# Patient Record
Sex: Female | Born: 1975 | Race: White | Hispanic: No | Marital: Married | Smoking: Never smoker
Health system: Southern US, Community
[De-identification: ages and names within clinical notes are randomized; demographics above are authoritative.]

## PROBLEM LIST (undated history)

## (undated) DIAGNOSIS — T7840XA Allergy, unspecified, initial encounter: Secondary | ICD-10-CM

## (undated) DIAGNOSIS — F419 Anxiety disorder, unspecified: Secondary | ICD-10-CM

## (undated) DIAGNOSIS — B019 Varicella without complication: Secondary | ICD-10-CM

## (undated) DIAGNOSIS — K219 Gastro-esophageal reflux disease without esophagitis: Secondary | ICD-10-CM

## (undated) HISTORY — DX: Anxiety disorder, unspecified: F41.9

## (undated) HISTORY — DX: Gastro-esophageal reflux disease without esophagitis: K21.9

## (undated) HISTORY — PX: ESOPHAGOGASTRODUODENOSCOPY (EGD) WITH PROPOFOL: SHX5813

## (undated) HISTORY — DX: Allergy, unspecified, initial encounter: T78.40XA

## (undated) HISTORY — DX: Varicella without complication: B01.9

---

## 1999-10-31 ENCOUNTER — Encounter: Payer: Self-pay | Admitting: *Deleted

## 1999-10-31 ENCOUNTER — Emergency Department (HOSPITAL_COMMUNITY): Admission: EM | Admit: 1999-10-31 | Discharge: 1999-10-31 | Payer: Self-pay | Admitting: Podiatry

## 2002-06-11 ENCOUNTER — Other Ambulatory Visit: Admission: RE | Admit: 2002-06-11 | Discharge: 2002-06-11 | Payer: Self-pay | Admitting: Family Medicine

## 2003-07-25 ENCOUNTER — Other Ambulatory Visit: Admission: RE | Admit: 2003-07-25 | Discharge: 2003-07-25 | Payer: Self-pay | Admitting: Family Medicine

## 2004-07-20 ENCOUNTER — Other Ambulatory Visit: Admission: RE | Admit: 2004-07-20 | Discharge: 2004-07-20 | Payer: Self-pay | Admitting: Family Medicine

## 2005-07-26 ENCOUNTER — Other Ambulatory Visit: Admission: RE | Admit: 2005-07-26 | Discharge: 2005-07-26 | Payer: Self-pay | Admitting: Family Medicine

## 2006-07-31 ENCOUNTER — Other Ambulatory Visit: Admission: RE | Admit: 2006-07-31 | Discharge: 2006-07-31 | Payer: Self-pay | Admitting: Family Medicine

## 2007-10-01 ENCOUNTER — Other Ambulatory Visit: Admission: RE | Admit: 2007-10-01 | Discharge: 2007-10-01 | Payer: Self-pay | Admitting: Family Medicine

## 2009-07-04 ENCOUNTER — Observation Stay: Payer: Self-pay

## 2009-07-08 ENCOUNTER — Inpatient Hospital Stay: Payer: Self-pay | Admitting: Obstetrics and Gynecology

## 2011-12-23 ENCOUNTER — Ambulatory Visit: Payer: Self-pay | Admitting: Gastroenterology

## 2011-12-26 LAB — PATHOLOGY REPORT

## 2014-01-15 ENCOUNTER — Ambulatory Visit: Payer: Self-pay | Admitting: Family Medicine

## 2014-12-09 LAB — BASIC METABOLIC PANEL
BUN: 11 mg/dL (ref 4–21)
Creatinine: 0.8 mg/dL (ref 0.5–1.1)
Glucose: 94 mg/dL
Potassium: 4.1 mmol/L (ref 3.4–5.3)
Sodium: 139 mmol/L (ref 137–147)

## 2014-12-09 LAB — HEPATIC FUNCTION PANEL
ALT: 15 U/L (ref 7–35)
AST: 19 U/L (ref 13–35)
Alkaline Phosphatase: 89 U/L (ref 25–125)
Bilirubin, Total: 0.3 mg/dL

## 2014-12-09 LAB — LIPID PANEL
Cholesterol: 154 mg/dL (ref 0–200)
HDL: 54 mg/dL (ref 35–70)
LDL Cholesterol: 86 mg/dL
Triglycerides: 70 mg/dL (ref 40–160)

## 2014-12-09 LAB — TSH: TSH: 2.39 u[IU]/mL (ref 0.41–5.90)

## 2014-12-09 LAB — HEMOGLOBIN A1C: Hgb A1c MFr Bld: 5.4 % (ref 4.0–6.0)

## 2015-06-11 ENCOUNTER — Encounter (INDEPENDENT_AMBULATORY_CARE_PROVIDER_SITE_OTHER): Payer: Self-pay

## 2015-06-11 ENCOUNTER — Encounter: Payer: Self-pay | Admitting: Nurse Practitioner

## 2015-06-11 ENCOUNTER — Ambulatory Visit (INDEPENDENT_AMBULATORY_CARE_PROVIDER_SITE_OTHER): Payer: BLUE CROSS/BLUE SHIELD | Admitting: Nurse Practitioner

## 2015-06-11 VITALS — BP 106/70 | HR 86 | Temp 98.3°F | Resp 14 | Ht 63.0 in | Wt 190.2 lb

## 2015-06-11 DIAGNOSIS — K2100 Gastro-esophageal reflux disease with esophagitis, without bleeding: Secondary | ICD-10-CM

## 2015-06-11 DIAGNOSIS — Z7189 Other specified counseling: Secondary | ICD-10-CM

## 2015-06-11 DIAGNOSIS — Z91048 Other nonmedicinal substance allergy status: Secondary | ICD-10-CM | POA: Diagnosis not present

## 2015-06-11 DIAGNOSIS — Z9109 Other allergy status, other than to drugs and biological substances: Secondary | ICD-10-CM

## 2015-06-11 DIAGNOSIS — Z7689 Persons encountering health services in other specified circumstances: Secondary | ICD-10-CM | POA: Insufficient documentation

## 2015-06-11 NOTE — Progress Notes (Signed)
Pre visit review using our clinic review tool, if applicable. No additional management support is needed unless otherwise documented below in the visit note. 

## 2015-06-11 NOTE — Patient Instructions (Signed)
See you in 6 months or if you need a physical in the next year.   Call us if you need [place] and welcome to [facility]!

## 2015-06-11 NOTE — Progress Notes (Signed)
Subjective:    Patient ID: Jaime Tate, female    DOB: 1976/02/28, 39 y.o.   MRN: [id number]  HPI  Jaime Tate is a 39 yo female establishing care. No acute CC, but following her chronic illnesses since her PCP has left practice in [place].   1) New pt info:   Immunizations- 2010 tdap  Mammogram- 12/2013 baseline normal- dense breast tissue, [facility]GM- breast cancer at age 24   Pap- Jan. 2016 no abnormal in past   Eye Exam- 4/16, normal, wears glasses for computer work  Dental Exam- UTD   LMP- 06/07/15 lasts about 4-5 days   2) Chronic Problems-  GERD- Dexilant, [facility]- endoscopy "abnormal findings of esophagus, but a variant of normal anatomy she reports".   Allergies- Zyrtec daily, sneezes a lot if misses dose  3) Acute Problems- None today and no need for refills at this time.    Review of Systems  Constitutional: Negative for fever, chills, diaphoresis and fatigue.  Respiratory: Negative for chest tightness, shortness of breath and wheezing.   Cardiovascular: Negative for chest pain, palpitations and leg swelling.  Gastrointestinal: Negative for nausea, vomiting and diarrhea.  Skin: Negative for rash.  Neurological: Negative for dizziness, weakness, numbness and headaches.  Psychiatric/Behavioral: Negative for suicidal ideas and sleep disturbance. The patient is not nervous/anxious.    Past Medical History  Diagnosis Date  . Chicken pox   . GERD (gastroesophageal reflux disease)   . Allergy     Seasonal    History   Social History  . Marital Status: Unknown    Spouse Name: N/A  . Number of Children: N/A  . Years of Education: N/A   Occupational History  . Not on file.   Social History Main Topics  . Smoking status: Never Smoker   . Smokeless tobacco: Not on file  . Alcohol Use: 0.0 oz/week    0 Standard drinks or equivalent per week     Comment: Occasionally   . Drug Use: No  . Sexual Activity:    Partners: Male    Birth Control/ Protection: Surgical   Comment: Husband- Vasectomy   Other Topics Concern  . Not on file   Social History Narrative   Works for [employer]    Lives with husband and 1 son (42 yo in August)    Pets: Cat    Coffee- 1 cup coffee, 1 occasional unsweet tea, no soda    Highest level education- Masters degree   Enjoys read, Netflix, and cross stitch     History reviewed. No pertinent past surgical history.  Family History  Problem Relation Age of Onset  . Arthritis Mother   . Heart disease Father   . Hypertension Father   . Diabetes Father   . Cancer Maternal Grandmother 67    Breast  . Cancer Paternal Grandmother     Colon    Allergies  Allergen Reactions  . Biaxin [Clarithromycin] Diarrhea and Nausea And Vomiting    No current outpatient prescriptions on file prior to visit.   No current facility-administered medications on file prior to visit.      Objective:   Physical Exam  Constitutional: She is oriented to person, place, and time. She appears well-developed and well-nourished. No distress.  BP 106/70 mmHg  Pulse 86  Temp(Src) 98.3 F (36.8 C)  Resp 14  Ht 5\' 3"  (1.6 m)  Wt 190 lb 3.2 oz (86.274 kg)  BMI 33.70 kg/m2  SpO2 98%  HENT:  Head: Normocephalic and atraumatic.  Right Ear: External ear normal.  Left Ear: External ear normal.  Eyes: EOM are normal. Pupils are equal, round, and reactive to light. Right eye exhibits no discharge. Left eye exhibits no discharge. No scleral icterus.  Neck: Normal range of motion. Neck supple. No thyromegaly present.  Cardiovascular: Normal rate, regular rhythm, normal heart sounds and intact distal pulses.  Exam reveals no gallop and no friction rub.   No murmur heard. Pulmonary/Chest: Effort normal and breath sounds normal. No respiratory distress. She has no wheezes. She has no rales. She exhibits no tenderness.  Abdominal: Soft. Bowel sounds are normal. She exhibits no distension and no mass. There is no tenderness. There is  no rebound and no guarding.  Musculoskeletal: Normal range of motion. She exhibits no edema or tenderness.  Lymphadenopathy:    She has no cervical adenopathy.  Neurological: She is alert and oriented to person, place, and time. No cranial nerve deficit. She exhibits normal muscle tone. Coordination normal.  Skin: Skin is warm and dry. No rash noted. She is not diaphoretic.  Psychiatric: She has a normal mood and affect. Her behavior is normal. Judgment and thought content normal.      Assessment & Plan:

## 2015-06-12 DIAGNOSIS — K219 Gastro-esophageal reflux disease without esophagitis: Secondary | ICD-10-CM | POA: Insufficient documentation

## 2015-06-12 DIAGNOSIS — Z9109 Other allergy status, other than to drugs and biological substances: Secondary | ICD-10-CM | POA: Insufficient documentation

## 2015-06-12 NOTE — Assessment & Plan Note (Signed)
Discussed acute and chronic issues. Reviewed health maintenance measures, PFSHx, and immunizations. Obtain records from previous facility. Pt reports physical in the 2016 year thus far.

## 2015-06-12 NOTE — Assessment & Plan Note (Deleted)
Stable. Controlled with Zyrtec OTC daily. No recent complaints and states sneezing often if missing dose.

## 2015-06-12 NOTE — Assessment & Plan Note (Signed)
Stable. Controlled with Zyrtec OTC daily. No recent complaints and states sneezing often if missing dose.

## 2015-06-12 NOTE — Assessment & Plan Note (Addendum)
Seeing GI at [facility]. Sees Stephens November, NP. She is pleased with care and is currently controlled on Dexilant daily. She recently had upper GI due to worsening GERD symptoms. No results viewable on Care Everywhere

## 2015-06-17 NOTE — Addendum Note (Signed)
Addended by: Rubbie Battiest on: 06/17/2015 03:28 PM   Modules accepted: Level of Service

## 2015-12-14 ENCOUNTER — Encounter: Payer: BLUE CROSS/BLUE SHIELD | Admitting: Nurse Practitioner

## 2016-01-01 ENCOUNTER — Encounter: Payer: BLUE CROSS/BLUE SHIELD | Admitting: Nurse Practitioner

## 2016-01-04 ENCOUNTER — Ambulatory Visit (INDEPENDENT_AMBULATORY_CARE_PROVIDER_SITE_OTHER): Payer: BLUE CROSS/BLUE SHIELD | Admitting: Nurse Practitioner

## 2016-01-04 ENCOUNTER — Encounter: Payer: Self-pay | Admitting: Nurse Practitioner

## 2016-01-04 VITALS — BP 102/62 | HR 80 | Temp 98.1°F | Resp 14 | Ht 63.0 in | Wt 200.0 lb

## 2016-01-04 DIAGNOSIS — K2100 Gastro-esophageal reflux disease with esophagitis, without bleeding: Secondary | ICD-10-CM

## 2016-01-04 DIAGNOSIS — Z23 Encounter for immunization: Secondary | ICD-10-CM

## 2016-01-04 DIAGNOSIS — F411 Generalized anxiety disorder: Secondary | ICD-10-CM | POA: Diagnosis not present

## 2016-01-04 DIAGNOSIS — Z0000 Encounter for general adult medical examination without abnormal findings: Secondary | ICD-10-CM

## 2016-01-04 DIAGNOSIS — Z0001 Encounter for general adult medical examination with abnormal findings: Secondary | ICD-10-CM

## 2016-01-04 LAB — COMPREHENSIVE METABOLIC PANEL
ALT: 15 U/L (ref 0–35)
AST: 20 U/L (ref 0–37)
Albumin: 4.3 g/dL (ref 3.5–5.2)
Alkaline Phosphatase: 77 U/L (ref 39–117)
BUN: 10 mg/dL (ref 6–23)
CO2: 26 mEq/L (ref 19–32)
Calcium: 9.3 mg/dL (ref 8.4–10.5)
Chloride: 103 mEq/L (ref 96–112)
Creatinine, Ser: 0.75 mg/dL (ref 0.40–1.20)
GFR: 91.37 mL/min (ref >60.00)
Glucose, Bld: 101 mg/dL — ABNORMAL HIGH (ref 70–99)
Potassium: 3.9 mEq/L (ref 3.5–5.1)
Sodium: 138 mEq/L (ref 135–145)
Total Bilirubin: 0.3 mg/dL (ref 0.2–1.2)
Total Protein: 7.5 g/dL (ref 6.0–8.3)

## 2016-01-04 LAB — LIPID PANEL
Cholesterol: 163 mg/dL (ref 0–200)
HDL: 50.4 mg/dL (ref >39.00)
LDL Cholesterol: 99 mg/dL (ref 0–99)
NonHDL: 112.68
Total CHOL/HDL Ratio: 3
Triglycerides: 68 mg/dL (ref 0.0–149.0)
VLDL: 13.6 mg/dL (ref 0.0–40.0)

## 2016-01-04 LAB — CBC WITH DIFFERENTIAL/PLATELET
Basophils Absolute: 0 10*3/uL (ref 0.0–0.1)
Basophils Relative: 0.5 % (ref 0.0–3.0)
Eosinophils Absolute: 0.2 10*3/uL (ref 0.0–0.7)
Eosinophils Relative: 2 % (ref 0.0–5.0)
HCT: 42.5 % (ref 36.0–46.0)
Hemoglobin: 13.8 g/dL (ref 12.0–15.0)
Lymphocytes Relative: 33.3 % (ref 12.0–46.0)
Lymphs Abs: 3.5 10*3/uL (ref 0.7–4.0)
MCHC: 32.5 g/dL (ref 30.0–36.0)
MCV: 80.9 fl (ref 78.0–100.0)
Monocytes Absolute: 0.5 10*3/uL (ref 0.1–1.0)
Monocytes Relative: 4.7 % (ref 3.0–12.0)
Neutro Abs: 6.2 10*3/uL (ref 1.4–7.7)
Neutrophils Relative %: 59.5 % (ref 43.0–77.0)
Platelets: 369 10*3/uL (ref 150.0–400.0)
RBC: 5.26 Mil/uL — ABNORMAL HIGH (ref 3.87–5.11)
RDW: 14.1 % (ref 11.5–15.5)
WBC: 10.4 10*3/uL (ref 4.0–10.5)

## 2016-01-04 LAB — HEMOGLOBIN A1C: Hgb A1c MFr Bld: 5.7 % (ref 4.6–6.5)

## 2016-01-04 MED ORDER — PANTOPRAZOLE SODIUM 40 MG PO TBEC: 40.0000 mg | DELAYED_RELEASE_TABLET | Freq: Every day | ORAL | Status: DC

## 2016-01-04 MED ORDER — ESCITALOPRAM OXALATE 10 MG PO TABS: 10.0000 mg | ORAL_TABLET | Freq: Every day | ORAL | Status: DC

## 2016-01-04 MED ORDER — BUSPIRONE HCL 5 MG PO TABS: 5.0000 mg | ORAL_TABLET | Freq: Every evening | ORAL | Status: DC | PRN

## 2016-01-04 NOTE — Assessment & Plan Note (Signed)
Discussed acute and chronic issues. Reviewed health maintenance measures, PFSHx, and immunizations. Obtain routine labs Lipid panel, CBC w/ diff, A1c, and CMET.   Flu shot- today

## 2016-01-04 NOTE — Assessment & Plan Note (Signed)
Buspirone and Lexapro were what we both decided on to try for 4 weeks. Buspirone for intermittent symptoms and Lexapro daily. FU in 4 weeks.

## 2016-01-04 NOTE — Assessment & Plan Note (Signed)
Switched from [employer] to protonix due to cost. Will follow

## 2016-01-04 NOTE — Progress Notes (Signed)
Patient ID: Jaime Tate, female    DOB: 1976/07/14  Age: 40 y.o. MRN: [id number]  CC: Panic Attack and Annual Exam   HPI Jaime Tate presents for Annual exam and CC of panic attacks.   1) Annual Physical   Diet- Eats at home   Exercise- No formal, cold outside  Immunizations-    Flu- Today    Tdap- UTD  Mammogram- N/A  Eye Exam- UTD, yearly  Dental Exam- UTD  LMP- 12/22/15  PAP- Last year with Dr. Dear, normal   Labs- [place].   Depression- Neg.  Refills: Change to Protonix   1) Dexilant is going to be too expensive, insurance states can try protonix   2) Panic attack- a few days ago, hasn't had one since [employer], very anxious over things that should not be of concern she reports. Started in November again with the stress of the election. She would like to try something low in side effects and long term, not just as needed.   History Jaime Tate has a past medical history of Chicken pox; GERD (gastroesophageal reflux disease); and Allergy.   She has no past surgical history on file.   Her family history includes Arthritis in her mother; Cancer in her paternal grandmother; Cancer (age of onset: 19) in her maternal grandmother; Diabetes in her father; Heart disease in her father; Hypertension in her father.She reports that she has never smoked. She does not have any smokeless tobacco history on file. She reports that she drinks alcohol. She reports that she does not use illicit drugs.  Outpatient Prescriptions Prior to Visit  Medication Sig Dispense Refill  . cetirizine (ZYRTEC) 10 MG tablet Take 10 mg by mouth daily.    Marland Kitchen dexlansoprazole (DEXILANT) 60 MG capsule Take 60 mg by mouth daily.     No facility-administered medications prior to visit.    ROS Review of Systems  Constitutional: Negative for fever, chills, diaphoresis, fatigue and unexpected weight change.  HENT: Negative for tinnitus and trouble swallowing.   Eyes: Negative for visual  disturbance.  Respiratory: Negative for chest tightness, shortness of breath and wheezing.   Cardiovascular: Negative for chest pain, palpitations and leg swelling.  Gastrointestinal: Negative for nausea, vomiting, abdominal pain, diarrhea, constipation and blood in stool.  Endocrine: Negative for polydipsia, polyphagia and polyuria.  Genitourinary: Negative for dysuria, hematuria, vaginal discharge and vaginal pain.  Musculoskeletal: Negative for myalgias, back pain, arthralgias and gait problem.  Skin: Negative for color change and rash.  Neurological: Negative for dizziness, weakness, numbness and headaches.  Hematological: Does not bruise/bleed easily.  Psychiatric/Behavioral: Negative for suicidal ideas and sleep disturbance. The patient is nervous/anxious.     Objective:  BP 102/62 mmHg  Pulse 80  Temp(Src) 98.1 F (36.7 C) (Oral)  Resp 14  Ht 5\' 3"  (1.6 m)  Wt 200 lb (90.719 kg)  BMI 35.44 kg/m2  SpO2 98%  LMP 12/22/2015  Physical Exam  Constitutional: She is oriented to person, place, and time. She appears well-developed and well-nourished. No distress.  HENT:  Head: Normocephalic and atraumatic.  Right Ear: External ear normal.  Left Ear: External ear normal.  Nose: Nose normal.  Mouth/Throat: Oropharynx is clear and moist. No oropharyngeal exudate.  TMs and canals clear bilaterally  Eyes: Conjunctivae and EOM are normal. Pupils are equal, round, and reactive to light. Right eye exhibits no discharge. Left eye exhibits no discharge. No scleral icterus.  Neck: Normal range of motion. Neck supple. No thyromegaly  present.  Cardiovascular: Normal rate, regular rhythm, normal heart sounds and intact distal pulses.  Exam reveals no gallop and no friction rub.   No murmur heard. Pulmonary/Chest: Effort normal and breath sounds normal. No respiratory distress. She has no wheezes. She has no rales. She exhibits no tenderness.  Breast exam performed today without significant  findings other than dense breasts  Abdominal: Soft. Bowel sounds are normal. She exhibits no distension and no mass. There is no tenderness. There is no rebound and no guarding.  Musculoskeletal: Normal range of motion. She exhibits no edema or tenderness.  Lymphadenopathy:    She has no cervical adenopathy.  Neurological: She is alert and oriented to person, place, and time. She has normal reflexes. No cranial nerve deficit. She exhibits normal muscle tone. Coordination normal.  Skin: Skin is warm and dry. No rash noted. She is not diaphoretic. No erythema. No pallor.  Psychiatric: She has a normal mood and affect. Her behavior is normal. Judgment and thought content normal.   Assessment & Plan:   Jaime Tate was seen today for panic attack and annual exam.  Diagnoses and all orders for this visit:  Routine general medical examination at a health care facility -     CBC with Differential/Platelet -     Comprehensive metabolic panel -     Hemoglobin A1c -     Lipid panel  Gastroesophageal reflux disease with esophagitis  Generalized anxiety disorder  Encounter for immunization  Other orders -     pantoprazole (PROTONIX) 40 MG tablet; Take 1 tablet (40 mg total) by mouth daily. -     escitalopram (LEXAPRO) 10 MG tablet; Take 1 tablet (10 mg total) by mouth daily. -     busPIRone (BUSPAR) 5 MG tablet; Take 1 tablet (5 mg total) by mouth at bedtime as needed. -     Flu Vaccine QUAD 36+ mos IM   I have discontinued [name]'s dexlansoprazole. I am also having her start on pantoprazole, escitalopram, and busPIRone. Additionally, I am having her maintain her cetirizine.  Meds ordered this encounter  Medications  . pantoprazole (PROTONIX) 40 MG tablet    Sig: Take 1 tablet (40 mg total) by mouth daily.    Dispense:  30 tablet    Refill:  3    Order Specific Question:  Supervising Provider    Answer:  Deborra Medina L [[id number]]  . escitalopram (LEXAPRO) 10 MG tablet    Sig: Take 1  tablet (10 mg total) by mouth daily.    Dispense:  30 tablet    Refill:  0    Order Specific Question:  Supervising Provider    Answer:  Deborra Medina L [[id number]]  . busPIRone (BUSPAR) 5 MG tablet    Sig: Take 1 tablet (5 mg total) by mouth at bedtime as needed.    Dispense:  30 tablet    Refill:  0    Order Specific Question:  Supervising Provider    Answer:  Crecencio Mc [[id number]]     Follow-up: Return in about 4 weeks (around 02/01/2016) for Follow up for medications.

## 2016-01-04 NOTE — Patient Instructions (Signed)
Follow up with me in 4 weeks to see how your medication works.   Health Maintenance, Female Adopting a healthy lifestyle and getting preventive care can go a long way to promote health and wellness. Talk with your health care provider about what schedule of regular examinations is right for you. This is a good chance for you to check in with your provider about disease prevention and staying healthy. In between checkups, there are plenty of things you can do on your own. Experts have done a lot of research about which lifestyle changes and preventive measures are most likely to keep you healthy. Ask your health care provider for more information. WEIGHT AND DIET  Eat a healthy diet  Be sure to include plenty of vegetables, fruits, low-fat dairy products, and lean protein.  Do not eat a lot of foods high in solid fats, added sugars, or salt.  Get regular exercise. This is one of the most important things you can do for your health.  Most adults should exercise for at least 150 minutes each week. The exercise should increase your heart rate and make you sweat (moderate-intensity exercise).  Most adults should also do strengthening exercises at least twice a week. This is in addition to the moderate-intensity exercise.  Maintain a healthy weight  Body mass index (BMI) is a measurement that can be used to identify possible weight problems. It estimates body fat based on height and weight. Your health care provider can help determine your BMI and help you achieve or maintain a healthy weight.  For females 26 years of age and older:   A BMI below 18.5 is considered underweight.  A BMI of 18.5 to 24.9 is normal.  A BMI of 25 to 29.9 is considered overweight.  A BMI of 30 and above is considered obese.  Watch levels of cholesterol and blood lipids  You should start having your blood tested for lipids and cholesterol at 40 years of age, then have this test every 5 years.  You Althouse need to  have your cholesterol levels checked more often if:  Your lipid or cholesterol levels are high.  You are older than 40 years of age.  You are at high risk for heart disease.  CANCER SCREENING   Lung Cancer  Lung cancer screening is recommended for adults 5-10 years old who are at high risk for lung cancer because of a history of smoking.  A yearly low-dose CT scan of the lungs is recommended for people who:  Currently smoke.  Have quit within the past 15 years.  Have at least a 30-pack-year history of smoking. A pack year is smoking an average of one pack of cigarettes a day for 1 year.  Yearly screening should continue until it has been 15 years since you quit.  Yearly screening should stop if you develop a health problem that would prevent you from having lung cancer treatment.  Breast Cancer  Practice breast self-awareness. This means understanding how your breasts normally appear and feel.  It also means doing regular breast self-exams. Let your health care provider know about any changes, no matter how small.  If you are in your 20s or 30s, you should have a clinical breast exam (CBE) by a health care provider every 1-3 years as part of a regular health exam.  If you are 51 or older, have a CBE every year. Also consider having a breast X-ray (mammogram) every year.  If you have a family history  of breast cancer, talk to your health care provider about genetic screening.  If you are at high risk for breast cancer, talk to your health care provider about having an MRI and a mammogram every year.  Breast cancer gene (BRCA) assessment is recommended for women who have family members with BRCA-related cancers. BRCA-related cancers include:  Breast.  Ovarian.  Tubal.  Peritoneal cancers.  Results of the assessment will determine the need for genetic counseling and BRCA1 and BRCA2 testing. Cervical Cancer Your health care provider Torain recommend that you be screened  regularly for cancer of the pelvic organs (ovaries, uterus, and vagina). This screening involves a pelvic examination, including checking for microscopic changes to the surface of your cervix (Pap test). You Mall be encouraged to have this screening done every 3 years, beginning at age 98.  For women ages 52-65, health care providers Lyness recommend pelvic exams and Pap testing every 3 years, or they Baranski recommend the Pap and pelvic exam, combined with testing for human papilloma virus (HPV), every 5 years. Some types of HPV increase your risk of cervical cancer. Testing for HPV Kohler also be done on women of any age with unclear Pap test results.  Other health care providers Pendell not recommend any screening for nonpregnant women who are considered low risk for pelvic cancer and who do not have symptoms. Ask your health care provider if a screening pelvic exam is right for you.  If you have had past treatment for cervical cancer or a condition that could lead to cancer, you need Pap tests and screening for cancer for at least 20 years after your treatment. If Pap tests have been discontinued, your risk factors (such as having a new sexual partner) need to be reassessed to determine if screening should resume. Some women have medical problems that increase the chance of getting cervical cancer. In these cases, your health care provider Meditz recommend more frequent screening and Pap tests. Colorectal Cancer  This type of cancer can be detected and often prevented.  Routine colorectal cancer screening usually begins at 40 years of age and continues through 40 years of age.  Your health care provider Esquivias recommend screening at an earlier age if you have risk factors for colon cancer.  Your health care provider Allender also recommend using home test kits to check for hidden blood in the stool.  A small camera at the end of a tube can be used to examine your colon directly (sigmoidoscopy or colonoscopy). This is  done to check for the earliest forms of colorectal cancer.  Routine screening usually begins at age 79.  Direct examination of the colon should be repeated every 5-10 years through 40 years of age. However, you Lucente need to be screened more often if early forms of precancerous polyps or small growths are found. Skin Cancer  Check your skin from head to toe regularly.  Tell your health care provider about any new moles or changes in moles, especially if there is a change in a mole's shape or color.  Also tell your health care provider if you have a mole that is larger than the size of a pencil eraser.  Always use sunscreen. Apply sunscreen liberally and repeatedly throughout the day.  Protect yourself by wearing long sleeves, pants, a wide-brimmed hat, and sunglasses whenever you are outside. HEART DISEASE, DIABETES, AND HIGH BLOOD PRESSURE   High blood pressure causes heart disease and increases the risk of stroke. High blood pressure is  more likely to develop in:  People who have blood pressure in the high end of the normal range (130-139/85-89 mm Hg).  People who are overweight or obese.  People who are African American.  If you are 73-61 years of age, have your blood pressure checked every 3-5 years. If you are 13 years of age or older, have your blood pressure checked every year. You should have your blood pressure measured twice--once when you are at a hospital or clinic, and once when you are not at a hospital or clinic. Record the average of the two measurements. To check your blood pressure when you are not at a hospital or clinic, you can use:  An automated blood pressure machine at a pharmacy.  A home blood pressure monitor.  If you are between 4 years and 49 years old, ask your health care provider if you should take aspirin to prevent strokes.  Have regular diabetes screenings. This involves taking a blood sample to check your fasting blood sugar level.  If you are at  a normal weight and have a low risk for diabetes, have this test once every three years after 40 years of age.  If you are overweight and have a high risk for diabetes, consider being tested at a younger age or more often. PREVENTING INFECTION  Hepatitis B  If you have a higher risk for hepatitis B, you should be screened for this virus. You are considered at high risk for hepatitis B if:  You were born in a country where hepatitis B is common. Ask your health care provider which countries are considered high risk.  Your parents were born in a high-risk country, and you have not been immunized against hepatitis B (hepatitis B vaccine).  You have HIV or AIDS.  You use needles to inject street drugs.  You live with someone who has hepatitis B.  You have had sex with someone who has hepatitis B.  You get hemodialysis treatment.  You take certain medicines for conditions, including cancer, organ transplantation, and autoimmune conditions. Hepatitis C  Blood testing is recommended for:  Everyone born from [birth year] through 1965.  Anyone with known risk factors for hepatitis C. Sexually transmitted infections (STIs)  You should be screened for sexually transmitted infections (STIs) including gonorrhea and chlamydia if:  You are sexually active and are younger than 40 years of age.  You are older than 40 years of age and your health care provider tells you that you are at risk for this type of infection.  Your sexual activity has changed since you were last screened and you are at an increased risk for chlamydia or gonorrhea. Ask your health care provider if you are at risk.  If you do not have HIV, but are at risk, it Abplanalp be recommended that you take a prescription medicine daily to prevent HIV infection. This is called pre-exposure prophylaxis (PrEP). You are considered at risk if:  You are sexually active and do not regularly use condoms or know the HIV status of your  partner(s).  You take drugs by injection.  You are sexually active with a partner who has HIV. Talk with your health care provider about whether you are at high risk of being infected with HIV. If you choose to begin PrEP, you should first be tested for HIV. You should then be tested every 3 months for as long as you are taking PrEP.  PREGNANCY   If you are premenopausal and you  Mccutchen become pregnant, ask your health care provider about preconception counseling.  If you Aldana become pregnant, take 400 to 800 micrograms (mcg) of folic acid every day.  If you want to prevent pregnancy, talk to your health care provider about birth control (contraception). OSTEOPOROSIS AND MENOPAUSE   Osteoporosis is a disease in which the bones lose minerals and strength with aging. This can result in serious bone fractures. Your risk for osteoporosis can be identified using a bone density scan.  If you are 75 years of age or older, or if you are at risk for osteoporosis and fractures, ask your health care provider if you should be screened.  Ask your health care provider whether you should take a calcium or vitamin D supplement to lower your risk for osteoporosis.  Menopause Clausing have certain physical symptoms and risks.  Hormone replacement therapy Pequignot reduce some of these symptoms and risks. Talk to your health care provider about whether hormone replacement therapy is right for you.  HOME CARE INSTRUCTIONS   Schedule regular health, dental, and eye exams.  Stay current with your immunizations.   Do not use any tobacco products including cigarettes, chewing tobacco, or electronic cigarettes.  If you are pregnant, do not drink alcohol.  If you are breastfeeding, limit how much and how often you drink alcohol.  Limit alcohol intake to no more than 1 drink per day for nonpregnant women. One drink equals 12 ounces of beer, 5 ounces of wine, or 1 ounces of hard liquor.  Do not use street drugs.  Do  not share needles.  Ask your health care provider for help if you need support or information about quitting drugs.  Tell your health care provider if you often feel depressed.  Tell your health care provider if you have ever been abused or do not feel safe at home.   This information is not intended to replace advice given to you by your health care provider. Make sure you discuss any questions you have with your health care provider.   Document Released: 06/06/2011 Document Revised: 12/12/2014 Document Reviewed: 10/23/2013 Elsevier Interactive Patient Education [employer].

## 2016-01-07 ENCOUNTER — Telehealth: Payer: Self-pay | Admitting: Nurse Practitioner

## 2016-01-07 NOTE — Telephone Encounter (Signed)
If patient comes in she needs to sign a medical release form.

## 2016-01-13 ENCOUNTER — Telehealth: Payer: Self-pay

## 2016-01-13 NOTE — Telephone Encounter (Signed)
Will place form up front for patient to sign so that we are able to fax if she still wants [place] too.

## 2016-01-13 NOTE — Telephone Encounter (Signed)
Its either in the red folder or one of those folders- I left it there in case she came by, lol. Call me if you can't find it.

## 2016-01-13 NOTE — Telephone Encounter (Signed)
Jaime Tate this patient left a form for you to fill out and you needed a lab to be able to complete it. You also needed her signature. Was wondering where that form was located on your desk or office space.

## 2016-01-13 NOTE — Telephone Encounter (Signed)
Pt called and stated that she was waiting on a call from [facility] about paperwork the she gave Lorane Gell to fill out for her insurance , also does she need to sign anything else. Do you know what she asking for Ashleigh?

## 2016-01-31 ENCOUNTER — Other Ambulatory Visit: Payer: Self-pay | Admitting: Nurse Practitioner

## 2016-02-02 ENCOUNTER — Other Ambulatory Visit: Payer: Self-pay

## 2016-02-02 ENCOUNTER — Telehealth: Payer: Self-pay

## 2016-02-02 MED ORDER — BUSPIRONE HCL 5 MG PO TABS: 5.0000 mg | ORAL_TABLET | Freq: Every evening | ORAL | Status: DC | PRN

## 2016-02-02 MED ORDER — ESCITALOPRAM OXALATE 10 MG PO TABS: ORAL_TABLET | ORAL | Status: DC

## 2016-02-02 NOTE — Telephone Encounter (Signed)
Pharmacy called about getting a refill on buspar for pt with a 90 day refill.

## 2016-02-04 ENCOUNTER — Encounter: Payer: Self-pay | Admitting: Nurse Practitioner

## 2016-02-04 ENCOUNTER — Ambulatory Visit (INDEPENDENT_AMBULATORY_CARE_PROVIDER_SITE_OTHER): Payer: BLUE CROSS/BLUE SHIELD | Admitting: Nurse Practitioner

## 2016-02-04 ENCOUNTER — Other Ambulatory Visit: Payer: Self-pay | Admitting: Surgical

## 2016-02-04 VITALS — BP 110/68 | HR 68 | Temp 98.4°F | Resp 14 | Ht 63.0 in | Wt 200.6 lb

## 2016-02-04 DIAGNOSIS — F411 Generalized anxiety disorder: Secondary | ICD-10-CM | POA: Diagnosis not present

## 2016-02-04 MED ORDER — PANTOPRAZOLE SODIUM 40 MG PO TBEC: 40.0000 mg | DELAYED_RELEASE_TABLET | Freq: Every day | ORAL | Status: DC

## 2016-02-04 NOTE — Assessment & Plan Note (Signed)
Improving Doing very well on Lexapro Uses buspar sparingly at night  Congratulated her on feeling improved  FU prn worsening/failure to improve.

## 2016-02-04 NOTE — Progress Notes (Signed)
Patient ID: Jaime Tate, female    DOB: 1976/10/05  Age: 40 y.o. MRN: [id number]  CC: Follow-up   HPI [name] presents for follow up of anxiety and medication follow up.    1) Used Buspar once before sleeping and it was helped   2) Lexapro- no recent panic attacks and feels happier recently  History Jaime Tate has a past medical history of Chicken pox; GERD (gastroesophageal reflux disease); and Allergy.   She has no past surgical history on file.   Her family history includes Arthritis in her mother; Cancer in her paternal grandmother; Cancer (age of onset: 43) in her maternal grandmother; Diabetes in her father; Heart disease in her father; Hypertension in her father.She reports that she has never smoked. She does not have any smokeless tobacco history on file. She reports that she drinks alcohol. She reports that she does not use illicit drugs.  Outpatient Prescriptions Prior to Visit  Medication Sig Dispense Refill  . busPIRone (BUSPAR) 5 MG tablet Take 1 tablet (5 mg total) by mouth at bedtime as needed. 90 tablet 2  . cetirizine (ZYRTEC) 10 MG tablet Take 10 mg by mouth daily.    Marland Kitchen escitalopram (LEXAPRO) 10 MG tablet TAKE 1 TABLET (10 MG TOTAL) BY MOUTH DAILY. 90 tablet 0  . pantoprazole (PROTONIX) 40 MG tablet Take 1 tablet (40 mg total) by mouth daily. 30 tablet 3   No facility-administered medications prior to visit.    ROS Review of Systems  Constitutional: Negative for fever, chills, diaphoresis and fatigue.  Respiratory: Negative for chest tightness, shortness of breath and wheezing.   Cardiovascular: Negative for chest pain, palpitations and leg swelling.  Gastrointestinal: Negative for nausea, vomiting and diarrhea.  Skin: Negative for rash.  Neurological: Negative for dizziness, weakness, numbness and headaches.  Psychiatric/Behavioral: The patient is not nervous/anxious.     Objective:  BP 110/68 mmHg  Pulse 68  Temp(Src) 98.4 F (36.9 C) (Oral)   Resp 14  Ht 5\' 3"  (1.6 m)  Wt 200 lb 9.6 oz (90.992 kg)  BMI 35.54 kg/m2  SpO2 97%  LMP 01/06/2016  Physical Exam  Constitutional: She is oriented to person, place, and time. She appears well-developed and well-nourished. No distress.  HENT:  Head: Normocephalic and atraumatic.  Right Ear: External ear normal.  Left Ear: External ear normal.  Cardiovascular: Normal rate, regular rhythm and normal heart sounds.  Exam reveals no gallop and no friction rub.   No murmur heard. Pulmonary/Chest: Effort normal and breath sounds normal. No respiratory distress. She has no wheezes. She has no rales. She exhibits no tenderness.  Neurological: She is alert and oriented to person, place, and time. No cranial nerve deficit. She exhibits normal muscle tone. Coordination normal.  Skin: Skin is warm and dry. No rash noted. She is not diaphoretic.  Psychiatric: She has a normal mood and affect. Her behavior is normal. Judgment and thought content normal.   Assessment & Plan:   Tashanda was seen today for follow-up.  Diagnoses and all orders for this visit:  Generalized anxiety disorder  I am having [name] maintain her cetirizine, pantoprazole, escitalopram, and busPIRone.  No orders of the defined types were placed in this encounter.     Follow-up: Return if symptoms worsen or fail to improve.

## 2016-02-04 NOTE — Patient Instructions (Addendum)
I will see you when you need me!

## 2016-02-04 NOTE — Telephone Encounter (Signed)
Pharmacy requesting 90 day supply

## 2016-02-29 ENCOUNTER — Other Ambulatory Visit: Payer: Self-pay | Admitting: Nurse Practitioner

## 2016-05-30 ENCOUNTER — Other Ambulatory Visit: Payer: Self-pay | Admitting: Nurse Practitioner

## 2016-05-31 NOTE — Telephone Encounter (Signed)
Pt is requesting a refill last filled on 02/02/16 #90 last OV 02/04/16. Ok to refill?

## 2016-06-01 ENCOUNTER — Telehealth: Payer: Self-pay | Admitting: *Deleted

## 2016-06-01 NOTE — Telephone Encounter (Signed)
Patient has requested a medication refill for her escitalopram  Pharmacy [facility] in [place]

## 2016-06-01 NOTE — Telephone Encounter (Signed)
This was refilled this morning. thanks

## 2016-08-29 ENCOUNTER — Other Ambulatory Visit: Payer: Self-pay | Admitting: Family Medicine

## 2016-08-29 NOTE — Telephone Encounter (Signed)
Your are listed has PCP but pt has not been seen by you. Dr.Cook refilled on 06/01/16. Please advise?

## 2016-08-29 NOTE — Telephone Encounter (Signed)
Refill given. Patient needs follow-up appointment for further refills.

## 2016-08-30 NOTE — Telephone Encounter (Signed)
Scheduled on 09/12/16 at 9 a.m with Vidal Schwalbe, NP

## 2016-09-12 ENCOUNTER — Ambulatory Visit (INDEPENDENT_AMBULATORY_CARE_PROVIDER_SITE_OTHER): Payer: BLUE CROSS/BLUE SHIELD | Admitting: Family

## 2016-09-12 ENCOUNTER — Encounter: Payer: Self-pay | Admitting: Family

## 2016-09-12 ENCOUNTER — Encounter (INDEPENDENT_AMBULATORY_CARE_PROVIDER_SITE_OTHER): Payer: Self-pay

## 2016-09-12 VITALS — BP 118/76 | HR 88 | Temp 98.3°F | Ht 63.0 in | Wt 211.6 lb

## 2016-09-12 DIAGNOSIS — Z23 Encounter for immunization: Secondary | ICD-10-CM

## 2016-09-12 DIAGNOSIS — M5431 Sciatica, right side: Secondary | ICD-10-CM | POA: Diagnosis not present

## 2016-09-12 DIAGNOSIS — F411 Generalized anxiety disorder: Secondary | ICD-10-CM

## 2016-09-12 MED ORDER — PREDNISONE 10 MG PO TABS: ORAL_TABLET | ORAL | 0 refills | Status: DC

## 2016-09-12 MED ORDER — CYCLOBENZAPRINE HCL 10 MG PO TABS: 10.0000 mg | ORAL_TABLET | Freq: Every evening | ORAL | 0 refills | Status: DC | PRN

## 2016-09-12 NOTE — Assessment & Plan Note (Addendum)
Symptoms and patient history consistent with sciatica.Trial of prednisone and Flexeril at bedtime. Exercises given to patient. If no improvement, we discussed a referral to physical therapy.

## 2016-09-12 NOTE — Patient Instructions (Signed)
Trial of prednisone and Flexeril at bedtime.  If there is no improvement in your symptoms, or if there is any worsening of symptoms, or if you have any additional concerns, please return for re-evaluation; or, if we are closed, consider going to the Emergency Room for evaluation if symptoms urgent.   Sciatica With Rehab The sciatic nerve runs from the back down the leg and is responsible for sensation and control of the muscles in the back (posterior) side of the thigh, lower leg, and foot. Sciatica is a condition that is characterized by inflammation of this nerve.  SYMPTOMS   Signs of nerve damage, including numbness and/or weakness along the posterior side of the lower extremity.  Pain in the back of the thigh that Shave also travel down the leg.  Pain that worsens when sitting for long periods of time.  Occasionally, pain in the back or buttock. CAUSES  Inflammation of the sciatic nerve is the cause of sciatica. The inflammation is due to something irritating the nerve. Common sources of irritation include:  Sitting for long periods of time.  Direct trauma to the nerve.  Arthritis of the spine.  Herniated or ruptured disk.  Slipping of the vertebrae (spondylolisthesis).  Pressure from soft tissues, such as muscles or ligament-like tissue (fascia). RISK INCREASES WITH:  Sports that place pressure or stress on the spine (football or weightlifting).  Poor strength and flexibility.  Failure to warm up properly before activity.  Family history of low back pain or disk disorders.  Previous back injury or surgery.  Poor body mechanics, especially when lifting, or poor posture. PREVENTION   Warm up and stretch properly before activity.  Maintain physical fitness:  Strength, flexibility, and endurance.  Cardiovascular fitness.  Learn and use proper technique, especially with posture and lifting. When possible, have coach correct improper technique.  Avoid activities  that place stress on the spine. PROGNOSIS If treated properly, then sciatica usually resolves within 6 weeks. However, occasionally surgery is necessary.  RELATED COMPLICATIONS   Permanent nerve damage, including pain, numbness, tingle, or weakness.  Chronic back pain.  Risks of surgery: infection, bleeding, nerve damage, or damage to surrounding tissues. TREATMENT Treatment initially involves resting from any activities that aggravate your symptoms. The use of ice and medication Macinnes help reduce pain and inflammation. The use of strengthening and stretching exercises Gosse help reduce pain with activity. These exercises Hargett be performed at home or with referral to a therapist. A therapist Starlin recommend further treatments, such as transcutaneous electronic nerve stimulation (TENS) or ultrasound. Your caregiver Melching recommend corticosteroid injections to help reduce inflammation of the sciatic nerve. If symptoms persist despite non-surgical (conservative) treatment, then surgery Lamar be recommended. MEDICATION  If pain medication is necessary, then nonsteroidal anti-inflammatory medications, such as aspirin and ibuprofen, or other minor pain relievers, such as acetaminophen, are often recommended.  Do not take pain medication for 7 days before surgery.  Prescription pain relievers Appleton be given if deemed necessary by your caregiver. Use only as directed and only as much as you need.  Ointments applied to the skin Riechers be helpful.  Corticosteroid injections Seybold be given by your caregiver. These injections should be reserved for the most serious cases, because they Abdelrahman only be given a certain number of times. HEAT AND COLD  Cold treatment (icing) relieves pain and reduces inflammation. Cold treatment should be applied for 10 to 15 minutes every 2 to 3 hours for inflammation and pain and immediately after any  activity that aggravates your symptoms. Use ice packs or massage the area with a piece of ice  (ice massage).  Heat treatment Sales be used prior to performing the stretching and strengthening activities prescribed by your caregiver, physical therapist, or athletic trainer. Use a heat pack or soak the injury in warm water. SEEK MEDICAL CARE IF:  Treatment seems to offer no benefit, or the condition worsens.  Any medications produce adverse side effects. EXERCISES  RANGE OF MOTION (ROM) AND STRETCHING EXERCISES - Sciatica Most people with sciatic will find that their symptoms worsen with either excessive bending forward (flexion) or arching at the low back (extension). The exercises which will help resolve your symptoms will focus on the opposite motion. Your physician, physical therapist or athletic trainer will help you determine which exercises will be most helpful to resolve your low back pain. Do not complete any exercises without first consulting with your clinician. Discontinue any exercises which worsen your symptoms until you speak to your clinician. If you have pain, numbness or tingling which travels down into your buttocks, leg or foot, the goal of the therapy is for these symptoms to move closer to your back and eventually resolve. Occasionally, these leg symptoms will get better, but your low back pain Propes worsen; this is typically an indication of progress in your rehabilitation. Be certain to be very alert to any changes in your symptoms and the activities in which you participated in the 24 hours prior to the change. Sharing this information with your clinician will allow him/her to most efficiently treat your condition. These exercises Mccrone help you when beginning to rehabilitate your injury. Your symptoms Hurd resolve with or without further involvement from your physician, physical therapist or athletic trainer. While completing these exercises, remember:   Restoring tissue flexibility helps normal motion to return to the joints. This allows healthier, less painful movement and  activity.  An effective stretch should be held for at least 30 seconds.  A stretch should never be painful. You should only feel a gentle lengthening or release in the stretched tissue. FLEXION RANGE OF MOTION AND STRETCHING EXERCISES: STRETCH - Flexion, Single Knee to Chest   Lie on a firm bed or floor with both legs extended in front of you.  Keeping one leg in contact with the floor, bring your opposite knee to your chest. Hold your leg in place by either grabbing behind your thigh or at your knee.  Pull until you feel a gentle stretch in your low back. Hold __________ seconds.  Slowly release your grasp and repeat the exercise with the opposite side. Repeat __________ times. Complete this exercise __________ times per day.  STRETCH - Flexion, Double Knee to Chest  Lie on a firm bed or floor with both legs extended in front of you.  Keeping one leg in contact with the floor, bring your opposite knee to your chest.  Tense your stomach muscles to support your back and then lift your other knee to your chest. Hold your legs in place by either grabbing behind your thighs or at your knees.  Pull both knees toward your chest until you feel a gentle stretch in your low back. Hold __________ seconds.  Tense your stomach muscles and slowly return one leg at a time to the floor. Repeat __________ times. Complete this exercise __________ times per day.  STRETCH - Low Trunk Rotation   Lie on a firm bed or floor. Keeping your legs in front of you, bend  your knees so they are both pointed toward the ceiling and your feet are flat on the floor.  Extend your arms out to the side. This will stabilize your upper body by keeping your shoulders in contact with the floor.  Gently and slowly drop both knees together to one side until you feel a gentle stretch in your low back. Hold for __________ seconds.  Tense your stomach muscles to support your low back as you bring your knees back to the  starting position. Repeat the exercise to the other side. Repeat __________ times. Complete this exercise __________ times per day  EXTENSION RANGE OF MOTION AND FLEXIBILITY EXERCISES: STRETCH - Extension, Prone on Elbows  Lie on your stomach on the floor, a bed will be too soft. Place your palms about shoulder width apart and at the height of your head.  Place your elbows under your shoulders. If this is too painful, stack pillows under your chest.  Allow your body to relax so that your hips drop lower and make contact more completely with the floor.  Hold this position for __________ seconds.  Slowly return to lying flat on the floor. Repeat __________ times. Complete this exercise __________ times per day.  RANGE OF MOTION - Extension, Prone Press Ups  Lie on your stomach on the floor, a bed will be too soft. Place your palms about shoulder width apart and at the height of your head.  Keeping your back as relaxed as possible, slowly straighten your elbows while keeping your hips on the floor. You [place] adjust the placement of your hands to maximize your comfort. As you gain motion, your hands will come more underneath your shoulders.  Hold this position __________ seconds.  Slowly return to lying flat on the floor. Repeat __________ times. Complete this exercise __________ times per day.  STRENGTHENING EXERCISES - Sciatica  These exercises Bardon help you when beginning to rehabilitate your injury. These exercises should be done near your "sweet spot." This is the neutral, low-back arch, somewhere between fully rounded and fully arched, that is your least painful position. When performed in this safe range of motion, these exercises can be used for people who have either a flexion or extension based injury. These exercises Vandevoort resolve your symptoms with or without further involvement from your physician, physical therapist or athletic trainer. While completing these exercises, remember:    Muscles can gain both the endurance and the strength needed for everyday activities through controlled exercises.  Complete these exercises as instructed by your physician, physical therapist or athletic trainer. Progress with the resistance and repetition exercises only as your caregiver advises.  You Radwan experience muscle soreness or fatigue, but the pain or discomfort you are trying to eliminate should never worsen during these exercises. If this pain does worsen, stop and make certain you are following the directions exactly. If the pain is still present after adjustments, discontinue the exercise until you can discuss the trouble with your clinician. STRENGTHENING - Deep Abdominals, Pelvic Tilt   Lie on a firm bed or floor. Keeping your legs in front of you, bend your knees so they are both pointed toward the ceiling and your feet are flat on the floor.  Tense your lower abdominal muscles to press your low back into the floor. This motion will rotate your pelvis so that your tail bone is scooping upwards rather than pointing at your feet or into the floor.  With a gentle tension and even breathing, hold this  position for __________ seconds. Repeat __________ times. Complete this exercise __________ times per day.  STRENGTHENING - Abdominals, Crunches   Lie on a firm bed or floor. Keeping your legs in front of you, bend your knees so they are both pointed toward the ceiling and your feet are flat on the floor. Cross your arms over your chest.  Slightly tip your chin down without bending your neck.  Tense your abdominals and slowly lift your trunk high enough to just clear your shoulder blades. Lifting higher can put excessive stress on the low back and does not further strengthen your abdominal muscles.  Control your return to the starting position. Repeat __________ times. Complete this exercise __________ times per day.  STRENGTHENING - Quadruped, Opposite UE/LE Lift  Assume a  hands and knees position on a firm surface. Keep your hands under your shoulders and your knees under your hips. You Herringshaw place padding under your knees for comfort.  Find your neutral spine and gently tense your abdominal muscles so that you can maintain this position. Your shoulders and hips should form a rectangle that is parallel with the floor and is not twisted.  Keeping your trunk steady, lift your right hand no higher than your shoulder and then your left leg no higher than your hip. Make sure you are not holding your breath. Hold this position __________ seconds.  Continuing to keep your abdominal muscles tense and your back steady, slowly return to your starting position. Repeat with the opposite arm and leg. Repeat __________ times. Complete this exercise __________ times per day.  STRENGTHENING - Abdominals and Quadriceps, Straight Leg Raise   Lie on a firm bed or floor with both legs extended in front of you.  Keeping one leg in contact with the floor, bend the other knee so that your foot can rest flat on the floor.  Find your neutral spine, and tense your abdominal muscles to maintain your spinal position throughout the exercise.  Slowly lift your straight leg off the floor about 6 inches for a count of 15, making sure to not hold your breath.  Still keeping your neutral spine, slowly lower your leg all the way to the floor. Repeat this exercise with each leg __________ times. Complete this exercise __________ times per day. POSTURE AND BODY MECHANICS CONSIDERATIONS - Sciatica Keeping correct posture when sitting, standing or completing your activities will reduce the stress put on different body tissues, allowing injured tissues a chance to heal and limiting painful experiences. The following are general guidelines for improved posture. Your physician or physical therapist will provide you with any instructions specific to your needs. While reading these guidelines,  remember:  The exercises prescribed by your provider will help you have the flexibility and strength to maintain correct postures.  The correct posture provides the optimal environment for your joints to work. All of your joints have less wear and tear when properly supported by a spine with good posture. This means you will experience a healthier, less painful body.  Correct posture must be practiced with all of your activities, especially prolonged sitting and standing. Correct posture is as important when doing repetitive low-stress activities (typing) as it is when doing a single heavy-load activity (lifting). RESTING POSITIONS Consider which positions are most painful for you when choosing a resting position. If you have pain with flexion-based activities (sitting, bending, stooping, squatting), choose a position that allows you to rest in a less flexed posture. You would want to avoid  curling into a fetal position on your side. If your pain worsens with extension-based activities (prolonged standing, working overhead), avoid resting in an extended position such as sleeping on your stomach. Most people will find more comfort when they rest with their spine in a more neutral position, neither too rounded nor too arched. Lying on a non-sagging bed on your side with a pillow between your knees, or on your back with a pillow under your knees will often provide some relief. Keep in mind, being in any one position for a prolonged period of time, no matter how correct your posture, can still lead to stiffness. PROPER SITTING POSTURE In order to minimize stress and discomfort on your spine, you must sit with correct posture Sitting with good posture should be effortless for a healthy body. Returning to good posture is a gradual process. Many people can work toward this most comfortably by using various supports until they have the flexibility and strength to maintain this posture on their own. When sitting  with proper posture, your ears will fall over your shoulders and your shoulders will fall over your hips. You should use the back of the chair to support your upper back. Your low back will be in a neutral position, just slightly arched. You Cressler place a small pillow or folded towel at the base of your low back for support.  When working at a desk, create an environment that supports good, upright posture. Without extra support, muscles fatigue and lead to excessive strain on joints and other tissues. Keep these recommendations in mind: CHAIR:   A chair should be able to slide under your desk when your back makes contact with the back of the chair. This allows you to work closely.  The chair's height should allow your eyes to be level with the upper part of your monitor and your hands to be slightly lower than your elbows. BODY POSITION  Your feet should make contact with the floor. If this is not possible, use a foot rest.  Keep your ears over your shoulders. This will reduce stress on your neck and low back. INCORRECT SITTING POSTURES   If you are feeling tired and unable to assume a healthy sitting posture, do not slouch or slump. This puts excessive strain on your back tissues, causing more damage and pain. Healthier options include:  Using more support, like a lumbar pillow.  Switching tasks to something that requires you to be upright or walking.  Talking a brief walk.  Lying down to rest in a neutral-spine position. PROLONGED STANDING WHILE SLIGHTLY LEANING FORWARD  When completing a task that requires you to lean forward while standing in one place for a long time, place either foot up on a stationary 2-4 inch high object to help maintain the best posture. When both feet are on the ground, the low back tends to lose its slight inward curve. If this curve flattens (or becomes too large), then the back and your other joints will experience too much stress, fatigue more quickly and can  cause pain.  CORRECT STANDING POSTURES Proper standing posture should be assumed with all daily activities, even if they only take a few moments, like when brushing your teeth. As in sitting, your ears should fall over your shoulders and your shoulders should fall over your hips. You should keep a slight tension in your abdominal muscles to brace your spine. Your tailbone should point down to the ground, not behind your body, resulting in  an over-extended swayback posture.  INCORRECT STANDING POSTURES  Common incorrect standing postures include a forward head, locked knees and/or an excessive swayback. WALKING Walk with an upright posture. Your ears, shoulders and hips should all line-up. PROLONGED ACTIVITY IN A FLEXED POSITION When completing a task that requires you to bend forward at your waist or lean over a low surface, try to find a way to stabilize 3 of 4 of your limbs. You can place a hand or elbow on your thigh or rest a knee on the surface you are reaching across. This will provide you more stability so that your muscles do not fatigue as quickly. By keeping your knees relaxed, or slightly bent, you will also reduce stress across your low back. CORRECT LIFTING TECHNIQUES DO :   Assume a wide stance. This will provide you more stability and the opportunity to get as close as possible to the object which you are lifting.  Tense your abdominals to brace your spine; then bend at the knees and hips. Keeping your back locked in a neutral-spine position, lift using your leg muscles. Lift with your legs, keeping your back straight.  Test the weight of unknown objects before attempting to lift them.  Try to keep your elbows locked down at your sides in order get the best strength from your shoulders when carrying an object.  Always ask for help when lifting heavy or awkward objects. INCORRECT LIFTING TECHNIQUES DO NOT:   Lock your knees when lifting, even if it is a small object.  Bend and  twist. Pivot at your feet or move your feet when needing to change directions.  Assume that you cannot safely pick up a paperclip without proper posture.   This information is not intended to replace advice given to you by your health care provider. Make sure you discuss any questions you have with your health care provider.   Document Released: 11/21/2005 Document Revised: 04/07/2015 Document Reviewed: 03/05/2009 [employer] Interactive Patient Education [employer].

## 2016-09-12 NOTE — Progress Notes (Signed)
Subjective:    Patient ID: Jaime Tate, female    DOB: Dec 25, 1975, 40 y.o.   MRN: [id number]  CC: Jaime Tate is a 40 y.o. female who presents today for follow up.   HPI: Patient here for follow up.   Anxiety- stable on lexapro. Has gained 15 pounds while on medication. Very rarely takes buspar. No more panic attacks. No thoughts of hurting herself or anyone else.  Sciatica-  Started over last several months, worsening. Initially had only hurt at night when laying down. Now aggravated by sitting too long or even walking too long. Strong family history of sciatica. Struggles to work out due to pain. Sharp pain right sided posterior buttock to right ankle. Cannot sleep on right side. 'Walking it out' helps pain. She also takes two naproxen qhs and one in the morning. No h/o GIB. No urinary incontinence, falls,  H/o cancer.  u    HISTORY:  Past Medical History:  Diagnosis Date  . Allergy    Seasonal  . Chicken pox   . GERD (gastroesophageal reflux disease)    History reviewed. No pertinent surgical history. Family History  Problem Relation Age of Onset  . Arthritis Mother   . Heart disease Father   . Hypertension Father   . Diabetes Father   . Cancer Maternal Grandmother 3    Breast  . Cancer Paternal Grandmother     Colon    Allergies: Biaxin [clarithromycin] Current Outpatient Prescriptions on File Prior to Visit  Medication Sig Dispense Refill  . busPIRone (BUSPAR) 5 MG tablet Take 1 tablet (5 mg total) by mouth at bedtime as needed. 90 tablet 2  . cetirizine (ZYRTEC) 10 MG tablet Take 10 mg by mouth daily.    Marland Kitchen escitalopram (LEXAPRO) 10 MG tablet TAKE 1 TABLET (10 MG TOTAL) BY MOUTH DAILY. 90 tablet 0  . pantoprazole (PROTONIX) 40 MG tablet Take 1 tablet (40 mg total) by mouth daily. 90 tablet 2   No current facility-administered medications on file prior to visit.     Social History  Substance Use Topics  . Smoking status: Never Smoker  . Smokeless  tobacco: Never Used  . Alcohol use 0.0 oz/week     Comment: Occasionally     Review of Systems  Constitutional: Negative for chills and fever.  Respiratory: Negative for cough.   Cardiovascular: Negative for chest pain and palpitations.  Gastrointestinal: Negative for abdominal pain, nausea and vomiting.  Genitourinary: Negative for dysuria.  Musculoskeletal: Positive for back pain. Negative for arthralgias.  Neurological: Negative for headaches.      Objective:    BP 118/76   Pulse 88   Temp 98.3 F (36.8 C) (Oral)   Ht 5\' 3"  (1.6 m)   Wt 211 lb 9.6 oz (96 kg)   LMP 08/27/2016   SpO2 98%   BMI 37.48 kg/m  BP Readings from Last 3 Encounters:  09/12/16 118/76  02/04/16 110/68  01/04/16 102/62   Wt Readings from Last 3 Encounters:  09/12/16 211 lb 9.6 oz (96 kg)  02/04/16 200 lb 9.6 oz (91 kg)  01/04/16 200 lb (90.7 kg)    Physical Exam  Constitutional: She appears well-developed and well-nourished.  Eyes: Conjunctivae are normal.  Cardiovascular: Normal rate, regular rhythm, normal heart sounds and normal pulses.   Pulmonary/Chest: Effort normal and breath sounds normal. She has no wheezes. She has no rhonchi. She has no rales.  Musculoskeletal:       Lumbar back: She  exhibits normal range of motion, no tenderness, no bony tenderness, no swelling, no edema, no pain and no spasm.  Full range of motion with flexion, tension, lateral side bends. No bony tenderness. No pain, numbness, tingling elicited with single leg raise bilaterally.   Neurological: She is alert. She has normal strength. No sensory deficit.  Reflex Scores:      Patellar reflexes are 2+ on the right side and 2+ on the left side. Sensation intact bilateral lower extremities. 4/5 strength RLE,. 5/5 strength LLE.   Skin: Skin is warm and dry.  Psychiatric: She has a normal mood and affect. Her speech is normal and behavior is normal. Thought content normal.  Vitals reviewed.      Assessment & Plan:     Problem List Items Addressed This Visit      Nervous and Auditory   Sciatica of right side - Primary    Trial of prednisone and Flexeril at bedtime. Exercises given to patient. If no improvement, we discussed a referral to physical therapy.      Relevant Medications   cyclobenzaprine (FLEXERIL) 10 MG tablet   predniSONE (DELTASONE) 10 MG tablet     Other   Generalized anxiety disorder    Stable. Continue current regimen.       Other Visit Diagnoses    Encounter for immunization       Relevant Orders   Flu Vaccine QUAD 36+ mos IM (Completed)       I am having Jaime Tate start on cyclobenzaprine and predniSONE. I am also having her maintain her cetirizine, busPIRone, pantoprazole, and escitalopram.   Meds ordered this encounter  Medications  . cyclobenzaprine (FLEXERIL) 10 MG tablet    Sig: Take 1 tablet (10 mg total) by mouth at bedtime as needed for muscle spasms.    Dispense:  14 tablet    Refill:  0    Order Specific Question:   Supervising Provider    Answer:   Deborra Medina L [[id number]]  . predniSONE (DELTASONE) 10 MG tablet    Sig: Take 4 tablets ( total 40 mg) by mouth for 2 days; take 3 tablets ( total 30 mg) by mouth for 2 days; take 2 tablets ( total 20 mg) by mouth for 1 day; take 1 tablet ( total 10 mg) by mouth for 1 day.    Dispense:  17 tablet    Refill:  0    Order Specific Question:   Supervising Provider    Answer:   Crecencio Mc [[id number]]    Return precautions given.   Risks, benefits, and alternatives of the medications and treatment plan prescribed today were discussed, and patient expressed understanding.   Education regarding symptom management and diagnosis given to patient on AVS.  Continue to follow with Mable Paris, FNP for routine health maintenance.   Abbott Pao Zinger and I agreed with plan.   Mable Paris, FNP

## 2016-09-12 NOTE — Assessment & Plan Note (Signed)
Stable  Continue current regimen  

## 2016-10-24 ENCOUNTER — Ambulatory Visit (INDEPENDENT_AMBULATORY_CARE_PROVIDER_SITE_OTHER): Payer: BLUE CROSS/BLUE SHIELD | Admitting: Family

## 2016-10-24 ENCOUNTER — Encounter: Payer: Self-pay | Admitting: Family

## 2016-10-24 VITALS — BP 128/80 | HR 102 | Temp 98.3°F | Ht 63.5 in | Wt 215.5 lb

## 2016-10-24 DIAGNOSIS — M5431 Sciatica, right side: Secondary | ICD-10-CM | POA: Diagnosis not present

## 2016-10-24 MED ORDER — GABAPENTIN 100 MG PO CAPS: 300.0000 mg | ORAL_CAPSULE | Freq: Every day | ORAL | 1 refills | Status: DC

## 2016-10-24 NOTE — Patient Instructions (Signed)
Trial of gabapentin as discussed.  take  100 mg for couple of nights in a row. You Alverson titrate up to 200 mg at night for a couple of nights and then 300 mg at night and stay there. You Curley also find that you need to take one capsule in the morning or midday. Just be mindful of becoming sleepy on this medication.   If there is no improvement in your symptoms, or if there is any worsening of symptoms, or if you have any additional concerns, please return for re-evaluation; or, if we are closed, consider going to the Emergency Room for evaluation if symptoms urgent.

## 2016-10-24 NOTE — Progress Notes (Signed)
Subjective:    Patient ID: Jaime Tate, female    DOB: 08/08/1976, 40 y.o.   MRN: [id number]  CC: Jaime Tate is a 40 y.o. female who presents today for follow up.   HPI: Follow up CC : low back pain with sciatica to right leg for 6 months, worsening. Seen one month ago and trial of flexeril, exercises, and prednisone with temporary relief. Shooting pain and numbness, tingling from buttocks to last 3 right toes. Now aggravated by walking; used to be after sitting. No urinary incontinence, LE weakness, history of cancer.    HISTORY:  Past Medical History:  Diagnosis Date  . Allergy    Seasonal  . Chicken pox   . GERD (gastroesophageal reflux disease)    History reviewed. No pertinent surgical history. Family History  Problem Relation Age of Onset  . Arthritis Mother   . Heart disease Father   . Hypertension Father   . Diabetes Father   . Cancer Maternal Grandmother 19    Breast  . Cancer Paternal Grandmother     Colon    Allergies: Biaxin [clarithromycin] Current Outpatient Prescriptions on File Prior to Visit  Medication Sig Dispense Refill  . busPIRone (BUSPAR) 5 MG tablet Take 1 tablet (5 mg total) by mouth at bedtime as needed. 90 tablet 2  . cetirizine (ZYRTEC) 10 MG tablet Take 10 mg by mouth daily.    Marland Kitchen escitalopram (LEXAPRO) 10 MG tablet TAKE 1 TABLET (10 MG TOTAL) BY MOUTH DAILY. 90 tablet 0  . pantoprazole (PROTONIX) 40 MG tablet Take 1 tablet (40 mg total) by mouth daily. 90 tablet 2   No current facility-administered medications on file prior to visit.     Social History  Substance Use Topics  . Smoking status: Never Smoker  . Smokeless tobacco: Never Used  . Alcohol use 0.0 oz/week     Comment: Occasionally     Review of Systems  Constitutional: Negative for chills and fever.  Respiratory: Negative for cough.   Cardiovascular: Negative for chest pain and palpitations.  Gastrointestinal: Negative for nausea and vomiting.  Genitourinary:  Negative for dysuria.  Musculoskeletal: Positive for back pain.      Objective:    BP 128/80   Pulse (!) 102   Temp 98.3 F (36.8 C) (Oral)   Ht 5' 3.5" (1.613 m)   Wt 215 lb 8 oz (97.8 kg)   SpO2 98%   BMI 37.58 kg/m  BP Readings from Last 3 Encounters:  10/24/16 128/80  09/12/16 118/76  02/04/16 110/68   Wt Readings from Last 3 Encounters:  10/24/16 215 lb 8 oz (97.8 kg)  09/12/16 211 lb 9.6 oz (96 kg)  02/04/16 200 lb 9.6 oz (91 kg)    Physical Exam  Constitutional: She appears well-developed and well-nourished.  Eyes: Conjunctivae are normal.  Cardiovascular: Normal rate, regular rhythm, normal heart sounds and normal pulses.   Pulmonary/Chest: Effort normal and breath sounds normal. She has no wheezes. She has no rhonchi. She has no rales.  Musculoskeletal:       Lumbar back: She exhibits normal range of motion, no tenderness, no bony tenderness, no swelling, no edema, no pain and no spasm.  Full range of motion with flexion, tension, lateral side bends. No bony tenderness. No pain, numbness, tingling elicited with single leg raise bilaterally.   Neurological: She is alert. She has normal strength. No sensory deficit.  Reflex Scores:      Patellar reflexes are 2+ on  the right side and 2+ on the left side. Sensation and strength intact bilateral lower extremities.  Skin: Skin is warm and dry.  Psychiatric: She has a normal mood and affect. Her speech is normal and behavior is normal. Thought content normal.  Vitals reviewed.      Assessment & Plan:   Problem List Items Addressed This Visit      Nervous and Auditory   Sciatica of right side - Primary   Relevant Medications   gabapentin (NEURONTIN) 100 MG capsule   Other Relevant Orders   Ambulatory referral to Physical Therapy   Ambulatory referral to Orthopedic Surgery       I have discontinued Ms. Mozer's cyclobenzaprine and predniSONE. I am also having her start on gabapentin. Additionally, I am having  her maintain her cetirizine, busPIRone, pantoprazole, and escitalopram.   Meds ordered this encounter  Medications  . gabapentin (NEURONTIN) 100 MG capsule    Sig: Take 3 capsules (300 mg total) by mouth at bedtime.    Dispense:  90 capsule    Refill:  1    Order Specific Question:   Supervising Provider    Answer:   Crecencio Mc [[id number]]    Return precautions given.   Risks, benefits, and alternatives of the medications and treatment plan prescribed today were discussed, and patient expressed understanding.   Education regarding symptom management and diagnosis given to patient on AVS.  Continue to follow with Mable Paris, FNP for routine health maintenance.   Abbott Pao Railey and I agreed with plan.   Mable Paris, FNP

## 2016-10-24 NOTE — Progress Notes (Signed)
Pre visit review using our clinic review tool, if applicable. No additional management support is needed unless otherwise documented below in the visit note. 

## 2016-10-24 NOTE — Assessment & Plan Note (Signed)
Unchanged. Patient and I decided referral to physical therapy and orthopedics for possible injection. Trial of gabapentin for neuropathic pain.

## 2016-11-15 ENCOUNTER — Ambulatory Visit: Payer: BLUE CROSS/BLUE SHIELD | Attending: Family

## 2016-11-15 DIAGNOSIS — G8929 Other chronic pain: Secondary | ICD-10-CM | POA: Diagnosis not present

## 2016-11-15 DIAGNOSIS — M6281 Muscle weakness (generalized): Secondary | ICD-10-CM | POA: Insufficient documentation

## 2016-11-15 DIAGNOSIS — M5441 Lumbago with sciatica, right side: Secondary | ICD-10-CM | POA: Insufficient documentation

## 2016-11-15 DIAGNOSIS — R262 Difficulty in walking, not elsewhere classified: Secondary | ICD-10-CM | POA: Diagnosis not present

## 2016-11-15 NOTE — Therapy (Signed)
[facility] [address] [place], [address] Phone: [phone]   Fax:  [phone]  Physical Therapy Evaluation  Patient Details  Name: Jaime Tate MRN: [id number] Date of Birth: November 25, 1976 Referring Provider: Burnard Hawthorne, FNP  Encounter Date: 11/15/2016      PT End of Session - 11/15/16 1643    Visit Number 1   Number of Visits 13   Date for PT Re-Evaluation 12/29/16   PT Start Time [id number]   PT Stop Time 1753   PT Time Calculation (min) 70 min   Activity Tolerance Patient tolerated treatment well   Behavior During Therapy [facility] for tasks assessed/performed      Past Medical History:  Diagnosis Date  . Allergy    Seasonal  . Anxiety   . Chicken pox   . GERD (gastroesophageal reflux disease)     No past surgical history on file.  There were no vitals filed for this visit.       Subjective Assessment - 11/15/16 1650    Subjective R PSIS/R LE pain: current 3/10 (pt sitting and also takes Gabapentin), 8/10 at worst after standing from prolonged sitting or when she rolls over in bed to change positions.   0/10 at best when using a heating pad.    Pertinent History Back pain with R sciatica. No known mechanism of injury. Just knows that it has been uncomfortable to sleep since mid June 2017. Tried changing mattresses which did not work. Went to [facility] in August and back in October for her symptoms because symptoms increased when standing, walking, standing to cook. Symptoms worsened.  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 Has not had imaging for her back or R LE yet. Denies bowel or bladder problems or saddle anesthesia.  Pain radiates from the R PSIS to posterior lateral R thigh and lateral leg and foot (digits 3-5; along L5/S1 dermatome).  Has had back pain before but has always resolved with exercises and gentle stretching.  Pt  states that her mother and grandmother has pirformis syndrome.   Patient Stated Goals I'd like to be able to go back to the gym, sleep through the night (4 hours average), chase her 40 year old again.    Currently in Pain? Yes   Pain Score 3    Pain Location --  R LE   Pain Orientation Right   Pain Descriptors / Indicators Aching;Tingling;Shooting;Stabbing   Pain Type Chronic pain   Pain Onset More than a month ago   Pain Frequency Intermittent   Aggravating Factors  standing activities such as cooking, walking, standing up after prolonged sitting, about an hour  (gets intense spasm along R sciatic nerve and has to stand for a while).  Lying down on R side.  Putting on her shoes, lifting her R leg up.    Pain Relieving Factors gabapentin (helped with sciatic pain), advil, sleeping with a pillow between legs, heating pad             [facility] PT Assessment - 11/15/16 1708      Assessment   Medical Diagnosis Sciatica of R side   Referring Provider Burnard Hawthorne, FNP   Onset Date/Surgical Date 05/05/16  June 2017 or earlier. No specific date provided   Prior Therapy No PT for R LE symptoms or prior back pain.      Precautions  Precaution Comments No known precautions     Restrictions   Other Position/Activity Restrictions No known restrictions     Balance Screen   Has the patient fallen in the past 6 months No   Has the patient had a decrease in activity level because of a fear of falling?  No   Is the patient reluctant to leave their home because of a fear of falling?  No     Home Environment   Additional Comments Patient lives in a 2 story home with husband and son, 1 step to enter, no rail, 17 steps inside with R rail      Prior Function   Vocation Full time employment  Banking   Vocation Requirements PLOF: better able to stand up after sitting for long periods (conference calls), cooking, walking, don and doff shoes     Observation/Other Assessments   Observations (+)  Slump R LE with reproduction of symptoms. Slump test for L LE reproduced R LE symptoms.  (+) SLR hip flexion R LE with repoduction of symptoms (around 40 degrees). L LE neural tension with L LE SLR hip flexion.  R posterior hip pain/discomfort/tightness with R pirformis test.  Symptoms increased with prone on elbows. R LE longer in both supine and long sit position.    Modified Oswertry 42%     Posture/Postural Control   Posture Comments protracted neck, slight R posterior pelvic rotation, slight L lateral shift     AROM   Lumbar Flexion limited with reproduction of R LE symptoms    Lumbar Extension limited with no symptoms. limited lumbar extension movement   Lumbar - Right Side [facility], no symptoms in side bend position. Symptoms when returning to upright position.    Lumbar - Left Side [facility] with R posterior hip symptoms   Lumbar - Right Rotation WFL, no symptoms   Lumbar - Left Rotation full, no symptoms.     Strength   Right Hip Flexion 4/5   Right Hip Extension 4-/5  pain after release of glute squeeze   Right Hip ABduction 4/5   Left Hip Flexion 4+/5   Left Hip Extension 4/5  symptoms with release of glute squeeze   Left Hip ABduction 4+/5   Right Knee Flexion 5/5   Right Knee Extension 4+/5  unable fully extend leg secondary to R posterior hip spasm   Left Knee Flexion 5/5   Left Knee Extension 5/5     Palpation   Palpation comment no TTP R piriformis or R sciatic area. TTP R PSIS area. Thoracic stiffness.      Ambulation/Gait   Gait Comments R pelvic drop during L LE stance phase > L pelvic drop during L LE stance phase. R posterior hip pain during R initial swing phase.  Decreased trunk rotation.       Objectives  There-ex  Directed patient with prone glute max/quad set 7x5 seconds each LE.  Increased symptoms which eased with rest.    Prone glute max squeeze 8x5 seconds. Decreased symptoms with glute activation at first. Symptoms increased with increased  repetition. Eases with rest.   Seated with lumbar towel roll x 2 min. Slight decreased R LE symptoms, slight increased R hip joint discomfort. Pt was recommended to try sitting with the lumbar towel roll outside of therapy but to find a comfortable thickness for her. Pt verbalized understanding.   Improved exercise technique, movement at target joints, use of target muscles after mod verbal, visual, tactile cues.  PT Education - 11/15/16 1947    Education provided Yes   Education Details ther-ex, lumbar towel roll, plan of care   Person(s) Educated Patient   Methods Explanation;Demonstration;Tactile cues;Verbal cues   Comprehension Returned demonstration;Verbalized understanding             PT Long Term Goals - 11/15/16 1916      PT LONG TERM GOAL #1   Title Patient will have a decrease in R LE pain to 4/10 or less at worst to promote ability to stand up after sitting for about an hour at work, and ability to sleep.    Baseline 8/10 R LE pain at worst   Time 6   Period Weeks   Status New     PT LONG TERM GOAL #2   Title Patient will report being able to sleep for at least 6 hours average to promote ability to rest.    Baseline able to sleep 4 hours on average   Time 6   Period Weeks   Status New     PT LONG TERM GOAL #3   Title Patient will improve her Modified Oswestry Low Back Pain Disability Questionnaire by at least 12% as a demonstration of improved function.    Baseline 42%   Time 6   Period Weeks   Status New     PT LONG TERM GOAL #4   Title Patient will improve bilateral hip strength by at least 1/2 MMT grade to promote ability to perform standing tasks.    Time 6   Period Weeks   Status New               Plan - 11/15/16 1908    Clinical Impression Statement Patient is a 40 year old female who came to physical therapy secondary to low back/R posterior hip and R LE pain. She also presents with reproduction  of symptoms with lumbar flexion and L lumbar side bending; neural tension R LE > L, thoracic stiffness, bilateral hip weakness, and difficulty performing functional tasks such as cooking, walking, and standing up after sitting for about an hour. Patient will benefit from skilled physical therapy services to address the aforementioned deficits.    Rehab Potential Fair   Clinical Impairments Affecting Rehab Potential chronicity of condition   PT Frequency 2x / week   PT Duration 6 weeks   PT Treatment/Interventions Manual techniques;Therapeutic activities;Therapeutic exercise;Dry needling;Patient/family education;Neuromuscular re-education;Ultrasound;Electrical Stimulation;Traction;Aquatic Therapy   PT Next Visit Plan hip strengthening, gentle extension   Consulted and Agree with Plan of Care Patient      Patient will benefit from skilled therapeutic intervention in order to improve the following deficits and impairments:  Pain, Increased muscle spasms, Difficulty walking, Decreased strength, Decreased range of motion (muscles spasms R posterior hip)  Visit Diagnosis: Chronic right-sided low back pain with right-sided sciatica - Plan: PT plan of care cert/re-cert  Difficulty in walking, not elsewhere classified - Plan: PT plan of care cert/re-cert  Muscle weakness (generalized) - Plan: PT plan of care cert/re-cert     Problem List Patient Active Problem List   Diagnosis Date Noted  . Sciatica of right side 09/12/2016  . Routine general medical examination at a health care facility 01/04/2016  . Generalized anxiety disorder 01/04/2016  . GERD (gastroesophageal reflux disease) 06/12/2015  . Environmental allergies 06/12/2015  . Encounter to establish care 06/11/2015    Joneen Boers PT, DPT  11/15/2016, 7:58 PM  [facility] [address], [place], [address] Phone: [phone]   Fax:  [phone]  Name:  Jaime Tate MRN: [id number] Date of Birth: Jan 31, 1976

## 2016-11-15 NOTE — Patient Instructions (Signed)
  Pt was recommended to try sitting with the lumbar towel roll outside of therapy but to find a comfortable thickness for her. Pt verbalized understanding.

## 2016-11-17 ENCOUNTER — Ambulatory Visit: Payer: BLUE CROSS/BLUE SHIELD

## 2016-11-17 DIAGNOSIS — M6281 Muscle weakness (generalized): Secondary | ICD-10-CM | POA: Diagnosis not present

## 2016-11-17 DIAGNOSIS — R262 Difficulty in walking, not elsewhere classified: Secondary | ICD-10-CM | POA: Diagnosis not present

## 2016-11-17 DIAGNOSIS — G8929 Other chronic pain: Secondary | ICD-10-CM | POA: Diagnosis not present

## 2016-11-17 DIAGNOSIS — M5441 Lumbago with sciatica, right side: Principal | ICD-10-CM

## 2016-11-17 NOTE — Patient Instructions (Addendum)
Adduction: Hip - Knees Together (Sitting)    Sit with towel roll, or pillows, or a ball, etc between knees, keeping knees shoulder width apart. Push knees together. Hold for 10__ seconds.  Repeat __10_ times. Do __3_ times a day.  Copyright  VHI. All rights reserved.    Also gave seated R hip extension isometrics as part of her HEP 10x3 with 10 second holds daily. Pt demonstrated and verbalized understanding. Handout provided.

## 2016-11-17 NOTE — Therapy (Signed)
[facility] [address] [place], [address] Phone: [phone]   Fax:  [phone]  Physical Therapy Treatment  Patient Details  Name: Jaime Tate MRN: [id number] Date of Birth: Dec 26, 1975 Referring Provider: Burnard Hawthorne, FNP  Encounter Date: 11/17/2016      PT End of Session - 11/17/16 1707    Visit Number 2   Number of Visits 13   Date for PT Re-Evaluation 12/29/16   PT Start Time [id number]   PT Stop Time [id number]   PT Time Calculation (min) 41 min   Activity Tolerance Patient tolerated treatment well   Behavior During Therapy [facility] for tasks assessed/performed      Past Medical History:  Diagnosis Date  . Allergy    Seasonal  . Anxiety   . Chicken pox   . GERD (gastroesophageal reflux disease)     No past surgical history on file.  There were no vitals filed for this visit.      Subjective Assessment - 11/17/16 1708    Subjective R LE is a little bit sore today. Ran a lot of errands. The towel roll to low back actually helped her during the day. Better able to stand after conference calls. Tries to get up more often than sitting for an hour.  5/10 R posterior hip and calf soreness currently.    Pertinent History Back pain with R sciatica. No known mechanism of injury. Just knows that it has been uncomfortable to sleep since mid June 2017. Tried changing mattresses which did not work. Went to [place] in August and back in October for her symptoms because symptoms increased when standing, walking, standing to cook. Symptoms worsened.  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 Has not had imaging for her back or R LE yet. Denies bowel or bladder problems or saddle anesthesia.  Pain radiates from the R PSIS to posterior lateral R thigh and lateral leg and foot (digits 3-5; along L5/S1 dermatome).  Has had back pain before but has always  resolved with exercises and gentle stretching.  Pt states that her mother and grandmother has pirformis syndrome.   Patient Stated Goals I'd like to be able to go back to the gym, sleep through the night (4 hours average), chase her 40 year old again.    Currently in Pain? Yes   Pain Score 5    Pain Onset More than a month ago                                 PT Education - 11/17/16 1712    Education provided Yes   Education Details ther-ex, HEP   Person(s) Educated Patient   Methods Explanation;Demonstration;Tactile cues;Verbal cues;Handout   Comprehension Verbalized understanding;Returned demonstration        Objectives  Standing posture: slight L lateral shift  There-ex  Directed patient with seated hip adduction squeeze (neutral isometric) 10x5 seconds  Then 10x10 seconds for 2 sets. Decreased R LE symptoms during squeezes. Symptom return with relaxation.   Seated R hip extension isometrics 10x5 seconds  Then 10x10 seconds for 2 sets  Standing hip machine: R hip extension 25 lbs 10x. Increased R LE spasm  Forward step up onto 1st regular step R LE with L UE assist 10x2. Pins and needles R LE  Standing bilateral shoulder  extension resisting red band with scapular retraction 10x5 seconds for 2 sets to promote trunk muscle use. Decreased R LE pins and needles.   Standing R shoulder adduction resisitng red band 10x5 seconds. Increased R LE pins and needles  Then for L shoulder adduction 10x5 seconds for 2 sets. Pins and needles after 2nd set.   Seated R hip IR 10x2   Improved exercise technique, movement at target joints, use of target muscles after mod verbal, visual, tactile cues.      Decreased R LE symptoms with exercises to decrease R piriformis muscle activation and promote use of trunk muscles. 2/10 R LE pain after session.         PT Long Term Goals - 11/15/16 1916      PT LONG TERM GOAL #1   Title Patient will have a decrease in  R LE pain to 4/10 or less at worst to promote ability to stand up after sitting for about an hour at work, and ability to sleep.    Baseline 8/10 R LE pain at worst   Time 6   Period Weeks   Status New     PT LONG TERM GOAL #2   Title Patient will report being able to sleep for at least 6 hours average to promote ability to rest.    Baseline able to sleep 4 hours on average   Time 6   Period Weeks   Status New     PT LONG TERM GOAL #3   Title Patient will improve her Modified Oswestry Low Back Pain Disability Questionnaire by at least 12% as a demonstration of improved function.    Baseline 42%   Time 6   Period Weeks   Status New     PT LONG TERM GOAL #4   Title Patient will improve bilateral hip strength by at least 1/2 MMT grade to promote ability to perform standing tasks.    Time 6   Period Weeks   Status New               Plan - 11/17/16 1713    Clinical Impression Statement Decreased R LE symptoms with exercises to decrease R piriformis muscle activation and promote use of trunk muscles. 2/10 R LE pain after session.    Rehab Potential Fair   Clinical Impairments Affecting Rehab Potential chronicity of condition   PT Frequency 2x / week   PT Duration 6 weeks   PT Treatment/Interventions Manual techniques;Therapeutic activities;Therapeutic exercise;Dry needling;Patient/family education;Neuromuscular re-education;Ultrasound;Electrical Stimulation;Traction;Aquatic Therapy   PT Next Visit Plan hip strengthening, gentle extension   Consulted and Agree with Plan of Care Patient      Patient will benefit from skilled therapeutic intervention in order to improve the following deficits and impairments:  Pain, Increased muscle spasms, Difficulty walking, Decreased strength, Decreased range of motion (muscles spasms R posterior hip)  Visit Diagnosis: Chronic right-sided low back pain with right-sided sciatica  Difficulty in walking, not elsewhere classified  Muscle  weakness (generalized)     Problem List Patient Active Problem List   Diagnosis Date Noted  . Sciatica of right side 09/12/2016  . Routine general medical examination at a health care facility 01/04/2016  . Generalized anxiety disorder 01/04/2016  . GERD (gastroesophageal reflux disease) 06/12/2015  . Environmental allergies 06/12/2015  . Encounter to establish care 06/11/2015     Joneen Boers PT, DPT   11/17/2016, 6:58 PM  [facility] [address], [place], [address] Phone: [phone]   Fax:  [phone]  Name: Jaime Tate MRN: [id number] Date of Birth: 15-Nov-1976

## 2016-11-20 ENCOUNTER — Other Ambulatory Visit: Payer: Self-pay | Admitting: Nurse Practitioner

## 2016-11-21 ENCOUNTER — Ambulatory Visit: Payer: BLUE CROSS/BLUE SHIELD

## 2016-11-21 DIAGNOSIS — G8929 Other chronic pain: Secondary | ICD-10-CM

## 2016-11-21 DIAGNOSIS — R262 Difficulty in walking, not elsewhere classified: Secondary | ICD-10-CM | POA: Diagnosis not present

## 2016-11-21 DIAGNOSIS — M6281 Muscle weakness (generalized): Secondary | ICD-10-CM | POA: Diagnosis not present

## 2016-11-21 DIAGNOSIS — M5441 Lumbago with sciatica, right side: Principal | ICD-10-CM

## 2016-11-21 NOTE — Therapy (Signed)
[place] [address], [place], [address] Phone: [phone]   Fax:  [phone]  Physical Therapy Treatment  Patient Details  Name: Jaime Tate MRN: [id number] Date of Birth: May 20, 1976 Referring Provider: Burnard Hawthorne, FNP  Encounter Date: 11/21/2016      PT End of Session - 11/21/16 1620    Visit Number 3   Number of Visits 13   Date for PT Re-Evaluation 12/29/16   PT Start Time 1620   PT Stop Time 1702   PT Time Calculation (min) 42 min   Activity Tolerance Patient tolerated treatment well   Behavior During Therapy [facility] for tasks assessed/performed      Past Medical History:  Diagnosis Date  . Allergy    Seasonal  . Anxiety   . Chicken pox   . GERD (gastroesophageal reflux disease)     No past surgical history on file.  There were no vitals filed for this visit.      Subjective Assessment - 11/21/16 1620    Subjective R LE is fine when sitting, a little sore when walking. Over did it this weekend. 2/10 R LE currently in sitting 3/10 when walking.    Pertinent History Back pain with R sciatica. No known mechanism of injury. Just knows that it has been uncomfortable to sleep since mid June 2017. Tried changing mattresses which did not work. Went to [place] in August and back in October for her symptoms because symptoms increased when standing, walking, standing to cook. Symptoms worsened.  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 Has not had imaging for her back or R LE yet. Denies bowel or bladder problems or saddle anesthesia.  Pain radiates from the R PSIS to posterior lateral R thigh and lateral leg and foot (digits 3-5; along L5/S1 dermatome).  Has had back pain before but has always resolved with exercises and gentle stretching.  Pt states that her mother and grandmother has pirformis syndrome.   Patient Stated Goals  I'd like to be able to go back to the gym, sleep through the night (4 hours average), chase her 40 year old again.    Currently in Pain? Yes   Pain Score 3   3/10 R LE (posterior hip) when walking   Pain Onset More than a month ago                                 PT Education - 11/21/16 1625    Education provided Yes   Education Details ther-ex   [employer]) Educated Patient   Methods Explanation;Demonstration;Tactile cues;Verbal cues   Comprehension Verbalized understanding;Returned demonstration        Objectives  There-ex  Directed patient with gait around gym with emphasis on glute max squeeze 300 ft. Less R posterior hip discomfort with gait.   Standing mini squats 10x  Standing hip machine: R hip extension 25 lbs 10x2. No R LE spasms today.  Side stepping 32 ft x 3 each direction   Prone glute max quad sets 10x5 seconds for 2 sets each LE. No increased symptoms today.   Supine bridge pain free range 10x3  Supine transversus abdominis contraction 10x5 seconds  Then with pelvic floor contraction 10x5 seconds  Then with hip fallouts 5x3 each LE. Difficulty with R hip fallout > L. R pelvic  rotation during R hip fallout.    seated hip adduction squeeze (neutral isometric) 10x5 seconds, then 10x10 seconds    Standing bilateral shoulder extension resisting red band with scapular retraction 10x5 seconds for 2 sets to promote trunk muscle use.    Improved exercise technique, movement at target joints, use of target muscles after mod verbal, visual, tactile cues.     Decreased R hip soreness with gait with activation of glute max muscles. No spasms or increased symptoms with prone glute max/quad set or standing R hip extension exercises today. Added lumbopelvic stability exercises to promote more efficient glute muscle use and to help decrease symptoms. No R LE pain after session in standing. 2/10 R posterior hip pain in sitting after session. Pain  with with walking disappeared after session.           PT Long Term Goals - 11/15/16 1916      PT LONG TERM GOAL #1   Title Patient will have a decrease in R LE pain to 4/10 or less at worst to promote ability to stand up after sitting for about an hour at work, and ability to sleep.    Baseline 8/10 R LE pain at worst   Time 6   Period Weeks   Status New     PT LONG TERM GOAL #2   Title Patient will report being able to sleep for at least 6 hours average to promote ability to rest.    Baseline able to sleep 4 hours on average   Time 6   Period Weeks   Status New     PT LONG TERM GOAL #3   Title Patient will improve her Modified Oswestry Low Back Pain Disability Questionnaire by at least 12% as a demonstration of improved function.    Baseline 42%   Time 6   Period Weeks   Status New     PT LONG TERM GOAL #4   Title Patient will improve bilateral hip strength by at least 1/2 MMT grade to promote ability to perform standing tasks.    Time 6   Period Weeks   Status New               Plan - 11/21/16 1627    Clinical Impression Statement Decreased R hip soreness with gait with activation of glute max muscles. No spasms or increased symptoms with prone glute max/quad set or standing R hip extension exercises today. Added lumbopelvic stability exercises to promote more efficient glute muscle use and to help decrease symptoms. No R LE pain after session in standing. 2/10 R posterior hip pain in sitting after session. Pain with with walking disappeared after session.    Rehab Potential Fair   Clinical Impairments Affecting Rehab Potential chronicity of condition   PT Frequency 2x / week   PT Duration 6 weeks   PT Treatment/Interventions Manual techniques;Therapeutic activities;Therapeutic exercise;Dry needling;Patient/family education;Neuromuscular re-education;Ultrasound;Electrical Stimulation;Traction;Aquatic Therapy   PT Next Visit Plan hip strengthening, gentle  extension   Consulted and Agree with Plan of Care Patient      Patient will benefit from skilled therapeutic intervention in order to improve the following deficits and impairments:  Pain, Increased muscle spasms, Difficulty walking, Decreased strength, Decreased range of motion (muscles spasms R posterior hip)  Visit Diagnosis: Chronic right-sided low back pain with right-sided sciatica  Difficulty in walking, not elsewhere classified  Muscle weakness (generalized)     Problem List Patient Active Problem List   Diagnosis Date Noted  .  Sciatica of right side 09/12/2016  . Routine general medical examination at a health care facility 01/04/2016  . Generalized anxiety disorder 01/04/2016  . GERD (gastroesophageal reflux disease) 06/12/2015  . Environmental allergies 06/12/2015  . Encounter to establish care 06/11/2015    Joneen Boers PT, DPT  11/21/2016, 6:29 PM  [facility] [address] [place], [address] Phone: [phone]   Fax:  [phone]  Name: Jaime Tate MRN: [id number] Date of Birth: July 05, 1976

## 2016-11-23 ENCOUNTER — Ambulatory Visit: Payer: BLUE CROSS/BLUE SHIELD

## 2016-11-23 DIAGNOSIS — M5441 Lumbago with sciatica, right side: Principal | ICD-10-CM

## 2016-11-23 DIAGNOSIS — G8929 Other chronic pain: Secondary | ICD-10-CM | POA: Diagnosis not present

## 2016-11-23 DIAGNOSIS — R262 Difficulty in walking, not elsewhere classified: Secondary | ICD-10-CM

## 2016-11-23 DIAGNOSIS — M6281 Muscle weakness (generalized): Secondary | ICD-10-CM | POA: Diagnosis not present

## 2016-11-23 NOTE — Patient Instructions (Addendum)
Piriformis Stretch    Lying on back, pull right knee toward opposite shoulder. Hold _15___ seconds. Repeat ___5_ times. Do __3__ sessions per day.  http://gt2.exer.us/258   Copyright  VHI. All rights reserved.    Reviewed and gave supine hip fallouts 10x3 with 5 seconds  for 3x / day 5 days a week as part of her HEP. Pt demonstrated and verbalized understanding.

## 2016-11-23 NOTE — Therapy (Signed)
[facility] [address], [place], [address] Phone: [phone]   Fax:  [phone]  Physical Therapy Treatment  Patient Details  Name: Jaime Tate MRN: [id number] Date of Birth: [birth year] Referring Provider: Burnard Hawthorne, FNP  Encounter Date: 11/23/2016      PT End of Session - 11/23/16 1621    Visit Number 4   Number of Visits 13   Date for PT Re-Evaluation 12/29/16   PT Start Time 1620   PT Stop Time 1705   PT Time Calculation (min) 45 min   Activity Tolerance Patient tolerated treatment well   Behavior During Therapy [facility] for tasks assessed/performed      Past Medical History:  Diagnosis Date  . Allergy    Seasonal  . Anxiety   . Chicken pox   . GERD (gastroesophageal reflux disease)     No past surgical history on file.  There were no vitals filed for this visit.      Subjective Assessment - 11/23/16 1622    Subjective R LE is about a 2/10 (sitting). 3/10 when walking. Was pretty sore (muscle) later on that night after last session.    Pertinent History Back pain with R sciatica. No known mechanism of injury. Just knows that it has been uncomfortable to sleep since mid June 2017. Tried changing mattresses which did not work. Went to [place] in August and back in October for her symptoms because symptoms increased when standing, walking, standing to cook. Symptoms worsened.  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 Has not had imaging for her back or R LE yet. Denies bowel or bladder problems or saddle anesthesia.  Pain radiates from the R PSIS to posterior lateral R thigh and lateral leg and foot (digits 3-5; along L5/S1 dermatome).  Has had back pain before but has always resolved with exercises and gentle stretching.  Pt states that her mother and grandmother has pirformis syndrome.   Patient Stated Goals I'd like to be  able to go back to the gym, sleep through the night (4 hours average), chase her 40 year old again.    Currently in Pain? Yes   Pain Score 3    Pain Onset More than a month ago                                 PT Education - 11/23/16 1625    Education provided Yes   Education Details ther-ex, HEP   Person(s) Educated Patient   Methods Explanation;Demonstration;Tactile cues;Verbal cues;Handout   Comprehension Verbalized understanding;Returned demonstration        Objectives  There-ex  Directed patient with supine R piriformis stretch 5x 15 seconds  Supine R hip extension isometrics with R LE straight with glute max squeeze 10 seconds x 5 for 2 sets Supine bridge pain free range 10x3 Prone glute max quad sets 10x5 seconds for 2 sets each LE  Sitting with gentle trunk extension: symptom reproduction Sitting with gentle trunk flexion: no symptoms  Supine transversus abdominis contraction with pelvic floor contraction 10x5 seconds for 2 sets              Then with hip fallouts 10x2 each LE. Difficulty with lumbopelvic control R > L.  Seated R hip IR with core muscle activation 10x2. R hip joint discomfort. Eases  with rest.  Seated bilateral scapular retraction resisting red band with transversus abdominis and pelvic floor contraction 10x3 with 5 second holds  Side stepping 32 ft x 2 each direction for glute med muscle use   Improved exercise technique, movement at target joints, use of target muscles after mod verbal, visual, tactile cues.      No R LE symptoms after session and with activation of core and glute max muscles. Pt demonstrates core weakness and difficulty with lumbopelvic control. Added supine hip fallouts with transversus abdominis and pelvic floor muscle activation to help address aforementioned weakness.             PT Long Term Goals - 11/15/16 1916      PT LONG TERM GOAL #1   Title Patient will have a decrease in R LE pain  to 4/10 or less at worst to promote ability to stand up after sitting for about an hour at work, and ability to sleep.    Baseline 8/10 R LE pain at worst   Time 6   Period Weeks   Status New     PT LONG TERM GOAL #2   Title Patient will report being able to sleep for at least 6 hours average to promote ability to rest.    Baseline able to sleep 4 hours on average   Time 6   Period Weeks   Status New     PT LONG TERM GOAL #3   Title Patient will improve her Modified Oswestry Low Back Pain Disability Questionnaire by at least 12% as a demonstration of improved function.    Baseline 42%   Time 6   Period Weeks   Status New     PT LONG TERM GOAL #4   Title Patient will improve bilateral hip strength by at least 1/2 MMT grade to promote ability to perform standing tasks.    Time 6   Period Weeks   Status New               Plan - 11/23/16 1630    Clinical Impression Statement No R LE symptoms after session and with activation of core and glute max muscles. Pt demonstrates core weakness and difficulty with lumbopelvic control. Added supine hip fallouts with transversus abdominis and pelvic floor muscle activation to help address aforementioned weakness.    Rehab Potential Fair   Clinical Impairments Affecting Rehab Potential chronicity of condition   PT Frequency 2x / week   PT Duration 6 weeks   PT Treatment/Interventions Manual techniques;Therapeutic activities;Therapeutic exercise;Dry needling;Patient/family education;Neuromuscular re-education;Ultrasound;Electrical Stimulation;Traction;Aquatic Therapy   PT Next Visit Plan hip strengthening, gentle extension   Consulted and Agree with Plan of Care Patient      Patient will benefit from skilled therapeutic intervention in order to improve the following deficits and impairments:  Pain, Increased muscle spasms, Difficulty walking, Decreased strength, Decreased range of motion (muscles spasms R posterior hip)  Visit  Diagnosis: Chronic right-sided low back pain with right-sided sciatica  Difficulty in walking, not elsewhere classified  Muscle weakness (generalized)     Problem List Patient Active Problem List   Diagnosis Date Noted  . Sciatica of right side 09/12/2016  . Routine general medical examination at a health care facility 01/04/2016  . Generalized anxiety disorder 01/04/2016  . GERD (gastroesophageal reflux disease) 06/12/2015  . Environmental allergies 06/12/2015  . Encounter to establish care 06/11/2015    Joneen Boers PT, DPT  11/23/2016, 5:19 PM  [facility] [address], [place], [address] Phone: [phone]   Fax:  [phone]  Name: Jaime Tate MRN: [id number] Date of Birth: 1976-01-23

## 2016-11-25 ENCOUNTER — Other Ambulatory Visit: Payer: Self-pay | Admitting: Family Medicine

## 2016-12-07 ENCOUNTER — Ambulatory Visit: Payer: BLUE CROSS/BLUE SHIELD | Attending: Family

## 2016-12-07 DIAGNOSIS — M6281 Muscle weakness (generalized): Secondary | ICD-10-CM | POA: Insufficient documentation

## 2016-12-07 DIAGNOSIS — M5441 Lumbago with sciatica, right side: Secondary | ICD-10-CM | POA: Insufficient documentation

## 2016-12-07 DIAGNOSIS — G8929 Other chronic pain: Secondary | ICD-10-CM | POA: Diagnosis not present

## 2016-12-07 DIAGNOSIS — R262 Difficulty in walking, not elsewhere classified: Secondary | ICD-10-CM | POA: Diagnosis not present

## 2016-12-07 NOTE — Therapy (Signed)
[facility] [facility] [address], [place], [address] Phone: [phone]   Fax:  [phone]  Physical Therapy Treatment  Patient Details  Name: Jaime Tate MRN: [id number] Date of Birth: 1976-03-19 Referring Provider: Burnard Hawthorne, FNP  Encounter Date: 12/07/2016      PT End of Session - 12/07/16 1607    Visit Number 5   Number of Visits 13   Date for PT Re-Evaluation 12/29/16   PT Start Time [id number]   PT Stop Time 1645   PT Time Calculation (min) 40 min   Activity Tolerance Patient tolerated treatment well   Behavior During Therapy [facility] for tasks assessed/performed      Past Medical History:  Diagnosis Date  . Allergy    Seasonal  . Anxiety   . Chicken pox   . GERD (gastroesophageal reflux disease)     History reviewed. No pertinent surgical history.  There were no vitals filed for this visit.      Subjective Assessment - 12/07/16 1606    Subjective Pt reports she is doing well on this date. She reports 1-2/10 R hip pain upon arrival. She is performing HEP and states she is seeing improvement in her symptoms.    Pertinent History Back pain with R sciatica. No known mechanism of injury. Just knows that it has been uncomfortable to sleep since mid June 2017. Tried changing mattresses which did not work. Went to [place] in August and back in October for her symptoms because symptoms increased when standing, walking, standing to cook. Symptoms worsened.  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 Has not had imaging for her back or R LE yet. Denies bowel or bladder problems or saddle anesthesia.  Pain radiates from the R PSIS to posterior lateral R thigh and lateral leg and foot (digits 3-5; along L5/S1 dermatome).  Has had back pain before but has always resolved with exercises and gentle stretching.  Pt states that her mother and grandmother has  pirformis syndrome.   Patient Stated Goals I'd like to be able to go back to the gym, sleep through the night (4 hours average), chase her 41 year old again.    Currently in Pain? Yes   Pain Score 2    Pain Location Hip   Pain Orientation Right   Pain Descriptors / Indicators Shooting   Pain Type Chronic pain   Pain Onset More than a month ago   Pain Frequency Intermittent        TREATMENT   Ther-ex Nustep L1 x 3 minutes without UE support during history; Directed patient with supine R piriformis stretch 30 seconds x 3; Supine R hip ER stretch 30 second hold x 3; Hooklying hip fallouts with good control to improve pelvic control 2 x 10 each LE.  Supine bridge pain free range 2 x 10; Hooklying adductor isometric squeeze 5 second hold x 10;  Manual Therapy R hip long axis distraction with belt grade III, 30s/bout x 3 bouts; R hip inferior mobilizations a 90 degrees flexion with belt grade III, 30s/bout x 3 bouts; R hip medial to lateral mobilizations at 45 flexion (hooklying position) with belt grade III, 30s/bout x 3 bouts; L sidelying R piriformis STM with weight ball and elbow; Sciatic nerve flossing in sitting position with cervical flexion/extension and knee flexion/extension with DF  No R LE symptoms after session and with activation  of core and glute max muscles.                        PT Education - 12/07/16 1607    Education provided Yes   Education Details exercise technique/form   Person(s) Educated Patient   Methods Explanation   Comprehension Verbalized understanding             PT Long Term Goals - 11/15/16 1916      PT LONG TERM GOAL #1   Title Patient will have a decrease in R LE pain to 4/10 or less at worst to promote ability to stand up after sitting for about an hour at work, and ability to sleep.    Baseline 8/10 R LE pain at worst   Time 6   Period Weeks   Status New     PT LONG TERM GOAL #2   Title Patient will report  being able to sleep for at least 6 hours average to promote ability to rest.    Baseline able to sleep 4 hours on average   Time 6   Period Weeks   Status New     PT LONG TERM GOAL #3   Title Patient will improve her Modified Oswestry Low Back Pain Disability Questionnaire by at least 12% as a demonstration of improved function.    Baseline 42%   Time 6   Period Weeks   Status New     PT LONG TERM GOAL #4   Title Patient will improve bilateral hip strength by at least 1/2 MMT grade to promote ability to perform standing tasks.    Time 6   Period Weeks   Status New               Plan - 12/07/16 1607    Clinical Impression Statement Pt reports no R hip or RLE pain following session on this date. She initially reports pain in low back with seated cervical flexion but following sciatic nerve flossing she is able to perform pain-free cervical flexion. Pt encouraged to continue HEP and follow-up as scheduled.    Rehab Potential Fair   Clinical Impairments Affecting Rehab Potential chronicity of condition   PT Frequency 2x / week   PT Duration 6 weeks   PT Treatment/Interventions Manual techniques;Therapeutic activities;Therapeutic exercise;Dry needling;Patient/family education;Neuromuscular re-education;Ultrasound;Electrical Stimulation;Traction;Aquatic Therapy   PT Next Visit Plan hip strengthening, gentle extension   PT Home Exercise Plan As prescribed   Consulted and Agree with Plan of Care Patient      Patient will benefit from skilled therapeutic intervention in order to improve the following deficits and impairments:  Pain, Increased muscle spasms, Difficulty walking, Decreased strength, Decreased range of motion (muscles spasms R posterior hip)  Visit Diagnosis: Chronic right-sided low back pain with right-sided sciatica  Difficulty in walking, not elsewhere classified  Muscle weakness (generalized)     Problem List Patient Active Problem List   Diagnosis Date  Noted  . Sciatica of right side 09/12/2016  . Routine general medical examination at a health care facility 01/04/2016  . Generalized anxiety disorder 01/04/2016  . GERD (gastroesophageal reflux disease) 06/12/2015  . Environmental allergies 06/12/2015  . Encounter to establish care 06/11/2015   Phillips Grout PT, DPT   Huprich,Jason 12/07/2016, 5:05 PM  [place] [facility] [address], [place], [address] Phone: [phone]   Fax:  [phone]  Name: Jaime Tate MRN:  [id number] Date of Birth: [birth year]

## 2016-12-12 ENCOUNTER — Ambulatory Visit: Payer: BLUE CROSS/BLUE SHIELD

## 2016-12-12 DIAGNOSIS — R262 Difficulty in walking, not elsewhere classified: Secondary | ICD-10-CM

## 2016-12-12 DIAGNOSIS — M6281 Muscle weakness (generalized): Secondary | ICD-10-CM

## 2016-12-12 DIAGNOSIS — G8929 Other chronic pain: Secondary | ICD-10-CM | POA: Diagnosis not present

## 2016-12-12 DIAGNOSIS — M5441 Lumbago with sciatica, right side: Secondary | ICD-10-CM | POA: Diagnosis not present

## 2016-12-12 NOTE — Therapy (Signed)
[place] [facility] [address] [place], [address] Phone: [phone]   Fax:  [phone]  Physical Therapy Treatment  Patient Details  Name: Jaime Tate MRN: [id number] Date of Birth: [birth year] Referring Provider: Burnard Hawthorne, FNP  Encounter Date: 12/12/2016      PT End of Session - 12/12/16 1701    Visit Number 6   Number of Visits 13   Date for PT Re-Evaluation 12/29/16   PT Start Time 1701   PT Stop Time 1751   PT Time Calculation (min) 50 min   Activity Tolerance Patient tolerated treatment well   Behavior During Therapy [facility] for tasks assessed/performed      Past Medical History:  Diagnosis Date  . Allergy    Seasonal  . Anxiety   . Chicken pox   . GERD (gastroesophageal reflux disease)     No past surgical history on file.  There were no vitals filed for this visit.      Subjective Assessment - 12/12/16 1702    Subjective R hip and leg is fine. The R low back where the dimple is, is hurting today (3-4/10 currently). I think I slept on it wrong. Does not know if the pillow between her knees is too wide.  No sciatic symptoms (the exercises "keeps it at [place]")   Pertinent History Back pain with R sciatica. No known mechanism of injury. Just knows that it has been uncomfortable to sleep since mid June 2017. Tried changing mattresses which did not work. Went to [place] in August and back in October for her symptoms because symptoms increased when standing, walking, standing to cook. Symptoms worsened.  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 Has not had imaging for her back or R LE yet. Denies bowel or bladder problems or saddle anesthesia.  Pain radiates from the R PSIS to posterior lateral R thigh and lateral leg and foot (digits 3-5; along L5/S1 dermatome).  Has had back pain before but has always resolved with exercises and gentle  stretching.  Pt states that her mother and grandmother has pirformis syndrome.   Patient Stated Goals I'd like to be able to go back to the gym, sleep through the night (4 hours average), chase her 41 year old again.    Currently in Pain? Yes   Pain Score 4   R low back   Pain Onset More than a month ago                                 PT Education - 12/12/16 1720    Education provided Yes   Education Details ther-ex   [employer]) Educated Patient   Methods Explanation;Demonstration;Tactile cues;Verbal cues   Comprehension Returned demonstration;Verbalized understanding        Objectives  Reproduction of symptoms with palpation to R L5/S1 joint.    There-ex  Directed patient with sit to stand with emphasis on R LE use. Cues for R femoral control. 10x2  Supine R hip extension isometrics with R LE straight with glute max squeeze 10 seconds x 5 for 2 sets  supine R piriformis stretch 5x 15 seconds. Decreased R low back symptoms  Supine transversus abdominis contraction with pelvic floor contraction with hip fallouts 10x2 each LE.  Cues to decrease R pelvic rotation during R hip fallout  Supine bridge pain free range 10x3 with yellow band resisting hip abduction/ER   Seated hip adduction ball squeeze 10x2 with 10 second holds. Decreased R low back symptoms.    Prone glute max quad sets 9x10 seconds  each LE.  Movement at the bilateral L5/S1 joint palpated  Increased R low back soreness. Eases with rest.  Prone shoulder flexion 5x2 alternating each side. Increased R LE symptoms. Eases with rest.  Seated trunk flexion x 30 seconds   Seated glute max squeeze 10x5 seconds for 2 sets. Decreased R low back pain. Soreness R posterior hip.   Seated bilateral scapular retraction resisting red band with transversus abdominis and pelvic floor contraction 10x2 with 5 second holds  Seated bilateral shoulder extension resisting red band with transversus  abdominis and pelvic floor contraction 10x with 5 second holds    Improved exercise technique, movement at target joints, use of target muscles after mod verbal, visual, tactile cues.      No R low back pain or R LE symptoms after session. R posterior hip muscle soreness felt by patient. Continue working on R glute max strengthening, lumbopelvic control, core strengthening.         PT Long Term Goals - 11/15/16 1916      PT LONG TERM GOAL #1   Title Patient will have a decrease in R LE pain to 4/10 or less at worst to promote ability to stand up after sitting for about an hour at work, and ability to sleep.    Baseline 8/10 R LE pain at worst   Time 6   Period Weeks   Status New     PT LONG TERM GOAL #2   Title Patient will report being able to sleep for at least 6 hours average to promote ability to rest.    Baseline able to sleep 4 hours on average   Time 6   Period Weeks   Status New     PT LONG TERM GOAL #3   Title Patient will improve her Modified Oswestry Low Back Pain Disability Questionnaire by at least 12% as a demonstration of improved function.    Baseline 42%   Time 6   Period Weeks   Status New     PT LONG TERM GOAL #4   Title Patient will improve bilateral hip strength by at least 1/2 MMT grade to promote ability to perform standing tasks.    Time 6   Period Weeks   Status New               Plan - 12/12/16 1700    Clinical Impression Statement No R low back pain or R LE symptoms after session. R posterior hip muscle soreness felt by patient. Continue working on R glute max strengthening, lumbopelvic control, core strengthening.    Rehab Potential Fair   Clinical Impairments Affecting Rehab Potential chronicity of condition   PT Frequency 2x / week   PT Duration 6 weeks   PT Treatment/Interventions Manual techniques;Therapeutic activities;Therapeutic exercise;Dry needling;Patient/family education;Neuromuscular re-education;Ultrasound;Electrical  Stimulation;Traction;Aquatic Therapy   PT Next Visit Plan hip strengthening, gentle extension   PT Home Exercise Plan As prescribed   Consulted and Agree with Plan of Care Patient      Patient will benefit from skilled therapeutic intervention in order to improve the following deficits and impairments:  Pain, Increased muscle spasms, Difficulty walking, Decreased strength, Decreased range of motion (muscles spasms R posterior hip)  Visit Diagnosis: Chronic right-sided low back pain  with right-sided sciatica  Difficulty in walking, not elsewhere classified  Muscle weakness (generalized)     Problem List Patient Active Problem List   Diagnosis Date Noted  . Sciatica of right side 09/12/2016  . Routine general medical examination at a health care facility 01/04/2016  . Generalized anxiety disorder 01/04/2016  . GERD (gastroesophageal reflux disease) 06/12/2015  . Environmental allergies 06/12/2015  . Encounter to establish care 06/11/2015    Joneen Boers PT, DPT   12/12/2016, 6:03 PM  [place] [facility] [address], [place], [address] Phone: [phone]   Fax:  [phone]  Name: Jaime Tate MRN: [id number] Date of Birth: May 08, 1976

## 2016-12-13 ENCOUNTER — Other Ambulatory Visit: Payer: Self-pay | Admitting: Family

## 2016-12-13 DIAGNOSIS — M5431 Sciatica, right side: Secondary | ICD-10-CM

## 2016-12-14 ENCOUNTER — Ambulatory Visit: Payer: BLUE CROSS/BLUE SHIELD

## 2016-12-14 DIAGNOSIS — M5441 Lumbago with sciatica, right side: Secondary | ICD-10-CM | POA: Diagnosis not present

## 2016-12-14 DIAGNOSIS — G8929 Other chronic pain: Secondary | ICD-10-CM | POA: Diagnosis not present

## 2016-12-14 DIAGNOSIS — M6281 Muscle weakness (generalized): Secondary | ICD-10-CM

## 2016-12-14 DIAGNOSIS — R262 Difficulty in walking, not elsewhere classified: Secondary | ICD-10-CM | POA: Diagnosis not present

## 2016-12-14 NOTE — Therapy (Signed)
[place] [facility] [address], [place], [address] Phone: [phone]   Fax:  [phone]  Physical Therapy Treatment  Patient Details  Name: Jaime Tate MRN: [id number] Date of Birth: November 23, 1976 Referring Provider: Burnard Hawthorne, FNP  Encounter Date: 12/14/2016      PT End of Session - 12/14/16 1703    Visit Number 7   Number of Visits 13   Date for PT Re-Evaluation 12/29/16   PT Start Time 1703   PT Stop Time 1747   PT Time Calculation (min) 44 min   Activity Tolerance Patient tolerated treatment well   Behavior During Therapy [facility] for tasks assessed/performed      Past Medical History:  Diagnosis Date  . Allergy    Seasonal  . Anxiety   . Chicken pox   . GERD (gastroesophageal reflux disease)     No past surgical history on file.  There were no vitals filed for this visit.      Subjective Assessment - 12/14/16 1704    Subjective R LE is doing ok. Not hurting right now. Leaning forward increases the pulling sensation. Had a couple of times when she stood up today and that area was bothering her (after prolonged sitting). Sitting for long periods still bothers her but better for the most part. It was a lot better for a couple of weeks until she slept with the special pillow between her knees.  7/10 at most for the past 7 days.    Pertinent History Back pain with R sciatica. No known mechanism of injury. Just knows that it has been uncomfortable to sleep since mid June 2017. Tried changing mattresses which did not work. Went to [place] in August and back in October for her symptoms because symptoms increased when standing, walking, standing to cook. Symptoms worsened.  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 Has not had imaging for her back or R LE yet. Denies bowel or bladder problems or saddle anesthesia.  Pain radiates from the R PSIS  to posterior lateral R thigh and lateral leg and foot (digits 3-5; along L5/S1 dermatome).  Has had back pain before but has always resolved with exercises and gentle stretching.  Pt states that her mother and grandmother has pirformis syndrome.   Patient Stated Goals I'd like to be able to go back to the gym, sleep through the night (4 hours average), chase her 41 year old again.    Currently in Pain? No/denies   Pain Score 0-No pain   Pain Onset More than a month ago                                 PT Education - 12/14/16 1724    Education provided Yes   Education Details ther-ex   [employer]) Educated Patient   Methods Explanation;Demonstration;Tactile cues;Verbal cues   Comprehension Verbalized understanding;Returned demonstration        Objectives     There-ex  Directed patient with seated R hip IR 10x3  sit to stand with emphasis on R LE use. Cues for R femoral control. 10x2  Standing hip machine: hip extension plate 55 for [id number], then 70 lbs for 10x2  Hip abduction plate 25 for [id number], then plate 10  for [id number]  S/L R hip adduction 10x3  Supine bridge pain free  range 10x3 with yellow band resisting hip abduction/ER  Sitting with proper posture with gentle manual perturbation from PT (pt holding PVC pipe) to fatigue 3x  Seated bilateral shoulder extension resisting red band with transversus abdominis and pelvic floor contraction 10x with 5 second holds for 2 sets  Forward step up onto bosu with one UE assist 10x2 each LE Lateral step up onto bosu with one UE assist 10x2 each LE   Cues for femoral control.    Improved exercise technique, movement at target joints, use of target muscles after min to mod verbal, visual, tactile cues.     Pt tolerated session very well without complain of increased pain. Continued working on increasing hip muscle strength, core strength, and femoral control.        PT Long Term Goals - 11/15/16 1916       PT LONG TERM GOAL #1   Title Patient will have a decrease in R LE pain to 4/10 or less at worst to promote ability to stand up after sitting for about an hour at work, and ability to sleep.    Baseline 8/10 R LE pain at worst   Time 6   Period Weeks   Status New     PT LONG TERM GOAL #2   Title Patient will report being able to sleep for at least 6 hours average to promote ability to rest.    Baseline able to sleep 4 hours on average   Time 6   Period Weeks   Status New     PT LONG TERM GOAL #3   Title Patient will improve her Modified Oswestry Low Back Pain Disability Questionnaire by at least 12% as a demonstration of improved function.    Baseline 42%   Time 6   Period Weeks   Status New     PT LONG TERM GOAL #4   Title Patient will improve bilateral hip strength by at least 1/2 MMT grade to promote ability to perform standing tasks.    Time 6   Period Weeks   Status New               Plan - 12/14/16 1726    Clinical Impression Statement Pt tolerated session very well without complain of increased pain. Continued working on increasing hip muscle strength, core strength, and femoral control.    Rehab Potential Fair   Clinical Impairments Affecting Rehab Potential chronicity of condition   PT Frequency 2x / week   PT Duration 6 weeks   PT Treatment/Interventions Manual techniques;Therapeutic activities;Therapeutic exercise;Dry needling;Patient/family education;Neuromuscular re-education;Ultrasound;Electrical Stimulation;Traction;Aquatic Therapy   PT Next Visit Plan hip strengthening, gentle extension   PT Home Exercise Plan As prescribed   Consulted and Agree with Plan of Care Patient      Patient will benefit from skilled therapeutic intervention in order to improve the following deficits and impairments:  Pain, Increased muscle spasms, Difficulty walking, Decreased strength, Decreased range of motion (muscles spasms R posterior hip)  Visit Diagnosis: Chronic  right-sided low back pain with right-sided sciatica  Difficulty in walking, not elsewhere classified  Muscle weakness (generalized)     Problem List Patient Active Problem List   Diagnosis Date Noted  . Sciatica of right side 09/12/2016  . Routine general medical examination at a health care facility 01/04/2016  . Generalized anxiety disorder 01/04/2016  . GERD (gastroesophageal reflux disease) 06/12/2015  . Environmental allergies 06/12/2015  . Encounter to establish care 06/11/2015    [facility]  PT, DPT   12/14/2016, 6:02 PM  [place] [facility] [address] [place], [address] Phone: [phone]   Fax:  [phone]  Name: Daralynn Devaul Mechling MRN: [id number] Date of Birth: Dec 20, 1975

## 2016-12-19 ENCOUNTER — Ambulatory Visit: Payer: BLUE CROSS/BLUE SHIELD

## 2016-12-19 DIAGNOSIS — R262 Difficulty in walking, not elsewhere classified: Secondary | ICD-10-CM | POA: Diagnosis not present

## 2016-12-19 DIAGNOSIS — M5441 Lumbago with sciatica, right side: Principal | ICD-10-CM

## 2016-12-19 DIAGNOSIS — G8929 Other chronic pain: Secondary | ICD-10-CM | POA: Diagnosis not present

## 2016-12-19 DIAGNOSIS — M6281 Muscle weakness (generalized): Secondary | ICD-10-CM

## 2016-12-19 NOTE — Patient Instructions (Signed)
(  Home) Hip: Internal Rotation - Sitting    Opposite side toward anchor, right foot in handle. Pull foot out. Vath hold chair to keep body still. Repeat ___10_ times per set. Do __3__ sets per session daily.   Copyright  V[employer]. All rights reserved.

## 2016-12-19 NOTE — Therapy (Signed)
[facility] [address] [place], [address] Phone: [phone]   Fax:  [phone]  Physical Therapy Treatment  Patient Details  Name: Jaime Tate MRN: [id number] Date of Birth: [birth year] Referring Provider: Burnard Hawthorne, FNP  Encounter Date: 12/19/2016      PT End of Session - 12/19/16 1705    Visit Number 8   Number of Visits 13   Date for PT Re-Evaluation 12/29/16   PT Start Time [id number]   PT Stop Time 1745   PT Time Calculation (min) 40 min   Activity Tolerance Patient tolerated treatment well   Behavior During Therapy [facility] for tasks assessed/performed      Past Medical History:  Diagnosis Date  . Allergy    Seasonal  . Anxiety   . Chicken pox   . GERD (gastroesophageal reflux disease)     No past surgical history on file.  There were no vitals filed for this visit.      Subjective Assessment - 12/19/16 1706    Subjective R LE is not too bad today. A little sore when sitting for too long. 0/10 current, 7/10 at worst (this weekend when pt sat on a hard metal bench for 20 min). Sitting for long periods of time bother her mainly. Now better able to move around, walk since starting PT.  Sleep is better. Able to sleep on average 9 hours a night (waking up to use the bathroom).   Pertinent History Back pain with R sciatica. No known mechanism of injury. Just knows that it has been uncomfortable to sleep since mid June 2017. Tried changing mattresses which did not work. Went to [place] in August and back in October for her symptoms because symptoms increased when standing, walking, standing to cook. Symptoms worsened.  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 Has not had imaging for her back or R LE yet. Denies bowel or bladder problems or saddle anesthesia.  Pain radiates from the R PSIS to posterior lateral R thigh and lateral leg and  foot (digits 3-5; along L5/S1 dermatome).  Has had back pain before but has always resolved with exercises and gentle stretching.  Pt states that her mother and grandmother has pirformis syndrome.   Patient Stated Goals I'd like to be able to go back to the gym, sleep through the night (4 hours average), chase her 41 year old again.    Currently in Pain? No/denies   Pain Score 0-No pain   Pain Onset More than a month ago       Objectives     There-ex  Directed patient with sitting with proper posture with gentle manual perturbation from PT (pt holding PVC pipe) to fatigue 3x   seated R hip IR 10x3  sit to stand with emphasis on R LE use. Cues for R femoral control. 10x2  Seated bilateral shoulder extension resting green band, sitting on physioball   10x, then 10x2 with 5 second holds   Standing hip machine: hip extension plate 70 lbs for D34-534             Hip abduction plate 10  for D34-534  standing ankle DF/PF on rocker board 2 min for tissue mobility  Seated press-ups 10x2 with 5 second holds  Seated anterior/posterior pelvic tilts 10x3 each way    Improved exercise technique, movement at target joints, use of target  muscles after mod verbal, visual, tactile cues.    Pt better able to sleep through the night without her symptoms waking her up per pt reports. Continued working on R hip strengthening, decreasing R piriformis muscle use, trunk strengthening. No complain of increased pain throughout session.                                 PT Education - 12/19/16 1716    Education provided Yes   Education Details ther-ex   [employer]) Educated Patient   Methods Explanation;Demonstration;Tactile cues;Verbal cues   Comprehension Returned demonstration;Verbalized understanding             PT Long Term Goals - 11/15/16 1916      PT LONG TERM GOAL #1   Title Patient will have a decrease in R LE pain to 4/10 or less at worst to promote  ability to stand up after sitting for about an hour at work, and ability to sleep.    Baseline 8/10 R LE pain at worst   Time 6   Period Weeks   Status New     PT LONG TERM GOAL #2   Title Patient will report being able to sleep for at least 6 hours average to promote ability to rest.    Baseline able to sleep 4 hours on average   Time 6   Period Weeks   Status New     PT LONG TERM GOAL #3   Title Patient will improve her Modified Oswestry Low Back Pain Disability Questionnaire by at least 12% as a demonstration of improved function.    Baseline 42%   Time 6   Period Weeks   Status New     PT LONG TERM GOAL #4   Title Patient will improve bilateral hip strength by at least 1/2 MMT grade to promote ability to perform standing tasks.    Time 6   Period Weeks   Status New               Plan - 12/19/16 1727    Clinical Impression Statement Pt better able to sleep through the night without her symptoms waking her up per pt reports. Continued working on R hip strengthening, decreasing R piriformis muscle use, trunk strengthening. No complain of increased pain throughout session.    Rehab Potential Fair   Clinical Impairments Affecting Rehab Potential chronicity of condition   PT Frequency 2x / week   PT Duration 6 weeks   PT Treatment/Interventions Manual techniques;Therapeutic activities;Therapeutic exercise;Dry needling;Patient/family education;Neuromuscular re-education;Ultrasound;Electrical Stimulation;Traction;Aquatic Therapy   PT Next Visit Plan hip strengthening, gentle extension   PT Home Exercise Plan As prescribed   Consulted and Agree with Plan of Care Patient      Patient will benefit from skilled therapeutic intervention in order to improve the following deficits and impairments:  Pain, Increased muscle spasms, Difficulty walking, Decreased strength, Decreased range of motion (muscles spasms R posterior hip)  Visit Diagnosis: Chronic right-sided low back pain  with right-sided sciatica  Difficulty in walking, not elsewhere classified  Muscle weakness (generalized)     Problem List Patient Active Problem List   Diagnosis Date Noted  . Sciatica of right side 09/12/2016  . Routine general medical examination at a health care facility 01/04/2016  . Generalized anxiety disorder 01/04/2016  . GERD (gastroesophageal reflux disease) 06/12/2015  . Environmental allergies 06/12/2015  . Encounter to establish care 06/11/2015  Joneen Boers PT, DPT  12/19/2016, 6:55 PM  [facility] [address] [place], [address] Phone: [phone]   Fax:  [phone]  Name: Jaime Tate MRN: [id number] Date of Birth: Feb 28, 1976

## 2016-12-20 ENCOUNTER — Other Ambulatory Visit: Payer: Self-pay | Admitting: Physical Medicine and Rehabilitation

## 2016-12-20 DIAGNOSIS — M5416 Radiculopathy, lumbar region: Secondary | ICD-10-CM | POA: Diagnosis not present

## 2016-12-20 DIAGNOSIS — M5136 Other intervertebral disc degeneration, lumbar region: Secondary | ICD-10-CM | POA: Diagnosis not present

## 2016-12-21 ENCOUNTER — Ambulatory Visit: Payer: BLUE CROSS/BLUE SHIELD

## 2016-12-26 ENCOUNTER — Ambulatory Visit: Payer: BLUE CROSS/BLUE SHIELD

## 2016-12-26 DIAGNOSIS — G8929 Other chronic pain: Secondary | ICD-10-CM | POA: Diagnosis not present

## 2016-12-26 DIAGNOSIS — R262 Difficulty in walking, not elsewhere classified: Secondary | ICD-10-CM

## 2016-12-26 DIAGNOSIS — M6281 Muscle weakness (generalized): Secondary | ICD-10-CM

## 2016-12-26 DIAGNOSIS — M5441 Lumbago with sciatica, right side: Secondary | ICD-10-CM | POA: Diagnosis not present

## 2016-12-26 NOTE — Therapy (Signed)
[facility] [address] [place], [address] Phone: [phone]   Fax:  [phone]  Physical Therapy Treatment  Patient Details  Name: Jaime Tate MRN: [id number] Date of Birth: 09/09/1976 Referring Provider: Burnard Hawthorne, FNP  Encounter Date: 12/26/2016      PT End of Session - 12/26/16 1704    Visit Number 9   Number of Visits 13   Date for PT Re-Evaluation 12/29/16   PT Start Time [id number]   PT Stop Time [id number]   PT Time Calculation (min) 43 min   Activity Tolerance Patient tolerated treatment well   Behavior During Therapy [facility] for tasks assessed/performed      Past Medical History:  Diagnosis Date  . Allergy    Seasonal  . Anxiety   . Chicken pox   . GERD (gastroesophageal reflux disease)     No past surgical history on file.  There were no vitals filed for this visit.      Subjective Assessment - 12/26/16 1706    Subjective Still having tightness and slight pain in the R dimple area (PSIS). Other than that she is sleeping fine. Had a consult with and orthopedic doctor, and getting an MRI 01/03/2017.  Sitting during conference calls is a little better. Only hurt for a second when she stood up. Used hurt for about 4 seconds prior to her spasm easing off.   5/10  R LE pain at most for the past 7 days.    Pertinent History Back pain with R sciatica. No known mechanism of injury. Just knows that it has been uncomfortable to sleep since mid June 2017. Tried changing mattresses which did not work. Went to [place] in August and back in October for her symptoms because symptoms increased when standing, walking, standing to cook. Symptoms worsened.  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 Has not had imaging for her back or R LE yet. Denies bowel or bladder problems or saddle anesthesia.  Pain radiates from the R PSIS to posterior lateral R  thigh and lateral leg and foot (digits 3-5; along L5/S1 dermatome).  Has had back pain before but has always resolved with exercises and gentle stretching.  Pt states that her mother and grandmother has pirformis syndrome.   Patient Stated Goals I'd like to be able to go back to the gym, sleep through the night (4 hours average), chase her 41 year old again.    Currently in Pain? Yes   Pain Score 2    Pain Onset More than a month ago            [facility] PT Assessment - 12/26/16 1736      Strength   Right Hip Flexion 4/5   Right Hip Extension 4-/5   Right Hip ABduction 4+/5   Left Hip Flexion 5/5   Left Hip Extension 4/5   Left Hip ABduction 4+/5                             PT Education - 12/26/16 1728    Education provided Yes   Education Details ther-ex, HEP   Person(s) Educated Patient   Methods Explanation;Demonstration;Tactile cues;Verbal cues;Handout   Comprehension Verbalized understanding;Returned demonstration        Objectives  There-ex  Supine R knee to chest 10 seconds Supine R hip extension  with gentle manual resistance at 90 degrees flexion 5 seconds  Muscle energy technique to promote posterior nutation of R innominate  No R posterior hip pain afterwards  Self muscle energy technique to promote posterior nutation of R innominate 3x5 seconds for 2 sets  Reviewed and given as part of her HEP. Stop when there is no pain. Pt demonstrated and verbalized understanding.   Supine hip fallouts 10x each LE  Supine bridge 10x3  Seated manual trunk perturbation using PVC rod (pt holding rod) to fatigue 3x  Seated manually resisted hip flexion, S/L hip abduction, prone glute max extension 1-2x each way for each LE.   Increased R hip soreness. Eased with self muscle energy technique.   Reviewed progress/current status with hip strength with pt.   Self muscle energy technique again 2x to decrease R posterior hip soreness    Improved exercise  technique, movement at target joints, use of target muscles after min to mod verbal, visual, tactile cues.     Decreased R hip symptoms with muscle energy technique to promote posterior nutation of R innominate. Pt demonstrates decreasing R LE pain level at worst and continues to be able to sleep through the night per pt reports. Pt making very good progress with pain and sleep goals. Slight improvement in hip strength since initial evaluation.            PT Long Term Goals - 11/15/16 1916      PT LONG TERM GOAL #1   Title Patient will have a decrease in R LE pain to 4/10 or less at worst to promote ability to stand up after sitting for about an hour at work, and ability to sleep.    Baseline 8/10 R LE pain at worst   Time 6   Period Weeks   Status New     PT LONG TERM GOAL #2   Title Patient will report being able to sleep for at least 6 hours average to promote ability to rest.    Baseline able to sleep 4 hours on average   Time 6   Period Weeks   Status New     PT LONG TERM GOAL #3   Title Patient will improve her Modified Oswestry Low Back Pain Disability Questionnaire by at least 12% as a demonstration of improved function.    Baseline 42%   Time 6   Period Weeks   Status New     PT LONG TERM GOAL #4   Title Patient will improve bilateral hip strength by at least 1/2 MMT grade to promote ability to perform standing tasks.    Time 6   Period Weeks   Status New               Plan - 12/26/16 1732    Clinical Impression Statement Decreased R hip symptoms with muscle energy technique to promote posterior nutation of R innominate. Pt demonstrates decreasing R LE pain level at worst and continues to be able to sleep through the night per pt reports. Pt making very good progress with pain and sleep goals. Slight improvement in hip strength since initial evaluation.    Rehab Potential Fair   Clinical Impairments Affecting Rehab Potential chronicity of condition   PT  Frequency 2x / week   PT Duration 6 weeks   PT Treatment/Interventions Manual techniques;Therapeutic activities;Therapeutic exercise;Dry needling;Patient/family education;Neuromuscular re-education;Ultrasound;Electrical Stimulation;Traction;Aquatic Therapy   PT Next Visit Plan hip strengthening, gentle extension   PT Home Exercise Plan As  prescribed   Consulted and Agree with Plan of Care Patient      Patient will benefit from skilled therapeutic intervention in order to improve the following deficits and impairments:  Pain, Increased muscle spasms, Difficulty walking, Decreased strength, Decreased range of motion (muscles spasms R posterior hip)  Visit Diagnosis: Chronic right-sided low back pain with right-sided sciatica  Difficulty in walking, not elsewhere classified  Muscle weakness (generalized)     Problem List Patient Active Problem List   Diagnosis Date Noted  . Sciatica of right side 09/12/2016  . Routine general medical examination at a health care facility 01/04/2016  . Generalized anxiety disorder 01/04/2016  . GERD (gastroesophageal reflux disease) 06/12/2015  . Environmental allergies 06/12/2015  . Encounter to establish care 06/11/2015    Joneen Boers PT, DPT   12/26/2016, 7:06 PM  [facility] [address], [place], [address] Phone: [phone]   Fax:  [phone]  Name: Camyla Robards Merkey MRN: [id number] Date of Birth: 1975-12-16

## 2016-12-26 NOTE — Patient Instructions (Signed)
    Pull right knee toward chest. 1. Gently push against your hands (your hands do not let your thigh move) for 5 seconds. Rest for 5 seconds. 2. Gently pull right knee to your chest again.   Repeat steps 1 and 2 for two more times.   Activate your abdominal muscles as your return your right leg to neutral.  Repeat _3___ times.     http://[email or web address].exer.us/225   Copyright  VHI. All rights reserved.

## 2016-12-28 ENCOUNTER — Ambulatory Visit: Payer: BLUE CROSS/BLUE SHIELD

## 2016-12-28 DIAGNOSIS — M6281 Muscle weakness (generalized): Secondary | ICD-10-CM | POA: Diagnosis not present

## 2016-12-28 DIAGNOSIS — M5441 Lumbago with sciatica, right side: Principal | ICD-10-CM

## 2016-12-28 DIAGNOSIS — G8929 Other chronic pain: Secondary | ICD-10-CM | POA: Diagnosis not present

## 2016-12-28 DIAGNOSIS — R262 Difficulty in walking, not elsewhere classified: Secondary | ICD-10-CM | POA: Diagnosis not present

## 2016-12-28 NOTE — Therapy (Signed)
[facility] [address], [place], [address] Phone: [phone]   Fax:  [phone]  Physical Therapy Treatment  Patient Details  Name: Jaime Tate MRN: [id number] Date of Birth: 04/10/1976 Referring Provider: Burnard Hawthorne, FNP  Encounter Date: 12/28/2016      PT End of Session - 12/28/16 1703    Visit Number 10   Number of Visits 21   Date for PT Re-Evaluation 01/26/17   PT Start Time 1703   PT Stop Time 1745   PT Time Calculation (min) 42 min   Activity Tolerance Patient tolerated treatment well   Behavior During Therapy [facility] for tasks assessed/performed      Past Medical History:  Diagnosis Date  . Allergy    Seasonal  . Anxiety   . Chicken pox   . GERD (gastroesophageal reflux disease)     No past surgical history on file.  There were no vitals filed for this visit.      Subjective Assessment - 12/28/16 1706    Subjective R side is sore like it was Monday. Not really seeing improvement in the general soreness in that area. 2/10 currently. Still sore after exercises last night (usually feels better after exercises). The soreness feels like it is wrapping around her R hip.  Has had that before. The symptoms down the leg are gone.  Had a conference call earlier for about 1 hour and was able to stand up without symptoms afterwards. Feels like she needs more therapy.    Pertinent History Back pain with R sciatica. No known mechanism of injury. Just knows that it has been uncomfortable to sleep since mid June 2017. Tried changing mattresses which did not work. Went to [place] in August and back in October for her symptoms because symptoms increased when standing, walking, standing to cook. Symptoms worsened.  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 Has not had imaging for her back or R LE yet. Denies bowel or bladder problems or  saddle anesthesia.  Pain radiates from the R PSIS to posterior lateral R thigh and lateral leg and foot (digits 3-5; along L5/S1 dermatome).  Has had back pain before but has always resolved with exercises and gentle stretching.  Pt states that her mother and grandmother has pirformis syndrome.   Patient Stated Goals I'd like to be able to go back to the gym, sleep through the night (4 hours average), chase her 41 year old again.    Currently in Pain? Yes   Pain Score 2    Pain Onset More than a month ago            [facility] PT Assessment - 12/28/16 1708      Observation/Other Assessments   Observations Clicking at the ball joint of R hip with supine R hip abduction, external rotation and extension. No symptoms with supine R hip flexion, adduction, and internal rotation.  (+) Faber test R hip with reproduction of symptoms.    Modified Oswertry 24%     Strength   Overall Strength Comments strength measured on 12/26/2016   Right Hip Flexion 4/5   Right Hip Extension 4-/5   Right Hip ABduction 4+/5   Left Hip Flexion 5/5   Left Hip Extension 4/5   Left Hip ABduction 4+/5  PT Education - 12/28/16 1719    Education provided Yes   Education Details ther-ex   [employer]) Educated Patient   Methods Explanation;Demonstration;Tactile cues;Verbal cues   Comprehension Returned demonstration;Verbalized understanding       Objectives  Per pt, R hip feels loose and pulling on things when lying on her L side without a pillow between her knees.   There-ex  SLR flexion 1/2 way 10x S/L R hip abduction 10x3 Supine hip adduction ball squeeze 10x5 seconds, then 5x5 seconds. Increased R anteior hip discomfort. Eases with rest  Muscle energy technique to promote posterior nutation or R innominate  Decreased symptoms  Mini sit-ups 10x2  R posterior hip symptoms during second set of 10  Supine hooklying position: manual perturbation to LE  (rotation) to fatigue 2x to promote trunk muscle strengthening  Supine bridge with red band resisting hip abduction/ER 10x2  Reviewed plan of care: continue 2x/week for 4 weeks  Forward step ups onto bosu ball with one UE assist 10x3   Improved exercise technique, movement at target joints, use of target muscles after mod verbal, visual, tactile cues.     Pt demonstrates decreased R LE pain level with worst pain level decreased to 5/10 compared to 8/10 originally. Symptoms also centralized towards posterior R hip/low back area. Pt currently feeling R hip joint pain as well and worked on muscle strengthening to promote stability and control to that area. Pt also better able to tolerate sitting for prolonged periods during her conferences at work and better able to stand up and walk with less symptoms afterwards. Pt now also able to have a full night's rest without her pain waking her up or keeping her awake. Pt still demonstrates R LE symptoms, R hip and pelvic pain, weakness, difficulty performing functional tasks and would benefit from continued skilled physical therapy services to address the aforementioned deficits.             PT Long Term Goals - 12/28/16 1801      PT LONG TERM GOAL #1   Title Patient will have a decrease in R LE pain to 4/10 or less at worst to promote ability to stand up after sitting for about an hour at work, and ability to sleep.    Baseline 8/10 R LE pain at worst; 5/10 at worst (12/26/2016)   Time 6   Period Weeks   Status On-going     PT LONG TERM GOAL #2   Title Patient will report being able to sleep for at least 6 hours average to promote ability to rest.    Baseline able to sleep 4 hours on average; able to sleep 9 hours (12/19/2016)   Time 6   Period Weeks   Status Achieved     PT LONG TERM GOAL #3   Title Patient will improve her Modified Oswestry Low Back Pain Disability Questionnaire by at least 12% as a demonstration of improved function.     Baseline 42%; 24% (12/28/2016)   Time 6   Period Weeks   Status Achieved     PT LONG TERM GOAL #4   Title Patient will improve bilateral hip strength by at least 1/2 MMT grade to promote ability to perform standing tasks.    Time 6   Period Weeks   Status Partially Met               Plan - 12/28/16 1705    Clinical Impression Statement Pt demonstrates decreased R LE pain  level with worst pain level decreased to 5/10 compared to 8/10 originally. Symptoms also centralized towards posterior R hip/low back area. Pt currently feeling R hip joint pain as well and worked on muscle strengthening to promote stability and control to that area. Pt also better able to tolerate sitting for prolonged periods during her conferences at work and better able to stand up and walk with less symptoms afterwards. Pt now also able to have a full night's rest without her pain waking her up or keeping her awake. Pt still demonstrates R LE symptoms, R hip and pelvic pain, weakness, difficulty performing functional tasks and would benefit from continued skilled physical therapy services to address the aforementioned deficits.     Rehab Potential Fair   Clinical Impairments Affecting Rehab Potential chronicity of condition   PT Frequency 2x / week   PT Duration 4 weeks   PT Treatment/Interventions Manual techniques;Therapeutic activities;Therapeutic exercise;Dry needling;Patient/family education;Neuromuscular re-education;Ultrasound;Electrical Stimulation;Traction;Aquatic Therapy   PT Next Visit Plan hip strengthening, core strengthening, stability and control   PT Home Exercise Plan As prescribed   Consulted and Agree with Plan of Care Patient      Patient will benefit from skilled therapeutic intervention in order to improve the following deficits and impairments:  Pain, Increased muscle spasms, Difficulty walking, Decreased strength, Decreased range of motion (muscles spasms R posterior hip)  Visit  Diagnosis: Chronic right-sided low back pain with right-sided sciatica - Plan: PT plan of care cert/re-cert  Difficulty in walking, not elsewhere classified - Plan: PT plan of care cert/re-cert  Muscle weakness (generalized) - Plan: PT plan of care cert/re-cert     Problem List Patient Active Problem List   Diagnosis Date Noted  . Sciatica of right side 09/12/2016  . Routine general medical examination at a health care facility 01/04/2016  . Generalized anxiety disorder 01/04/2016  . GERD (gastroesophageal reflux disease) 06/12/2015  . Environmental allergies 06/12/2015  . Encounter to establish care 06/11/2015   Joneen Boers PT, DPT   12/28/2016, 6:34 PM  [facility] [address] [place], [address] Phone: [phone]   Fax:  [phone]  Name: Jaime Tate MRN: [id number] Date of Birth: 1976/04/19

## 2017-01-02 ENCOUNTER — Ambulatory Visit: Payer: BLUE CROSS/BLUE SHIELD

## 2017-01-02 DIAGNOSIS — M5441 Lumbago with sciatica, right side: Secondary | ICD-10-CM | POA: Diagnosis not present

## 2017-01-02 DIAGNOSIS — R262 Difficulty in walking, not elsewhere classified: Secondary | ICD-10-CM | POA: Diagnosis not present

## 2017-01-02 DIAGNOSIS — M6281 Muscle weakness (generalized): Secondary | ICD-10-CM | POA: Diagnosis not present

## 2017-01-02 DIAGNOSIS — G8929 Other chronic pain: Secondary | ICD-10-CM | POA: Diagnosis not present

## 2017-01-02 NOTE — Therapy (Signed)
[facility] [address] [place], [address] Phone: [phone]   Fax:  [phone]  Physical Therapy Treatment  Patient Details  Name: Jaime Tate MRN: [id number] Date of Birth: [birth year] Referring Provider: Burnard Hawthorne, FNP  Encounter Date: 01/02/2017      PT End of Session - 01/02/17 1704    Visit Number 11   Number of Visits 21   Date for PT Re-Evaluation 01/26/17   PT Start Time 5361   PT Stop Time 1745   PT Time Calculation (min) 41 min   Activity Tolerance Patient tolerated treatment well   Behavior During Therapy [facility] for tasks assessed/performed      Past Medical History:  Diagnosis Date  . Allergy    Seasonal  . Anxiety   . Chicken pox   . GERD (gastroesophageal reflux disease)     No past surgical history on file.  There were no vitals filed for this visit.      Subjective Assessment - 01/02/17 1706    Subjective Ran out of gabapentin and does not need it anymore. The sciatic pain has gone away. Had a few twinges. Just feels the R hip pain. Stood up a few hours this weekend and sat down for about an hour in a car ride and was fine.  0/10 R sciatic pain, 2/10 R hip joint pain currently.    Pertinent History Back pain with R sciatica. No known mechanism of injury. Just knows that it has been uncomfortable to sleep since mid June 2017. Tried changing mattresses which did not work. Went to [place] in August and back in October for her symptoms because symptoms increased when standing, walking, standing to cook. Symptoms worsened.  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 Has not had imaging for her back or R LE yet. Denies bowel or bladder problems or saddle anesthesia.  Pain radiates from the R PSIS to posterior lateral R thigh and lateral leg and foot (digits 3-5; along L5/S1 dermatome).  Has had back pain before but has  always resolved with exercises and gentle stretching.  Pt states that her mother and grandmother has pirformis syndrome.   Patient Stated Goals I'd like to be able to go back to the gym, sleep through the night (4 hours average), chase her 41 year old again.    Currently in Pain? Yes   Pain Score 2   R hip joint pain   Pain Onset More than a month ago                                 PT Education - 01/02/17 1731    Education provided Yes   Education Details ther-ex   [employer]) Educated Patient   Methods Explanation;Demonstration;Tactile cues;Verbal cues   Comprehension Returned demonstration;Verbalized understanding       Objectives   Reproduction of R hip pain with quadrant position. Decreases with gentle distraction  Reproduction of R hip pain with supine R hip IR in 90/90 position    There-ex  R hip IR in hookying and R leg over L gentle manual stretch  No pain with R hip IR in 90/90 with slight improved ROM. Decreased symptoms with R hip quadrant.   Seated R hip IR 10x3 AROM  Supine gentle manual distraction R hip in hooklying 4x  3 seconds. Slight increased symptoms which eases with rest.   Supine hip adduction ball squeeze in hooklying 10x2 with 5 seconds.   Forward step ups onto bosu ball with one UE assist 10x3 Lateral step ups onto bosu ball with bilateral UE assist 10x3  Standing mini squats on upside down bosu with bilateral UE assist 10x3  Seated manual trunk perturbation using PVC rod (pt holding rod) to fatigue 2x   Improved exercise technique, movement at target joints, use of target muscles after min to mod verbal, visual, tactile cues.     Improved supine R hip IR in 90/90 with decreased symptoms following manual stretch. Continued working on R hip IR and strengthening, stability, and control (including trunk control and strengthening).  No R hip pain after session.         PT Long Term Goals - 12/28/16 1801      PT LONG  TERM GOAL #1   Title Patient will have a decrease in R LE pain to 4/10 or less at worst to promote ability to stand up after sitting for about an hour at work, and ability to sleep.    Baseline 8/10 R LE pain at worst; 5/10 at worst (12/26/2016)   Time 6   Period Weeks   Status On-going     PT LONG TERM GOAL #2   Title Patient will report being able to sleep for at least 6 hours average to promote ability to rest.    Baseline able to sleep 4 hours on average; able to sleep 9 hours (12/19/2016)   Time 6   Period Weeks   Status Achieved     PT LONG TERM GOAL #3   Title Patient will improve her Modified Oswestry Low Back Pain Disability Questionnaire by at least 12% as a demonstration of improved function.    Baseline 42%; 24% (12/28/2016)   Time 6   Period Weeks   Status Achieved     PT LONG TERM GOAL #4   Title Patient will improve bilateral hip strength by at least 1/2 MMT grade to promote ability to perform standing tasks.    Time 6   Period Weeks   Status Partially Met               Plan - 01/02/17 1731    Clinical Impression Statement Improved supine R hip IR in 90/90 with decreased symptoms following manual stretch. Continued working on R hip IR and strengthening, stability, and control (including trunk control and strengthening).  No R hip pain after session.    Rehab Potential Fair   Clinical Impairments Affecting Rehab Potential chronicity of condition   PT Frequency 2x / week   PT Duration 4 weeks   PT Treatment/Interventions Manual techniques;Therapeutic activities;Therapeutic exercise;Dry needling;Patient/family education;Neuromuscular re-education;Ultrasound;Electrical Stimulation;Traction;Aquatic Therapy   PT Next Visit Plan hip strengthening, core strengthening, stability and control   PT Home Exercise Plan As prescribed   Consulted and Agree with Plan of Care Patient      Patient will benefit from skilled therapeutic intervention in order to improve the  following deficits and impairments:  Pain, Increased muscle spasms, Difficulty walking, Decreased strength, Decreased range of motion (muscles spasms R posterior hip)  Visit Diagnosis: Chronic right-sided low back pain with right-sided sciatica  Difficulty in walking, not elsewhere classified  Muscle weakness (generalized)     Problem List Patient Active Problem List   Diagnosis Date Noted  . Sciatica of right side 09/12/2016  . Routine general medical  examination at a health care facility 01/04/2016  . Generalized anxiety disorder 01/04/2016  . GERD (gastroesophageal reflux disease) 06/12/2015  . Environmental allergies 06/12/2015  . Encounter to establish care 06/11/2015   Joneen Boers PT, DPT   01/02/2017, 5:55 PM  [facility] [address], [place], [address] Phone: [phone]   Fax:  [phone]  Name: Jaime Tate MRN: [id number] Date of Birth: 02/06/1976

## 2017-01-03 ENCOUNTER — Ambulatory Visit
Admission: RE | Admit: 2017-01-03 | Discharge: 2017-01-03 | Disposition: A | Discharge status: Home / Self Care | Payer: BLUE CROSS/BLUE SHIELD | Source: Ambulatory Visit | Attending: Physical Medicine and Rehabilitation | Admitting: Physical Medicine and Rehabilitation

## 2017-01-03 DIAGNOSIS — M5126 Other intervertebral disc displacement, lumbar region: Secondary | ICD-10-CM | POA: Insufficient documentation

## 2017-01-03 DIAGNOSIS — M545 Low back pain: Secondary | ICD-10-CM | POA: Diagnosis not present

## 2017-01-03 DIAGNOSIS — M5127 Other intervertebral disc displacement, lumbosacral region: Secondary | ICD-10-CM | POA: Insufficient documentation

## 2017-01-03 DIAGNOSIS — M5416 Radiculopathy, lumbar region: Secondary | ICD-10-CM | POA: Insufficient documentation

## 2017-01-04 DIAGNOSIS — M5416 Radiculopathy, lumbar region: Secondary | ICD-10-CM | POA: Diagnosis not present

## 2017-01-04 DIAGNOSIS — M5136 Other intervertebral disc degeneration, lumbar region: Secondary | ICD-10-CM | POA: Diagnosis not present

## 2017-01-05 ENCOUNTER — Ambulatory Visit (INDEPENDENT_AMBULATORY_CARE_PROVIDER_SITE_OTHER): Payer: BLUE CROSS/BLUE SHIELD | Admitting: Family

## 2017-01-05 ENCOUNTER — Encounter: Payer: Self-pay | Admitting: Family

## 2017-01-05 VITALS — BP 118/78 | HR 85 | Temp 98.2°F | Ht 64.0 in | Wt 212.0 lb

## 2017-01-05 DIAGNOSIS — Z0000 Encounter for general adult medical examination without abnormal findings: Secondary | ICD-10-CM

## 2017-01-05 LAB — CBC WITH DIFFERENTIAL/PLATELET
Basophils Absolute: 0.1 10*3/uL (ref 0.0–0.1)
Basophils Relative: 0.7 % (ref 0.0–3.0)
Eosinophils Absolute: 0.2 10*3/uL (ref 0.0–0.7)
Eosinophils Relative: 2 % (ref 0.0–5.0)
HCT: 40.5 % (ref 36.0–46.0)
Hemoglobin: 13.4 g/dL (ref 12.0–15.0)
Lymphocytes Relative: 31.6 % (ref 12.0–46.0)
Lymphs Abs: 3.5 10*3/uL (ref 0.7–4.0)
MCHC: 33.2 g/dL (ref 30.0–36.0)
MCV: 80.9 fl (ref 78.0–100.0)
Monocytes Absolute: 0.6 10*3/uL (ref 0.1–1.0)
Monocytes Relative: 5.6 % (ref 3.0–12.0)
Neutro Abs: 6.7 10*3/uL (ref 1.4–7.7)
Neutrophils Relative %: 60.1 % (ref 43.0–77.0)
Platelets: 360 10*3/uL (ref 150.0–400.0)
RBC: 5.01 Mil/uL (ref 3.87–5.11)
RDW: 13.4 % (ref 11.5–15.5)
WBC: 11.2 10*3/uL — ABNORMAL HIGH (ref 4.0–10.5)

## 2017-01-05 LAB — COMPREHENSIVE METABOLIC PANEL
ALT: 39 U/L — ABNORMAL HIGH (ref 0–35)
AST: 17 U/L (ref 0–37)
Albumin: 4.5 g/dL (ref 3.5–5.2)
Alkaline Phosphatase: 96 U/L (ref 39–117)
BUN: 15 mg/dL (ref 6–23)
CO2: 27 mEq/L (ref 19–32)
Calcium: 9.5 mg/dL (ref 8.4–10.5)
Chloride: 106 mEq/L (ref 96–112)
Creatinine, Ser: 0.86 mg/dL (ref 0.40–1.20)
GFR: 77.62 mL/min (ref >60.00)
Glucose, Bld: 107 mg/dL — ABNORMAL HIGH (ref 70–99)
Potassium: 4.2 mEq/L (ref 3.5–5.1)
Sodium: 140 mEq/L (ref 135–145)
Total Bilirubin: 0.3 mg/dL (ref 0.2–1.2)
Total Protein: 7.2 g/dL (ref 6.0–8.3)

## 2017-01-05 LAB — LIPID PANEL
Cholesterol: 182 mg/dL (ref 0–200)
HDL: 44.8 mg/dL (ref >39.00)
LDL Cholesterol: 115 mg/dL — ABNORMAL HIGH (ref 0–99)
NonHDL: 137.16
Total CHOL/HDL Ratio: 4
Triglycerides: 113 mg/dL (ref 0.0–149.0)
VLDL: 22.6 mg/dL (ref 0.0–40.0)

## 2017-01-05 LAB — TSH: TSH: 1.82 u[IU]/mL (ref 0.35–4.50)

## 2017-01-05 LAB — HEMOGLOBIN A1C: Hgb A1c MFr Bld: 5.9 % (ref 4.6–6.5)

## 2017-01-05 LAB — VITAMIN D 25 HYDROXY (VIT D DEFICIENCY, FRACTURES): VITD: 15.69 ng/mL — ABNORMAL LOW (ref 30.00–100.00)

## 2017-01-05 NOTE — Assessment & Plan Note (Addendum)
UTD pap smear; declines pelvic. No pelvic complaints. Due for mammogram, ordered. Advised 3D due to dense breasts. Screening labs today. Encouraged exercise program since back pain is improving.  No early family h/o colon cancer. No smoking history. Advised patient to follow up with [facility] for annual skin check.

## 2017-01-05 NOTE — Patient Instructions (Addendum)
We placed a referral. Mammogram this year. I asked that you call one the below locations and schedule this when it is convenient for you.   If you have dense breasts, you Clarkston ask for 3D mammogram over the traditional 2D mammogram as new evidence suggest 3D is superior. Please note that NOT all insurance companies cover 3D and you Traywick have to pay a higher copay. You Goynes call your insurance company to further clarify your benefits.   Options for Mammogram.    [facility]  [address]  [place], [place]  [phone]  * Offers 3D mammogram if you ask*   [facility] [address] [place], [place] [phone] * Note if you ask for 3D mammogram at this location, you must request [place],  location*   Health Maintenance, Female Introduction Adopting a healthy lifestyle and getting preventive care can go a long way to promote health and wellness. Talk with your health care provider about what schedule of regular examinations is right for you. This is a good chance for you to check in with your provider about disease prevention and staying healthy. In between checkups, there are plenty of things you can do on your own. Experts have done a lot of research about which lifestyle changes and preventive measures are most likely to keep you healthy. Ask your health care provider for more information. Weight and diet Eat a healthy diet  Be sure to include plenty of vegetables, fruits, low-fat dairy products, and lean protein.  Do not eat a lot of foods high in solid fats, added sugars, or salt.  Get regular exercise. This is one of the most important things you can do for your health.  Most adults should exercise for at least 150 minutes each week. The exercise should increase your heart rate and make you sweat (moderate-intensity exercise).  Most adults should also do strengthening exercises at least twice a week. This is in addition to the  moderate-intensity exercise. Maintain a healthy weight  Body mass index (BMI) is a measurement that can be used to identify possible weight problems. It estimates body fat based on height and weight. Your health care provider can help determine your BMI and help you achieve or maintain a healthy weight.  For females 20 years of age and older:  A BMI below 18.5 is considered underweight.  A BMI of 18.5 to 24.9 is normal.  A BMI of 25 to 29.9 is considered overweight.  A BMI of 30 and above is considered obese. Watch levels of cholesterol and blood lipids  You should start having your blood tested for lipids and cholesterol at 41 years of age, then have this test every 5 years.  You Dougher need to have your cholesterol levels checked more often if:  Your lipid or cholesterol levels are high.  You are older than 41 years of age.  You are at high risk for heart disease. Cancer screening Lung Cancer  Lung cancer screening is recommended for adults 55-80 years old who are at high risk for lung cancer because of a history of smoking.  A yearly low-dose CT scan of the lungs is recommended for people who:  Currently smoke.  Have quit within the past 15 years.  Have at least a 30-pack-year history of smoking. A pack year is smoking an average of one pack of cigarettes a day for 1 year.  Yearly screening should continue until it has been 15 years since you   quit.  Yearly screening should stop if you develop a health problem that would prevent you from having lung cancer treatment. Breast Cancer  Practice breast self-awareness. This means understanding how your breasts normally appear and feel.  It also means doing regular breast self-exams. Let your health care provider know about any changes, no matter how small.  If you are in your 20s or 30s, you should have a clinical breast exam (CBE) by a health care provider every 1-3 years as part of a regular health exam.  If you are 40 or  older, have a CBE every year. Also consider having a breast X-ray (mammogram) every year.  If you have a family history of breast cancer, talk to your health care provider about genetic screening.  If you are at high risk for breast cancer, talk to your health care provider about having an MRI and a mammogram every year.  Breast cancer gene (BRCA) assessment is recommended for women who have family members with BRCA-related cancers. BRCA-related cancers include:  Breast.  Ovarian.  Tubal.  Peritoneal cancers.  Results of the assessment will determine the need for genetic counseling and BRCA1 and BRCA2 testing. Cervical Cancer  Your health care provider Horney recommend that you be screened regularly for cancer of the pelvic organs (ovaries, uterus, and vagina). This screening involves a pelvic examination, including checking for microscopic changes to the surface of your cervix (Pap test). You Palma be encouraged to have this screening done every 3 years, beginning at age 21.  For women ages 30-65, health care providers Maslowski recommend pelvic exams and Pap testing every 3 years, or they Imran recommend the Pap and pelvic exam, combined with testing for human papilloma virus (HPV), every 5 years. Some types of HPV increase your risk of cervical cancer. Testing for HPV Eckhardt also be done on women of any age with unclear Pap test results.  Other health care providers Bialy not recommend any screening for nonpregnant women who are considered low risk for pelvic cancer and who do not have symptoms. Ask your health care provider if a screening pelvic exam is right for you.  If you have had past treatment for cervical cancer or a condition that could lead to cancer, you need Pap tests and screening for cancer for at least 20 years after your treatment. If Pap tests have been discontinued, your risk factors (such as having a new sexual partner) need to be reassessed to determine if screening should resume. Some  women have medical problems that increase the chance of getting cervical cancer. In these cases, your health care provider Junkin recommend more frequent screening and Pap tests. Colorectal Cancer  This type of cancer can be detected and often prevented.  Routine colorectal cancer screening usually begins at 41 years of age and continues through 41 years of age.  Your health care provider Scullion recommend screening at an earlier age if you have risk factors for colon cancer.  Your health care provider Dombkowski also recommend using home test kits to check for hidden blood in the stool.  A small camera at the end of a tube can be used to examine your colon directly (sigmoidoscopy or colonoscopy). This is done to check for the earliest forms of colorectal cancer.  Routine screening usually begins at age 50.  Direct examination of the colon should be repeated every 5-10 years through 41 years of age. However, you Sudberry need to be screened more often if early forms of precancerous   polyps or small growths are found. Skin Cancer  Check your skin from head to toe regularly.  Tell your health care provider about any new moles or changes in moles, especially if there is a change in a mole's shape or color.  Also tell your health care provider if you have a mole that is larger than the size of a pencil eraser.  Always use sunscreen. Apply sunscreen liberally and repeatedly throughout the day.  Protect yourself by wearing long sleeves, pants, a wide-brimmed hat, and sunglasses whenever you are outside. Heart disease, diabetes, and high blood pressure  High blood pressure causes heart disease and increases the risk of stroke. High blood pressure is more likely to develop in:  People who have blood pressure in the high end of the normal range (130-139/85-89 mm Hg).  People who are overweight or obese.  People who are African American.  If you are 18-39 years of age, have your blood pressure checked every  3-5 years. If you are 40 years of age or older, have your blood pressure checked every year. You should have your blood pressure measured twice-once when you are at a hospital or clinic, and once when you are not at a hospital or clinic. Record the average of the two measurements. To check your blood pressure when you are not at a hospital or clinic, you can use:  An automated blood pressure machine at a pharmacy.  A home blood pressure monitor.  If you are between 55 years and 79 years old, ask your health care provider if you should take aspirin to prevent strokes.  Have regular diabetes screenings. This involves taking a blood sample to check your fasting blood sugar level.  If you are at a normal weight and have a low risk for diabetes, have this test once every three years after 41 years of age.  If you are overweight and have a high risk for diabetes, consider being tested at a younger age or more often. Preventing infection Hepatitis B  If you have a higher risk for hepatitis B, you should be screened for this virus. You are considered at high risk for hepatitis B if:  You were born in a country where hepatitis B is common. Ask your health care provider which countries are considered high risk.  Your parents were born in a high-risk country, and you have not been immunized against hepatitis B (hepatitis B vaccine).  You have HIV or AIDS.  You use needles to inject street drugs.  You live with someone who has hepatitis B.  You have had sex with someone who has hepatitis B.  You get hemodialysis treatment.  You take certain medicines for conditions, including cancer, organ transplantation, and autoimmune conditions. Hepatitis C  Blood testing is recommended for:  Everyone born from 1945 through 1965.  Anyone with known risk factors for hepatitis C. Sexually transmitted infections (STIs)  You should be screened for sexually transmitted infections (STIs) including  gonorrhea and chlamydia if:  You are sexually active and are younger than 41 years of age.  You are older than 41 years of age and your health care provider tells you that you are at risk for this type of infection.  Your sexual activity has changed since you were last screened and you are at an increased risk for chlamydia or gonorrhea. Ask your health care provider if you are at risk.  If you do not have HIV, but are at risk, it Gustafson be   recommended that you take a prescription medicine daily to prevent HIV infection. This is called pre-exposure prophylaxis (PrEP). You are considered at risk if:  You are sexually active and do not regularly use condoms or know the HIV status of your partner(s).  You take drugs by injection.  You are sexually active with a partner who has HIV. Talk with your health care provider about whether you are at high risk of being infected with HIV. If you choose to begin PrEP, you should first be tested for HIV. You should then be tested every 3 months for as long as you are taking PrEP. Pregnancy  If you are premenopausal and you Deyton become pregnant, ask your health care provider about preconception counseling.  If you Ahonen become pregnant, take 400 to 800 micrograms (mcg) of folic acid every day.  If you want to prevent pregnancy, talk to your health care provider about birth control (contraception). Osteoporosis and menopause  Osteoporosis is a disease in which the bones lose minerals and strength with aging. This can result in serious bone fractures. Your risk for osteoporosis can be identified using a bone density scan.  If you are 65 years of age or older, or if you are at risk for osteoporosis and fractures, ask your health care provider if you should be screened.  Ask your health care provider whether you should take a calcium or vitamin D supplement to lower your risk for osteoporosis.  Menopause Madani have certain physical symptoms and risks.  Hormone  replacement therapy Riggins reduce some of these symptoms and risks. Talk to your health care provider about whether hormone replacement therapy is right for you. Follow these instructions at home:  Schedule regular health, dental, and eye exams.  Stay current with your immunizations.  Do not use any tobacco products including cigarettes, chewing tobacco, or electronic cigarettes.  If you are pregnant, do not drink alcohol.  If you are breastfeeding, limit how much and how often you drink alcohol.  Limit alcohol intake to no more than 1 drink per day for nonpregnant women. One drink equals 12 ounces of beer, 5 ounces of wine, or 1 ounces of hard liquor.  Do not use street drugs.  Do not share needles.  Ask your health care provider for help if you need support or information about quitting drugs.  Tell your health care provider if you often feel depressed.  Tell your health care provider if you have ever been abused or do not feel safe at home. This information is not intended to replace advice given to you by your health care provider. Make sure you discuss any questions you have with your health care provider. Document Released: 06/06/2011 Document Revised: 04/28/2016 Document Reviewed: 08/25/2015  2017 [employer]vier  

## 2017-01-05 NOTE — Progress Notes (Signed)
Subjective:    Patient ID: Jaime Tate, female    DOB: 1976/05/10, 41 y.o.   MRN: [id number]  CC: Jaime Tate is a 41 y.o. female who presents today for physical exam.    HPI: Doing well  Back pain much better with PT.       Colorectal Cancer Screening: No early family history Breast Cancer Screening: Mammogram due Cervical Cancer Screening: UTD 2016 , neg HPV and malignancy Bone Health screening/DEXA for 65+: No increased fracture risk. Defer screening at this time. Lung Cancer Screening: Doesn't have 30 year pack year history and age > 43 years. Immunizations       Tetanus - unknown  Labs: Screening labs today. Exercise: plans to start now that back pain better Alcohol use: occasional Smoking/tobacco use: Nonsmoker.  Regular dental exams: UTD Wears seat belt: Yes. Skin: Has seen derm couple of years, has seen [facility] a couple of years ago; negative biopsies.    HISTORY:  Past Medical History:  Diagnosis Date  . Allergy    Seasonal  . Anxiety   . Chicken pox   . GERD (gastroesophageal reflux disease)     History reviewed. No pertinent surgical history. Family History  Problem Relation Age of Onset  . Arthritis Mother   . Osteoporosis Mother   . Heart disease Father   . Hypertension Father   . Diabetes Father   . Cancer Maternal Grandmother 19    Breast  . Cancer Paternal Grandmother 51    Colon  . Lung cancer Paternal Grandfather   . Ovarian cancer Neg Hx       ALLERGIES: Biaxin [clarithromycin]  Current Outpatient Prescriptions on File Prior to Visit  Medication Sig Dispense Refill  . Acetaminophen (TYLENOL PO) Take by mouth.    . busPIRone (BUSPAR) 5 MG tablet Take 1 tablet (5 mg total) by mouth at bedtime as needed. 90 tablet 2  . cetirizine (ZYRTEC) 10 MG tablet Take 10 mg by mouth daily.    Marland Kitchen escitalopram (LEXAPRO) 10 MG tablet TAKE 1 TABLET BY MOUTH DAILY. 90 tablet 2  . gabapentin (NEURONTIN) 100 MG capsule Take 3 capsules  (300 mg total) by mouth at bedtime. 90 capsule 1  . Ibuprofen (ADVIL PO) Take by mouth.    . pantoprazole (PROTONIX) 40 MG tablet TAKE 1 TABLET (40 MG TOTAL) BY MOUTH DAILY. 90 tablet 3   No current facility-administered medications on file prior to visit.     Social History  Substance Use Topics  . Smoking status: Never Smoker  . Smokeless tobacco: Never Used  . Alcohol use 0.0 oz/week     Comment: 1 drink per week    Review of Systems  Constitutional: Negative for chills, fever and unexpected weight change.  HENT: Negative for congestion.   Respiratory: Negative for cough.   Cardiovascular: Negative for chest pain, palpitations and leg swelling.  Gastrointestinal: Negative for abdominal distention, nausea and vomiting.  Genitourinary: Negative for pelvic pain and vaginal discharge.  Musculoskeletal: Positive for back pain. Negative for arthralgias and myalgias.  Skin: Negative for rash.  Neurological: Negative for headaches.  Hematological: Negative for adenopathy.  Psychiatric/Behavioral: Negative for confusion.      Objective:    BP 118/78   Pulse 85   Temp 98.2 F (36.8 C) (Oral)   Ht 5\' 4"  (1.626 m)   Wt 212 lb (96.2 kg)   SpO2 97%   BMI 36.39 kg/m   BP Readings from Last 3 Encounters:  01/05/17 118/78  10/24/16 128/80  09/12/16 118/76   Wt Readings from Last 3 Encounters:  01/05/17 212 lb (96.2 kg)  10/24/16 215 lb 8 oz (97.8 kg)  09/12/16 211 lb 9.6 oz (96 kg)    Physical Exam  Constitutional: She appears well-developed and well-nourished.  Eyes: Conjunctivae are normal.  Neck: No thyroid mass and no thyromegaly present.  Cardiovascular: Normal rate, regular rhythm, normal heart sounds and normal pulses.   Pulmonary/Chest: Effort normal and breath sounds normal. She has no wheezes. She has no rhonchi. She has no rales. Right breast exhibits no inverted nipple, no mass, no nipple discharge, no skin change and no tenderness. Left breast exhibits no  inverted nipple, no mass, no nipple discharge, no skin change and no tenderness. Breasts are symmetrical.  CBE performed. Bilateral dense breasts. No masses appreciated  Lymphadenopathy:       Head (right side): No submental, no submandibular, no tonsillar, no preauricular, no posterior auricular and no occipital adenopathy present.       Head (left side): No submental, no submandibular, no tonsillar, no preauricular, no posterior auricular and no occipital adenopathy present.    She has no cervical adenopathy.       Right cervical: No superficial cervical, no deep cervical and no posterior cervical adenopathy present.      Left cervical: No superficial cervical, no deep cervical and no posterior cervical adenopathy present.    She has no axillary adenopathy.  Neurological: She is alert.  Skin: Skin is warm and dry.  Psychiatric: She has a normal mood and affect. Her speech is normal and behavior is normal. Thought content normal.  Vitals reviewed.      Assessment & Plan:   Problem List Items Addressed This Visit      Other   Routine general medical examination at a health care facility - Primary    UTD pap smear; declines pelvic. No pelvic complaints. Due for mammogram, ordered. Advised 3D due to dense breasts. Screening labs today. Encouraged exercise program since back pain is improving.  No early family h/o colon cancer. No smoking history. Advised patient to follow up with [facility] for annual skin check.       Relevant Orders   CBC with Differential/Platelet   Comprehensive metabolic panel   Hemoglobin A1c   Lipid panel   TSH   VITAMIN D 25 Hydroxy (Vit-D Deficiency, Fractures)   MM DIGITAL SCREENING BILATERAL       I am having Ms. Klawitter maintain her cetirizine, busPIRone, gabapentin, Ibuprofen (ADVIL PO), Acetaminophen (TYLENOL PO), pantoprazole, and escitalopram.   No orders of the defined types were placed in this encounter.   Return precautions given.   Risks,  benefits, and alternatives of the medications and treatment plan prescribed today were discussed, and patient expressed understanding.   Education regarding symptom management and diagnosis given to patient on AVS.   Continue to follow with Mable Paris, FNP for routine health maintenance.   Abbott Pao Lobos and I agreed with plan.   Mable Paris, FNP

## 2017-01-05 NOTE — Progress Notes (Signed)
Pre visit review using our clinic review tool, if applicable. No additional management support is needed unless otherwise documented below in the visit note. 

## 2017-01-10 ENCOUNTER — Ambulatory Visit: Payer: BLUE CROSS/BLUE SHIELD | Attending: Family

## 2017-01-10 DIAGNOSIS — M6281 Muscle weakness (generalized): Secondary | ICD-10-CM | POA: Diagnosis not present

## 2017-01-10 DIAGNOSIS — G8929 Other chronic pain: Secondary | ICD-10-CM | POA: Diagnosis not present

## 2017-01-10 DIAGNOSIS — R262 Difficulty in walking, not elsewhere classified: Secondary | ICD-10-CM

## 2017-01-10 DIAGNOSIS — M5441 Lumbago with sciatica, right side: Secondary | ICD-10-CM | POA: Insufficient documentation

## 2017-01-10 NOTE — Therapy (Signed)
[facility] [address] [place], [address] Phone: [phone]   Fax:  [phone]  Physical Therapy Treatment  Patient Details  Name: Jaime Tate MRN: [id number] Date of Birth: [birth year] Referring Provider: Burnard Hawthorne, FNP  Encounter Date: 01/10/2017      PT End of Session - 01/10/17 1756    Visit Number 12   Number of Visits 21   Date for PT Re-Evaluation 01/26/17   PT Start Time 7867   PT Stop Time 1840   PT Time Calculation (min) 44 min   Activity Tolerance Patient tolerated treatment well   Behavior During Therapy [facility] for tasks assessed/performed      Past Medical History:  Diagnosis Date  . Allergy    Seasonal  . Anxiety   . Chicken pox   . GERD (gastroesophageal reflux disease)     No past surgical history on file.  There were no vitals filed for this visit.      Subjective Assessment - 01/10/17 1758    Subjective Pt states that her MRI showed bulging discs at L4, and L5. R sciatic pain feels fine. Just have pain in R hip. 0/10 in sitting, 1-2/10 R hip pain when walking. R hip did not hurt when I left last session.  Had a lot of conference calls (6x) and did pretty good except for the last 2 but it was not that take your breath away pain. It was just uncomfortable.  2/10 R sciatic pain and 5/10 R low back pain at most for the past 7 days.    Pertinent History Back pain with R sciatica. No known mechanism of injury. Just knows that it has been uncomfortable to sleep since mid June 2017. Tried changing mattresses which did not work. Went to [facility] in August and back in October for her symptoms because symptoms increased when standing, walking, standing to cook. Symptoms worsened.  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 Has not had imaging for her back or R LE yet. Denies bowel or bladder problems or saddle anesthesia.   Pain radiates from the R PSIS to posterior lateral R thigh and lateral leg and foot (digits 3-5; along L5/S1 dermatome).  Has had back pain before but has always resolved with exercises and gentle stretching.  Pt states that her mother and grandmother has pirformis syndrome.   Patient Stated Goals I'd like to be able to go back to the gym, sleep through the night (4 hours average), chase her 41 year old again.    Currently in Pain? Yes   Pain Score 2   1-2/10   Pain Onset More than a month ago                                 PT Education - 01/10/17 1811    Education provided Yes   Education Details ther-ex   [employer]) Educated Patient   Methods Explanation;Demonstration;Tactile cues;Verbal cues   Comprehension Returned demonstration;Verbalized understanding        Objectives    There-ex  R hip IR in hookying and R leg over L gentle manual stretch 2x   No R hip pain in walking afterwards, just a little twinge in R low back.          Seated R hip IR 10x3 AROM  Supine hip adduction ball squeeze in hooklying 10x2 with 5 seconds  Seated gentle anterior pelvic tilts 10x2  Seated bilateral scapular retraction resisting red band 10x3 with 5 second holds  Seated bilateral shoulder extension resisting red band 10x2 with 5 second holds   Seated manual trunk perturbation using PVC rod (pt holding rod) x 3 min  Standing mini squats on upside down bosu with bilateral UE assist 10x2   Improved exercise technique, movement at target joints, use of target muscles after min to mod verbal, visual, tactile cues.      No back or R LE pain after session. Continue working on gentle lumbar extension, trunk strengthening, and R hip IR. Pt making very good progress towards pain goals.         PT Long Term Goals - 12/28/16 1801      PT LONG TERM GOAL #1   Title Patient will have a decrease in R LE pain to 4/10 or less at worst to promote ability to stand  up after sitting for about an hour at work, and ability to sleep.    Baseline 8/10 R LE pain at worst; 5/10 at worst (12/26/2016)   Time 6   Period Weeks   Status On-going     PT LONG TERM GOAL #2   Title Patient will report being able to sleep for at least 6 hours average to promote ability to rest.    Baseline able to sleep 4 hours on average; able to sleep 9 hours (12/19/2016)   Time 6   Period Weeks   Status Achieved     PT LONG TERM GOAL #3   Title Patient will improve her Modified Oswestry Low Back Pain Disability Questionnaire by at least 12% as a demonstration of improved function.    Baseline 42%; 24% (12/28/2016)   Time 6   Period Weeks   Status Achieved     PT LONG TERM GOAL #4   Title Patient will improve bilateral hip strength by at least 1/2 MMT grade to promote ability to perform standing tasks.    Time 6   Period Weeks   Status Partially Met               Plan - 01/10/17 1812    Clinical Impression Statement No back or R LE pain after session. Continue working on gentle lumbar extension, trunk strengthening, and R hip IR. Pt making very good progress towards pain goals.    Rehab Potential Fair   Clinical Impairments Affecting Rehab Potential chronicity of condition   PT Frequency 2x / week   PT Duration 4 weeks   PT Treatment/Interventions Manual techniques;Therapeutic activities;Therapeutic exercise;Dry needling;Patient/family education;Neuromuscular re-education;Ultrasound;Electrical Stimulation;Traction;Aquatic Therapy   PT Next Visit Plan hip strengthening, core strengthening, stability and control   PT Home Exercise Plan As prescribed   Consulted and Agree with Plan of Care Patient      Patient will benefit from skilled therapeutic intervention in order to improve the following deficits and impairments:  Pain, Increased muscle spasms, Difficulty walking, Decreased strength, Decreased range of motion (muscles spasms R posterior hip)  Visit  Diagnosis: Chronic right-sided low back pain with right-sided sciatica  Difficulty in walking, not elsewhere classified  Muscle weakness (generalized)     Problem List Patient Active Problem List   Diagnosis Date Noted  . Sciatica of right side 09/12/2016  . Routine general medical examination at a health care facility 01/04/2016  . Generalized anxiety disorder 01/04/2016  . GERD (  gastroesophageal reflux disease) 06/12/2015  . Environmental allergies 06/12/2015  . Encounter to establish care 06/11/2015    Joneen Boers PT, DPT   01/10/2017, 6:53 PM  [facility] [address], [place], [address] Phone: [phone]   Fax:  [phone]  Name: Jaime Tate MRN: [id number] Date of Birth: 13-Aug-1976

## 2017-01-12 ENCOUNTER — Ambulatory Visit: Payer: BLUE CROSS/BLUE SHIELD

## 2017-01-17 ENCOUNTER — Ambulatory Visit: Payer: BLUE CROSS/BLUE SHIELD

## 2017-01-17 DIAGNOSIS — M6281 Muscle weakness (generalized): Secondary | ICD-10-CM

## 2017-01-17 DIAGNOSIS — M5441 Lumbago with sciatica, right side: Principal | ICD-10-CM

## 2017-01-17 DIAGNOSIS — R262 Difficulty in walking, not elsewhere classified: Secondary | ICD-10-CM

## 2017-01-17 DIAGNOSIS — G8929 Other chronic pain: Secondary | ICD-10-CM | POA: Diagnosis not present

## 2017-01-17 NOTE — Therapy (Signed)
[facility] [address], [place], [address] Phone: [phone]   Fax:  [phone]  Physical Therapy Treatment  Patient Details  Name: Jaime Tate MRN: [id number] Date of Birth: 13-Jun-1976 Referring Provider: Burnard Hawthorne, FNP  Encounter Date: 01/17/2017      PT End of Session - 01/17/17 1746    Visit Number 13   Number of Visits 21   Date for PT Re-Evaluation 01/26/17   PT Start Time 1937   PT Stop Time 1831   PT Time Calculation (min) 45 min   Activity Tolerance Patient tolerated treatment well   Behavior During Therapy [facility] for tasks assessed/performed      Past Medical History:  Diagnosis Date  . Allergy    Seasonal  . Anxiety   . Chicken pox   . GERD (gastroesophageal reflux disease)     No past surgical history on file.  There were no vitals filed for this visit.      Subjective Assessment - 01/17/17 1747    Subjective Pt states back and R LE feels pretty good today. No pain currently. Very negligible pain when walking. 5/10 R low back/posterior hip area at most for the past 7 days. Did a lot of walking (several hours during the day). Felt better when she rested.  R LE has not been bothering her for the past 7 days.    Pertinent History Back pain with R sciatica. No known mechanism of injury. Just knows that it has been uncomfortable to sleep since mid June 2017. Tried changing mattresses which did not work. Went to [facility] in August and back in October for her symptoms because symptoms increased when standing, walking, standing to cook. Symptoms worsened.  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 Has not had imaging for her back or R LE yet. Denies bowel or bladder problems or saddle anesthesia.  Pain radiates from the R PSIS to posterior lateral R thigh and lateral leg and foot (digits 3-5; along L5/S1 dermatome).  Has had  back pain before but has always resolved with exercises and gentle stretching.  Pt states that her mother and grandmother has pirformis syndrome.   Patient Stated Goals I'd like to be able to go back to the gym, sleep through the night (4 hours average), chase her 41 year old again.    Currently in Pain? No/denies   Pain Score 0-No pain   Pain Onset More than a month ago                                 PT Education - 01/17/17 1751    Education provided Yes   Education Details ther-ex   [employer]) Educated Patient   Methods Explanation;Demonstration;Tactile cues;Verbal cues   Comprehension Verbalized understanding;Returned demonstration        Objectives    There-ex  Seated R hip IR 10x3 AROM Standing hip machine: R hip extension resisting plate 70 for 90W4  No R low back/posterior hip pain with walking afterwards  Can be performed at the gym  Standing mini lunge with R LE 10x3, cues for femoral control and use of posterior R hip muscles   Standing bilateral scapular retraction resisting red band 10x3 with 5 second holds  Seated gentle anterior pelvic tilts 10x2  Seated manual trunk perturbation using PVC  rod (pt holding rod) x 3 min   Standing bilateral shoulder extension at [facility] machine resisting plate 10 for 58I to promote trunk strengthening  Can be performed at the gym   Standing mini squats on upside down bosu with bilateral UE assist 10x2  Elliptical x 5 minutes at level 1. Cues for pacing herself. Performed to see if appropriate for her gym routine. No pain. Can perform at the gym.     Recommended for pt to avoid seated hamstring curls, knee extension machine, leg press machine, seated hip abduction machine, resisted trunk twisting and exercises that cause trunk flexion such as curl ups or crunches at the gym to not aggravate her symptoms.   Planks (forward) with knees bent 5x5 seconds for core strengthening.   Improved exercise  technique, movement at target joints, use of target muscles after min to mod verbal, visual, tactile cues.       Decreased R low back/posterior hip symptoms with gait after increasing use of R rear end muscle. Other than the increased walking during the weekend, pt reports minimal back/posterior hip symptoms. Pt making very good progress towards decreased pain. Continued working on trunk strengthening, scapular strengthening, gentle extension exercises.             PT Long Term Goals - 12/28/16 1801      PT LONG TERM GOAL #1   Title Patient will have a decrease in R LE pain to 4/10 or less at worst to promote ability to stand up after sitting for about an hour at work, and ability to sleep.    Baseline 8/10 R LE pain at worst; 5/10 at worst (12/26/2016)   Time 6   Period Weeks   Status On-going     PT LONG TERM GOAL #2   Title Patient will report being able to sleep for at least 6 hours average to promote ability to rest.    Baseline able to sleep 4 hours on average; able to sleep 9 hours (12/19/2016)   Time 6   Period Weeks   Status Achieved     PT LONG TERM GOAL #3   Title Patient will improve her Modified Oswestry Low Back Pain Disability Questionnaire by at least 12% as a demonstration of improved function.    Baseline 42%; 24% (12/28/2016)   Time 6   Period Weeks   Status Achieved     PT LONG TERM GOAL #4   Title Patient will improve bilateral hip strength by at least 1/2 MMT grade to promote ability to perform standing tasks.    Time 6   Period Weeks   Status Partially Met               Plan - 01/17/17 1745    Clinical Impression Statement Decreased R low back/posterior hip symptoms with gait after increasing use of R rear end muscle. Other than the increased walking during the weekend, pt reports minimal back/posterior hip symptoms. Pt making very good progress towards decreased pain. Continued working on trunk strengthening, scapular strengthening,  gentle extension exercises.    Rehab Potential Fair   Clinical Impairments Affecting Rehab Potential chronicity of condition   PT Frequency 2x / week   PT Duration 4 weeks   PT Treatment/Interventions Manual techniques;Therapeutic activities;Therapeutic exercise;Dry needling;Patient/family education;Neuromuscular re-education;Ultrasound;Electrical Stimulation;Traction;Aquatic Therapy   PT Next Visit Plan hip strengthening, core strengthening, stability and control   PT Home Exercise Plan As prescribed   Consulted and Agree with Plan of Care Patient  Patient will benefit from skilled therapeutic intervention in order to improve the following deficits and impairments:  Pain, Increased muscle spasms, Difficulty walking, Decreased strength, Decreased range of motion (muscles spasms R posterior hip)  Visit Diagnosis: Chronic right-sided low back pain with right-sided sciatica  Difficulty in walking, not elsewhere classified  Muscle weakness (generalized)     Problem List Patient Active Problem List   Diagnosis Date Noted  . Sciatica of right side 09/12/2016  . Routine general medical examination at a health care facility 01/04/2016  . Generalized anxiety disorder 01/04/2016  . GERD (gastroesophageal reflux disease) 06/12/2015  . Environmental allergies 06/12/2015  . Encounter to establish care 06/11/2015    Joneen Boers PT, DPT   01/17/2017, 6:37 PM  [facility] [address], [place], [address] Phone: [phone]   Fax:  [phone]  Name: Jaime Tate MRN: [id number] Date of Birth: 06/30/1976

## 2017-01-19 ENCOUNTER — Ambulatory Visit: Payer: BLUE CROSS/BLUE SHIELD

## 2017-01-19 DIAGNOSIS — G8929 Other chronic pain: Secondary | ICD-10-CM

## 2017-01-19 DIAGNOSIS — M5441 Lumbago with sciatica, right side: Secondary | ICD-10-CM | POA: Diagnosis not present

## 2017-01-19 DIAGNOSIS — R262 Difficulty in walking, not elsewhere classified: Secondary | ICD-10-CM

## 2017-01-19 DIAGNOSIS — M6281 Muscle weakness (generalized): Secondary | ICD-10-CM | POA: Diagnosis not present

## 2017-01-19 NOTE — Therapy (Signed)
[place] [facility] [address] [place], [address] Phone: [phone]   Fax:  [phone]  Physical Therapy Treatment And Discharge Summary   Patient Details  Name: Jaime Tate MRN: [id number] Date of Birth: February 03, 1976 Referring Provider: Burnard Hawthorne, FNP  Encounter Date: 01/19/2017      PT End of Session - 01/19/17 1747    Visit Number 14   Number of Visits 21   Date for PT Re-Evaluation 01/26/17   PT Start Time 5631   PT Stop Time 1816   PT Time Calculation (min) 29 min   Activity Tolerance Patient tolerated treatment well   Behavior During Therapy [facility] for tasks assessed/performed      Past Medical History:  Diagnosis Date  . Allergy    Seasonal  . Anxiety   . Chicken pox   . GERD (gastroesophageal reflux disease)     No past surgical history on file.  There were no vitals filed for this visit.      Subjective Assessment - 01/19/17 1748    Subjective No pain in the leg. Just the same negligible twinge in the hip area. Pt states today feels like a good day for graduation. The HEP helps. Able to sleep.  Other than walking a lot during the weekend (5/10), her back and R LE did not bother her.    Pertinent History Back pain with R sciatica. No known mechanism of injury. Just knows that it has been uncomfortable to sleep since mid June 2017. Tried changing mattresses which did not work. Went to MD in August and back in October for her symptoms because symptoms increased when standing, walking, standing to cook. Symptoms worsened.  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 Has not had imaging for her back or R LE yet. Denies bowel or bladder problems or saddle anesthesia.  Pain radiates from the R PSIS to posterior lateral R thigh and lateral leg and foot (digits 3-5; along L5/S1 dermatome).  Has had back pain before but has always  resolved with exercises and gentle stretching.  Pt states that her mother and grandmother has pirformis syndrome.   Patient Stated Goals I'd like to be able to go back to the gym, sleep through the night (4 hours average), chase her 41 year old again.    Currently in Pain? Yes   Pain Score --  "Negligible twinge"   Pain Onset More than a month ago            [facility] PT Assessment - 01/19/17 1758      Strength   Right Hip Flexion 5/5   Right Hip Extension 4/5   Right Hip ABduction 5/5   Left Hip Flexion 5/5   Left Hip Extension 4+/5   Left Hip ABduction 5/5                             PT Education - 01/19/17 1822    Education provided Yes   Education Details ther-ex   [employer]) Educated Patient   Methods Explanation;Demonstration;Tactile cues;Verbal cues   Comprehension Returned demonstration;Verbalized understanding        Objectives    There-ex  Reviewed previous HEP with pt.   Reviewed progress/current status towards goals.   Seated manually resisted hip flexion, S/L hip abduction, prone glute extension 1x each way for each  LE.   Standing hip machine: R hip extension resisting plate 70 for 29N9  Standing bilateral shoulder extension at [facility] machine resisting plate 10 for 89Q, plate 15 for 11H4 to promote trunk strengthening  Seated manual trunk perturbation using PVC rod (pt holding rod) x 3 min  Reviewed plan of care: discharge but continue with exercises at home. Return to PT with MD referral if needed.    Improved exercise technique, movement at target joints, use of target muscles after mod verbal, visual, tactile cues.     No back or R LE pain after session. Pt has demonstrated significant decrease in back and R LE pain levels, improved bilateral hip strength, function, and ability to sleep at night since initial evaluation. Patient has met all goals and is independent and consistent with her home exercise program. Skilled  physical therapy services discharged with patient continuing progress with her exercises at home.         PT Long Term Goals - 01/19/17 1754      PT LONG TERM GOAL #1   Title Patient will have a decrease in R LE pain to 4/10 or less at worst to promote ability to stand up after sitting for about an hour at work, and ability to sleep.    Baseline 8/10 R LE pain at worst; 5/10 at worst (12/26/2016); 5/10 at worst from a lot of walking but other than that, back and R LE fine. Pt states she thinks she met the 4/10 or less goal overall.  (01/19/2017)    Time 6   Period Weeks   Status Achieved     PT LONG TERM GOAL #2   Title Patient will report being able to sleep for at least 6 hours average to promote ability to rest.    Baseline able to sleep 4 hours on average; able to sleep 9 hours (12/19/2016)   Time 6   Period Weeks   Status Achieved     PT LONG TERM GOAL #3   Title Patient will improve her Modified Oswestry Low Back Pain Disability Questionnaire by at least 12% as a demonstration of improved function.    Baseline 42%; 24% (12/28/2016)   Time 6   Period Weeks   Status Achieved     PT LONG TERM GOAL #4   Title Patient will improve bilateral hip strength by at least 1/2 MMT grade to promote ability to perform standing tasks.    Time 6   Period Weeks   Status Achieved               Plan - 01/19/17 1822    Clinical Impression Statement No back or R LE pain after session. Pt has demonstrated significant decrease in back and R LE pain levels, improved bilateral hip strength, function, and ability to sleep at night since initial evaluation. Patient has met all goals and is independent and consistent with her home exercise program. Skilled physical therapy services discharged with patient continuing progress with her exercises at home.    Rehab Potential Fair   Clinical Impairments Affecting Rehab Potential chronicity of condition   PT Treatment/Interventions Manual  techniques;Therapeutic activities;Therapeutic exercise;Patient/family education;Neuromuscular re-education   PT Next Visit Plan Continue progress with HEP.    PT Home Exercise Plan As prescribed   Consulted and Agree with Plan of Care Patient      Patient will benefit from skilled therapeutic intervention in order to improve the following deficits and impairments:  Pain, Increased muscle spasms,  Difficulty walking, Decreased strength, Decreased range of motion (muscles spasms R posterior hip)  Visit Diagnosis: Chronic right-sided low back pain with right-sided sciatica  Difficulty in walking, not elsewhere classified  Muscle weakness (generalized)     Problem List Patient Active Problem List   Diagnosis Date Noted  . Sciatica of right side 09/12/2016  . Routine general medical examination at a health care facility 01/04/2016  . Generalized anxiety disorder 01/04/2016  . GERD (gastroesophageal reflux disease) 06/12/2015  . Environmental allergies 06/12/2015  . Encounter to establish care 06/11/2015    Thank you for your referral.   Joneen Boers PT, DPT   01/19/2017, 6:30 PM  [place] [facility] [address] [place], [address] Phone: [phone]   Fax:  [phone]  Name: Jaime Tate MRN: [id number] Date of Birth: 1976-08-15

## 2017-01-24 ENCOUNTER — Encounter: Payer: Self-pay | Admitting: Family

## 2017-03-01 ENCOUNTER — Ambulatory Visit
Admission: RE | Admit: 2017-03-01 | Discharge: 2017-03-01 | Disposition: A | Discharge status: Home / Self Care | Payer: BLUE CROSS/BLUE SHIELD | Source: Ambulatory Visit | Attending: Family | Admitting: Family

## 2017-03-01 DIAGNOSIS — Z0000 Encounter for general adult medical examination without abnormal findings: Secondary | ICD-10-CM | POA: Diagnosis not present

## 2017-03-01 DIAGNOSIS — Z1231 Encounter for screening mammogram for malignant neoplasm of breast: Secondary | ICD-10-CM | POA: Insufficient documentation

## 2017-08-16 ENCOUNTER — Other Ambulatory Visit: Payer: Self-pay | Admitting: Family

## 2017-11-11 ENCOUNTER — Other Ambulatory Visit: Payer: Self-pay | Admitting: Family

## 2018-01-12 ENCOUNTER — Telehealth: Payer: Self-pay | Admitting: Family

## 2018-01-12 NOTE — Telephone Encounter (Signed)
Copied from [place] [phone]. Topic: Quick Communication - See Telephone Encounter >> Jan 12, 2018  9:45 AM [name], NT wrote: CRM for notification. See Telephone encounter for:   01/12/18. Pt. Calling needing to reschedule physical due to schedule conflict and paperwork that needs to be done has be turned in by the 28th of February.  Pt. Calling to see if she can be worked in some how. The only time slots that seem to be open for a physical was February 25th at 10:00am or 4:00pm but that would put two CPE back to back. Please call pt. If  Jaime Tate will approve. Pt. Can be reached at [phone]

## 2018-01-15 ENCOUNTER — Encounter: Payer: BLUE CROSS/BLUE SHIELD | Admitting: Family

## 2018-01-15 NOTE — Telephone Encounter (Signed)
Please confer with margaret and see when we can work patient in .

## 2018-01-15 NOTE — Telephone Encounter (Signed)
What about in a hospital slot they already took the 10:00 appointment slot? It would be after a former [facility] patient .

## 2018-01-15 NOTE — Telephone Encounter (Signed)
2/25 at 10 is fine

## 2018-01-16 NOTE — Telephone Encounter (Signed)
That is fine 

## 2018-01-31 ENCOUNTER — Ambulatory Visit: Payer: BLUE CROSS/BLUE SHIELD | Admitting: Family

## 2018-01-31 NOTE — Telephone Encounter (Signed)
Patient has appointment 02/14/18

## 2018-02-08 ENCOUNTER — Encounter: Payer: BLUE CROSS/BLUE SHIELD | Admitting: Family

## 2018-02-14 ENCOUNTER — Ambulatory Visit (INDEPENDENT_AMBULATORY_CARE_PROVIDER_SITE_OTHER): Payer: BLUE CROSS/BLUE SHIELD | Admitting: Family

## 2018-02-14 ENCOUNTER — Other Ambulatory Visit (HOSPITAL_COMMUNITY)
Admission: RE | Admit: 2018-02-14 | Discharge: 2018-02-14 | Disposition: A | Discharge status: Home / Self Care | Payer: BLUE CROSS/BLUE SHIELD | Source: Ambulatory Visit | Attending: Family | Admitting: Family

## 2018-02-14 ENCOUNTER — Encounter: Payer: Self-pay | Admitting: Family

## 2018-02-14 VITALS — BP 120/82 | HR 95 | Temp 97.9°F | Ht 64.0 in | Wt 215.4 lb

## 2018-02-14 DIAGNOSIS — F411 Generalized anxiety disorder: Secondary | ICD-10-CM | POA: Diagnosis not present

## 2018-02-14 DIAGNOSIS — Z0000 Encounter for general adult medical examination without abnormal findings: Secondary | ICD-10-CM | POA: Diagnosis not present

## 2018-02-14 DIAGNOSIS — K2100 Gastro-esophageal reflux disease with esophagitis, without bleeding: Secondary | ICD-10-CM

## 2018-02-14 LAB — CBC WITH DIFFERENTIAL/PLATELET
Basophils Absolute: 0.1 10*3/uL (ref 0.0–0.1)
Basophils Relative: 1 % (ref 0.0–3.0)
Eosinophils Absolute: 0.1 10*3/uL (ref 0.0–0.7)
Eosinophils Relative: 1.5 % (ref 0.0–5.0)
HCT: 41.4 % (ref 36.0–46.0)
Hemoglobin: 13.6 g/dL (ref 12.0–15.0)
Lymphocytes Relative: 32.1 % (ref 12.0–46.0)
Lymphs Abs: 3.2 10*3/uL (ref 0.7–4.0)
MCHC: 32.9 g/dL (ref 30.0–36.0)
MCV: 79.9 fl (ref 78.0–100.0)
Monocytes Absolute: 0.5 10*3/uL (ref 0.1–1.0)
Monocytes Relative: 5.3 % (ref 3.0–12.0)
Neutro Abs: 6 10*3/uL (ref 1.4–7.7)
Neutrophils Relative %: 60.1 % (ref 43.0–77.0)
Platelets: 379 10*3/uL (ref 150.0–400.0)
RBC: 5.18 Mil/uL — ABNORMAL HIGH (ref 3.87–5.11)
RDW: 14.7 % (ref 11.5–15.5)
WBC: 10 10*3/uL (ref 4.0–10.5)

## 2018-02-14 LAB — VITAMIN D 25 HYDROXY (VIT D DEFICIENCY, FRACTURES): VITD: 23.23 ng/mL — ABNORMAL LOW (ref 30.00–100.00)

## 2018-02-14 LAB — COMPREHENSIVE METABOLIC PANEL
ALT: 23 U/L (ref 0–35)
AST: 16 U/L (ref 0–37)
Albumin: 4.1 g/dL (ref 3.5–5.2)
Alkaline Phosphatase: 89 U/L (ref 39–117)
BUN: 10 mg/dL (ref 6–23)
CO2: 26 mEq/L (ref 19–32)
Calcium: 9.4 mg/dL (ref 8.4–10.5)
Chloride: 103 mEq/L (ref 96–112)
Creatinine, Ser: 0.78 mg/dL (ref 0.40–1.20)
GFR: 86.4 mL/min (ref >60.00)
Glucose, Bld: 106 mg/dL — ABNORMAL HIGH (ref 70–99)
Potassium: 3.8 mEq/L (ref 3.5–5.1)
Sodium: 137 mEq/L (ref 135–145)
Total Bilirubin: 0.4 mg/dL (ref 0.2–1.2)
Total Protein: 7.3 g/dL (ref 6.0–8.3)

## 2018-02-14 LAB — HEMOGLOBIN A1C: Hgb A1c MFr Bld: 6.1 % (ref 4.6–6.5)

## 2018-02-14 LAB — LIPID PANEL
Cholesterol: 153 mg/dL (ref 0–200)
HDL: 51 mg/dL (ref >39.00)
LDL Cholesterol: 80 mg/dL (ref 0–99)
NonHDL: 102.14
Total CHOL/HDL Ratio: 3
Triglycerides: 109 mg/dL (ref 0.0–149.0)
VLDL: 21.8 mg/dL (ref 0.0–40.0)

## 2018-02-14 LAB — TSH: TSH: 1.91 u[IU]/mL (ref 0.35–4.50)

## 2018-02-14 NOTE — Assessment & Plan Note (Signed)
Clinical breast exam and Pap smear performed today.  Referral to dermatology based on family history.  Mammogram ordered and patient will schedule.  Screening labs ordered.  Encouraged exercise.

## 2018-02-14 NOTE — Assessment & Plan Note (Signed)
Doing well. Continue current regimen.  

## 2018-02-14 NOTE — Progress Notes (Signed)
Subjective:    Patient ID: Jaime Tate, female    DOB: 1976/06/14, 42 y.o.   MRN: [id number]  CC: Jaime Tate is a 42 y.o. female who presents today for physical exam.    HPI: GERD- on protonix daily; has breakthrough symptoms.   Anxiety- doig well on lexapro. No panic attacks. Likes dose. No si/hi  12/2011 EGD; Dr Gustavo Lah  Colorectal Cancer Screening: No early family history Breast Cancer Screening: Mammogram due 02/2017 Cervical Cancer Screening: due Bone Health screening/DEXA for 65+: No increased fracture risk. Defer screening at this time. Lung Cancer Screening: Doesn't have 30 year pack year history and age > 77 years. Immunizations       Tetanus - utd        Labs:  Screening labs today. Exercise: Gets regular exercise.  Alcohol use: occasional Smoking/tobacco use: Nonsmoker.  Wears seat belt: Yes. Skin: no change in lesions; no h/o skin cancer; family h/o  HISTORY:  Past Medical History:  Diagnosis Date  . Allergy    Seasonal  . Anxiety   . Chicken pox   . GERD (gastroesophageal reflux disease)     No past surgical history on file. Family History  Problem Relation Age of Onset  . Arthritis Mother   . Osteoporosis Mother   . Heart disease Father   . Hypertension Father   . Diabetes Father   . Basal cell carcinoma Father   . Cancer Maternal Grandmother 27       Breast  . Breast cancer Maternal Grandmother 77  . Cancer Paternal Grandmother 67       Colon  . Lung cancer Paternal Grandfather   . Melanoma Paternal Grandfather   . Ovarian cancer Neg Hx       ALLERGIES: Biaxin [clarithromycin]  Current Outpatient Medications on File Prior to Visit  Medication Sig Dispense Refill  . Acetaminophen (TYLENOL PO) Take by mouth.    . cetirizine (ZYRTEC) 10 MG tablet Take 10 mg by mouth daily.    Marland Kitchen escitalopram (LEXAPRO) 10 MG tablet TAKE 1 TABLET BY MOUTH DAILY. 90 tablet 2  . Ibuprofen (ADVIL PO) Take by mouth.    . pantoprazole (PROTONIX) 40 MG tablet TAKE 1 TABLET (40 MG TOTAL) BY MOUTH DAILY. 90 tablet 3   No current facility-administered medications on file prior to visit.     Social History   Tobacco Use  . Smoking status: Never Smoker  . Smokeless tobacco: Never Used  Substance Use Topics  . Alcohol use: Yes    Alcohol/week: 0.0 oz    Comment: 1 drink per week  . Drug use: No    Review of Systems  Constitutional: Negative for chills, fever and unexpected weight change.  HENT: Negative for congestion.   Respiratory: Negative for cough.   Cardiovascular: Negative for chest pain, palpitations and leg swelling.  Gastrointestinal: Negative for abdominal distention, nausea and vomiting.  Genitourinary: Negative for  pelvic pain.  Musculoskeletal: Negative for arthralgias and myalgias.  Skin: Negative for rash.  Neurological: Negative for headaches.  Hematological: Negative for adenopathy.  Psychiatric/Behavioral: Negative for confusion. The patient is not nervous/anxious.       Objective:    BP 120/82 (BP Location: Right Arm, Patient Position: Sitting, Cuff Size: Normal)   Pulse 95   Temp 97.9 F (36.6 C) (Oral)   Ht 5\' 4"  (1.626 m)   Wt 215 lb 6.4 oz (97.7 kg)   SpO2 97%   BMI 36.97 kg/m   BP Readings from Last 3 Encounters:  02/14/18 120/82  01/05/17 118/78  10/24/16 128/80   Wt Readings from Last 3 Encounters:  02/14/18 215 lb 6.4 oz (97.7 kg)  01/05/17 212 lb (96.2 kg)  10/24/16 215 lb 8 oz (97.8 kg)    Physical Exam  Constitutional: She appears well-developed and well-nourished.  Eyes: Conjunctivae are normal.  Neck: No  thyroid mass and no thyromegaly present.  Cardiovascular: Normal rate, regular rhythm, normal heart sounds and normal pulses.  Pulmonary/Chest: Effort normal and breath sounds normal. She has no wheezes. She has no rhonchi. She has no rales. Right breast exhibits no inverted nipple, no mass, no nipple discharge, no skin change and no tenderness. Left breast exhibits no inverted nipple, no mass, no nipple discharge, no skin change and no tenderness. Breasts are symmetrical.  No masses or asymmetry appreciated during CBE.  Genitourinary: Uterus is not enlarged, not fixed and not tender. Cervix exhibits no motion tenderness, no discharge and no friability. Right adnexum displays no mass, no tenderness and no fullness. Left adnexum displays no mass, no tenderness and no fullness.  Genitourinary Comments: Pap performed. No CMT. Unable to appreciated ovaries.  Lymphadenopathy:       Head (right side): No submental, no submandibular, no tonsillar, no preauricular, no posterior auricular and no occipital adenopathy present.       Head (left side): No submental, no  submandibular, no tonsillar, no preauricular, no posterior auricular and no occipital adenopathy present.       Right cervical: No superficial cervical, no deep cervical and no posterior cervical adenopathy present.      Left cervical: No superficial cervical, no deep cervical and no posterior cervical adenopathy present.    She has no axillary adenopathy.       Right axillary: No pectoral and no lateral adenopathy present.       Left axillary: No pectoral and no lateral adenopathy present. Neurological: She is alert.  Skin: Skin is warm and dry.  Psychiatric: She has a normal mood and affect. Her speech is normal and behavior is normal. Thought content normal.  Vitals reviewed.      Assessment & Plan:   Problem List Items Addressed This Visit      Digestive   GERD (gastroesophageal reflux disease)    Discussed long-term risk of PPIs.  We also discussed patient's EGD in 2013.  At this time we jointly agreed we will not pursue repeat EGD however we will discuss next year.  Likely will consult Dr. Gustavo Lah per his recommendations as to repeat/frequency of EGD.  Patient will give trial H2b        Other   Routine general medical examination at a health care facility - Primary    Clinical breast exam and Pap smear performed today.  Referral to dermatology based on family history.  Mammogram ordered and patient will schedule.  Screening labs ordered.  Encouraged exercise.      Relevant Orders   CBC with Differential/Platelet   Comprehensive metabolic panel   Hemoglobin A1c   Lipid panel   Cytology - PAP   TSH   VITAMIN D 25 Hydroxy (Vit-D Deficiency, Fractures)   MM SCREENING BREAST TOMO BILATERAL   Ambulatory referral to Dermatology   Generalized anxiety disorder    Doing well.  Continue current regimen.          I have discontinued Shakira N. Olejnik's busPIRone and gabapentin. I am also having her maintain her cetirizine, Ibuprofen (ADVIL PO), Acetaminophen (TYLENOL PO),  escitalopram, and pantoprazole.   No orders of the defined types were placed in this encounter.   Return precautions given.   Risks, benefits, and alternatives of the medications and treatment plan prescribed today were discussed, and patient expressed understanding.   Education regarding symptom management and diagnosis given to patient on AVS.   Continue to follow with Mable Paris  G, FNP for routine health maintenance.   Abbott Pao Hitsman and I agreed with plan.   Mable Paris, FNP

## 2018-02-14 NOTE — Assessment & Plan Note (Signed)
Discussed long-term risk of PPIs.  We also discussed patient's EGD in 2013.  At this time we jointly agreed we will not pursue repeat EGD however we will discuss next year.  Likely will consult Dr. Gustavo Lah per his recommendations as to repeat/frequency of EGD.  Patient will give trial H2b

## 2018-02-14 NOTE — Patient Instructions (Addendum)
Today we discussed referrals, orders.   I have placed these orders in the system for you.  Please be sure to give [place] a call if you have not heard from our office regarding scheduling a test or regarding referral in a timely manner.  It is very important that you let me know as soon as possible.    Long term use beyond 3 months of proton pump inhibitors , also called PPI's, is associated with malabsorption of vitamins, chronic kidney disease, fracture risk, and diarrheal illnesses. PPI's include Nexium, Prilosec, Protonix, Dexilant, and Prevacid.   I generally recommend trying to control acid reflux with lifestyle modifications including avoiding trigger foods, not eating 2 hours prior to bedtime. You Tsuchiya use histamine 2 blockers daily to twice daily ( this is Zantac, Pepcid) and then when symptoms flare, start back on PPI for short course.    We placed a referral for mammogram this year. I asked that you call one the below locations and schedule this when it is convenient for you.   As discussed, I would like you to ask for 3D mammogram over the traditional 2D mammogram as new evidence suggest 3D is superior.   Please note that NOT all insurance companies cover 3D and you Wyer have to pay a higher copay. You Dacus call your insurance company to further clarify your benefits.   Options for [place], [place]  * [facility] [place] [place] * Note if you ask for 3D mammogram at this location, you must request [place], [place] location*   Health Maintenance, Female Adopting a healthy lifestyle and getting preventive care can go a long way to promote health and wellness. Talk with your health care provider about what schedule of regular examinations is right for you. This is a good chance for you to check in with your provider about  disease prevention and staying healthy. In between checkups, there are plenty of things you can do on your own. Experts have done a lot of research about which lifestyle changes and preventive measures are most likely to keep you healthy. Ask your health care provider for more information. Weight and diet Eat a healthy diet  Be sure to include plenty of vegetables, fruits, low-fat dairy products, and lean protein.  Do not eat a lot of foods high in solid fats, added sugars, or salt.  Get regular exercise. This is one of the most important things you can do for your health. ? Most adults should exercise for at least 150 minutes each week. The exercise should increase your heart rate and make you sweat (moderate-intensity exercise). ? Most adults should also do strengthening exercises at least twice a week. This is in addition to the moderate-intensity exercise.  Maintain a healthy weight  Body mass index (BMI) is a measurement that can be used to identify possible weight problems. It estimates body fat based on height and weight. Your health care provider can help determine your BMI and help you achieve or maintain a healthy weight.  For females 58 years of age and older: ? A BMI below 18.5 is considered underweight. ? A BMI of 18.5 to 24.9 is normal. ? A BMI of 25 to 29.9 is considered overweight. ? A BMI of 30 and above is considered obese.  Watch levels of cholesterol and blood lipids  You should start having your blood tested for lipids and cholesterol at 42 years of age, then have this test every 5 years.  You Dellarocco need to have your cholesterol levels checked more often if: ? Your lipid or cholesterol levels are high. ? You are older than 42 years of age. ? You are at high risk for heart disease.  Cancer screening Lung Cancer  Lung cancer screening is recommended for adults 52-34 years old who are at high risk for lung cancer because of a history of smoking.  A yearly low-dose  CT scan of the lungs is recommended for people who: ? Currently smoke. ? Have quit within the past 15 years. ? Have at least a 30-pack-year history of smoking. A pack year is smoking an average of one pack of cigarettes a day for 1 year.  Yearly screening should continue until it has been 15 years since you quit.  Yearly screening should stop if you develop a health problem that would prevent you from having lung cancer treatment.  Breast Cancer  Practice breast self-awareness. This means understanding how your breasts normally appear and feel.  It also means doing regular breast self-exams. Let your health care provider know about any changes, no matter how small.  If you are in your 20s or 30s, you should have a clinical breast exam (CBE) by a health care provider every 1-3 years as part of a regular health exam.  If you are 51 or older, have a CBE every year. Also consider having a breast X-ray (mammogram) every year.  If you have a family history of breast cancer, talk to your health care provider about genetic screening.  If you are at high risk for breast cancer, talk to your health care provider about having an MRI and a mammogram every year.  Breast cancer gene (BRCA) assessment is recommended for women who have family members with BRCA-related cancers. BRCA-related cancers include: ? Breast. ? Ovarian. ? Tubal. ? Peritoneal cancers.  Results of the assessment will determine the need for genetic counseling and BRCA1 and BRCA2 testing.  Cervical Cancer Your health care provider Palma recommend that you be screened regularly for cancer of the pelvic organs (ovaries, uterus, and vagina). This screening involves a pelvic examination, including checking for microscopic changes to the surface of your cervix (Pap test). You Haymore be encouraged to have this screening done every 3 years, beginning at age 52.  For women ages 67-65, health care providers Palencia recommend pelvic exams and Pap  testing every 3 years, or they Patch recommend the Pap and pelvic exam, combined with testing for human papilloma virus (HPV), every 5 years. Some types of HPV increase your risk of cervical cancer. Testing for HPV Murri also be done on women of any age with unclear Pap test results.  Other health care providers Mascari not recommend any screening for nonpregnant women who are considered low risk for pelvic cancer and who do not have symptoms. Ask your health care provider if a screening pelvic exam is right for you.  If you have had past treatment for cervical cancer or a condition that could lead to cancer, you need Pap tests and screening for cancer for at least 20 years after your treatment. If Pap tests have been discontinued, your risk factors (such as having a new sexual partner) need to be reassessed to determine if screening should resume. Some women have medical problems that increase the chance of getting cervical cancer. In these  cases, your health care provider Gehret recommend more frequent screening and Pap tests.  Colorectal Cancer  This type of cancer can be detected and often prevented.  Routine colorectal cancer screening usually begins at 42 years of age and continues through 42 years of age.  Your health care provider Lapre recommend screening at an earlier age if you have risk factors for colon cancer.  Your health care provider Waight also recommend using home test kits to check for hidden blood in the stool.  A small camera at the end of a tube can be used to examine your colon directly (sigmoidoscopy or colonoscopy). This is done to check for the earliest forms of colorectal cancer.  Routine screening usually begins at age 27.  Direct examination of the colon should be repeated every 5-10 years through 42 years of age. However, you Broden need to be screened more often if early forms of precancerous polyps or small growths are found.  Skin Cancer  Check your skin from head to toe  regularly.  Tell your health care provider about any new moles or changes in moles, especially if there is a change in a mole's shape or color.  Also tell your health care provider if you have a mole that is larger than the size of a pencil eraser.  Always use sunscreen. Apply sunscreen liberally and repeatedly throughout the day.  Protect yourself by wearing long sleeves, pants, a wide-brimmed hat, and sunglasses whenever you are outside.  Heart disease, diabetes, and high blood pressure  High blood pressure causes heart disease and increases the risk of stroke. High blood pressure is more likely to develop in: ? People who have blood pressure in the high end of the normal range (130-139/85-89 mm Hg). ? People who are overweight or obese. ? People who are African American.  If you are 12-78 years of age, have your blood pressure checked every 3-5 years. If you are 58 years of age or older, have your blood pressure checked every year. You should have your blood pressure measured twice-once when you are at a hospital or clinic, and once when you are not at a hospital or clinic. Record the average of the two measurements. To check your blood pressure when you are not at a hospital or clinic, you can use: ? An automated blood pressure machine at a pharmacy. ? A home blood pressure monitor.  If you are between 63 years and 36 years old, ask your health care provider if you should take aspirin to prevent strokes.  Have regular diabetes screenings. This involves taking a blood sample to check your fasting blood sugar level. ? If you are at a normal weight and have a low risk for diabetes, have this test once every three years after 42 years of age. ? If you are overweight and have a high risk for diabetes, consider being tested at a younger age or more often. Preventing infection Hepatitis B  If you have a higher risk for hepatitis B, you should be screened for this virus. You are considered  at high risk for hepatitis B if: ? You were born in a country where hepatitis B is common. Ask your health care provider which countries are considered high risk. ? Your parents were born in a high-risk country, and you have not been immunized against hepatitis B (hepatitis B vaccine). ? You have HIV or AIDS. ? You use needles to inject street drugs. ? You live with someone who has  hepatitis B. ? You have had sex with someone who has hepatitis B. ? You get hemodialysis treatment. ? You take certain medicines for conditions, including cancer, organ transplantation, and autoimmune conditions.  Hepatitis C  Blood testing is recommended for: ? Everyone born from [birth year] through 1965. ? Anyone with known risk factors for hepatitis C.  Sexually transmitted infections (STIs)  You should be screened for sexually transmitted infections (STIs) including gonorrhea and chlamydia if: ? You are sexually active and are younger than 42 years of age. ? You are older than 42 years of age and your health care provider tells you that you are at risk for this type of infection. ? Your sexual activity has changed since you were last screened and you are at an increased risk for chlamydia or gonorrhea. Ask your health care provider if you are at risk.  If you do not have HIV, but are at risk, it Brugh be recommended that you take a prescription medicine daily to prevent HIV infection. This is called pre-exposure prophylaxis (PrEP). You are considered at risk if: ? You are sexually active and do not regularly use condoms or know the HIV status of your partner(s). ? You take drugs by injection. ? You are sexually active with a partner who has HIV.  Talk with your health care provider about whether you are at high risk of being infected with HIV. If you choose to begin PrEP, you should first be tested for HIV. You should then be tested every 3 months for as long as you are taking PrEP. Pregnancy  If you are  premenopausal and you Niess become pregnant, ask your health care provider about preconception counseling.  If you Luberto become pregnant, take 400 to 800 micrograms (mcg) of folic acid every day.  If you want to prevent pregnancy, talk to your health care provider about birth control (contraception). Osteoporosis and menopause  Osteoporosis is a disease in which the bones lose minerals and strength with aging. This can result in serious bone fractures. Your risk for osteoporosis can be identified using a bone density scan.  If you are 3 years of age or older, or if you are at risk for osteoporosis and fractures, ask your health care provider if you should be screened.  Ask your health care provider whether you should take a calcium or vitamin D supplement to lower your risk for osteoporosis.  Menopause Matters have certain physical symptoms and risks.  Hormone replacement therapy Thau reduce some of these symptoms and risks. Talk to your health care provider about whether hormone replacement therapy is right for you. Follow these instructions at home:  Schedule regular health, dental, and eye exams.  Stay current with your immunizations.  Do not use any tobacco products including cigarettes, chewing tobacco, or electronic cigarettes.  If you are pregnant, do not drink alcohol.  If you are breastfeeding, limit how much and how often you drink alcohol.  Limit alcohol intake to no more than 1 drink per day for nonpregnant women. One drink equals 12 ounces of beer, 5 ounces of wine, or 1 ounces of hard liquor.  Do not use street drugs.  Do not share needles.  Ask your health care provider for help if you need support or information about quitting drugs.  Tell your health care provider if you often feel depressed.  Tell your health care provider if you have ever been abused or do not feel safe at home. This information is not intended  to replace advice given to you by your health care  provider. Make sure you discuss any questions you have with your health care provider. Document Released: 06/06/2011 Document Revised: 04/28/2016 Document Reviewed: 08/25/2015 Elsevier Interactive Patient Education  [employer].

## 2018-02-15 LAB — CYTOLOGY - PAP
Diagnosis: NEGATIVE
HPV: NOT DETECTED

## 2018-05-03 ENCOUNTER — Ambulatory Visit
Admission: RE | Admit: 2018-05-03 | Discharge: 2018-05-03 | Disposition: A | Discharge status: Home / Self Care | Payer: BLUE CROSS/BLUE SHIELD | Source: Ambulatory Visit | Attending: Family | Admitting: Family

## 2018-05-03 DIAGNOSIS — Z1231 Encounter for screening mammogram for malignant neoplasm of breast: Secondary | ICD-10-CM | POA: Diagnosis not present

## 2018-05-03 DIAGNOSIS — Z0000 Encounter for general adult medical examination without abnormal findings: Secondary | ICD-10-CM | POA: Diagnosis not present

## 2018-05-09 ENCOUNTER — Other Ambulatory Visit: Payer: Self-pay | Admitting: Family

## 2018-10-31 ENCOUNTER — Other Ambulatory Visit: Payer: Self-pay | Admitting: Family

## 2019-01-14 ENCOUNTER — Encounter: Payer: Self-pay | Admitting: Family

## 2019-01-14 ENCOUNTER — Ambulatory Visit (INDEPENDENT_AMBULATORY_CARE_PROVIDER_SITE_OTHER): Payer: BLUE CROSS/BLUE SHIELD | Admitting: Family

## 2019-01-14 VITALS — BP 118/78 | HR 87 | Temp 98.0°F | Ht 64.0 in | Wt 221.0 lb

## 2019-01-14 DIAGNOSIS — F411 Generalized anxiety disorder: Secondary | ICD-10-CM | POA: Diagnosis not present

## 2019-01-14 DIAGNOSIS — Z0000 Encounter for general adult medical examination without abnormal findings: Secondary | ICD-10-CM | POA: Diagnosis not present

## 2019-01-14 DIAGNOSIS — K2100 Gastro-esophageal reflux disease with esophagitis, without bleeding: Secondary | ICD-10-CM

## 2019-01-14 LAB — COMPREHENSIVE METABOLIC PANEL
ALT: 17 U/L (ref 0–35)
AST: 17 U/L (ref 0–37)
Albumin: 4.2 g/dL (ref 3.5–5.2)
Alkaline Phosphatase: 105 U/L (ref 39–117)
BUN: 10 mg/dL (ref 6–23)
CO2: 25 mEq/L (ref 19–32)
Calcium: 9.2 mg/dL (ref 8.4–10.5)
Chloride: 104 mEq/L (ref 96–112)
Creatinine, Ser: 0.81 mg/dL (ref 0.40–1.20)
GFR: 77.48 mL/min (ref >60.00)
Glucose, Bld: 105 mg/dL — ABNORMAL HIGH (ref 70–99)
Potassium: 3.7 mEq/L (ref 3.5–5.1)
Sodium: 138 mEq/L (ref 135–145)
Total Bilirubin: 0.3 mg/dL (ref 0.2–1.2)
Total Protein: 7.3 g/dL (ref 6.0–8.3)

## 2019-01-14 LAB — CBC WITH DIFFERENTIAL/PLATELET
Basophils Absolute: 0.1 10*3/uL (ref 0.0–0.1)
Basophils Relative: 0.7 % (ref 0.0–3.0)
Eosinophils Absolute: 0.2 10*3/uL (ref 0.0–0.7)
Eosinophils Relative: 1.7 % (ref 0.0–5.0)
HCT: 40.1 % (ref 36.0–46.0)
Hemoglobin: 13.1 g/dL (ref 12.0–15.0)
Lymphocytes Relative: 39.7 % (ref 12.0–46.0)
Lymphs Abs: 3.8 10*3/uL (ref 0.7–4.0)
MCHC: 32.7 g/dL (ref 30.0–36.0)
MCV: 78.9 fl (ref 78.0–100.0)
Monocytes Absolute: 0.4 10*3/uL (ref 0.1–1.0)
Monocytes Relative: 4.6 % (ref 3.0–12.0)
Neutro Abs: 5.1 10*3/uL (ref 1.4–7.7)
Neutrophils Relative %: 53.3 % (ref 43.0–77.0)
Platelets: 368 10*3/uL (ref 150.0–400.0)
RBC: 5.08 Mil/uL (ref 3.87–5.11)
RDW: 14.7 % (ref 11.5–15.5)
WBC: 9.5 10*3/uL (ref 4.0–10.5)

## 2019-01-14 LAB — LIPID PANEL
Cholesterol: 175 mg/dL (ref 0–200)
HDL: 43.1 mg/dL (ref >39.00)
LDL Cholesterol: 109 mg/dL — ABNORMAL HIGH (ref 0–99)
NonHDL: 132.1
Total CHOL/HDL Ratio: 4
Triglycerides: 116 mg/dL (ref 0.0–149.0)
VLDL: 23.2 mg/dL (ref 0.0–40.0)

## 2019-01-14 LAB — TSH: TSH: 2.52 u[IU]/mL (ref 0.35–4.50)

## 2019-01-14 LAB — HEMOGLOBIN A1C: Hgb A1c MFr Bld: 6.1 % (ref 4.6–6.5)

## 2019-01-14 LAB — VITAMIN D 25 HYDROXY (VIT D DEFICIENCY, FRACTURES): VITD: 46.29 ng/mL (ref 30.00–100.00)

## 2019-01-14 NOTE — Assessment & Plan Note (Addendum)
Overall doing well on protonix. Declines repeat egd, referral. Education provided on alarm symptoms. Patient will remain vigilant.

## 2019-01-14 NOTE — Assessment & Plan Note (Signed)
Clinical breast exam performed, mammogram is up-to-date.  Deferred pelvic exam the absence complaints, Pap is up-to-date.

## 2019-01-14 NOTE — Assessment & Plan Note (Signed)
Doing well on regimen, will continue

## 2019-01-14 NOTE — Patient Instructions (Addendum)
Let us continue to be vigilant in regards to acid reflux as we discussed again today.  Let me know any time if you would like to be referred back to gastroenterology.    Nice to see you!  Health Maintenance, Female Adopting a healthy lifestyle and getting preventive care can go a long way to promote health and wellness. Talk with your health care provider about what schedule of regular examinations is right for you. This is a good chance for you to check in with your provider about disease prevention and staying healthy. In between checkups, there are plenty of things you can do on your own. Experts have done a lot of research about which lifestyle changes and preventive measures are most likely to keep you healthy. Ask your health care provider for more information. Weight and diet Eat a healthy diet  Be sure to include plenty of vegetables, fruits, low-fat dairy products, and lean protein.  Do not eat a lot of foods high in solid fats, added sugars, or salt.  Get regular exercise. This is one of the most important things you can do for your health. ? Most adults should exercise for at least 150 minutes each week. The exercise should increase your heart rate and make you sweat (moderate-intensity exercise). ? Most adults should also do strengthening exercises at least twice a week. This is in addition to the moderate-intensity exercise. Maintain a healthy weight  Body mass index (BMI) is a measurement that can be used to identify possible weight problems. It estimates body fat based on height and weight. Your health care provider can help determine your BMI and help you achieve or maintain a healthy weight.  For females 87 years of age and older: ? A BMI below 18.5 is considered underweight. ? A BMI of 18.5 to 24.9 is normal. ? A BMI of 25 to 29.9 is considered overweight. ? A BMI of 30 and above is considered obese. Watch levels of cholesterol and blood lipids  You should start having  your blood tested for lipids and cholesterol at 43 years of age, then have this test every 5 years.  You Rittenberry need to have your cholesterol levels checked more often if: ? Your lipid or cholesterol levels are high. ? You are older than 43 years of age. ? You are at high risk for heart disease. Cancer screening Lung Cancer  Lung cancer screening is recommended for adults 70-31 years old who are at high risk for lung cancer because of a history of smoking.  A yearly low-dose CT scan of the lungs is recommended for people who: ? Currently smoke. ? Have quit within the past 15 years. ? Have at least a 30-pack-year history of smoking. A pack year is smoking an average of one pack of cigarettes a day for 1 year.  Yearly screening should continue until it has been 15 years since you quit.  Yearly screening should stop if you develop a health problem that would prevent you from having lung cancer treatment. Breast Cancer  Practice breast self-awareness. This means understanding how your breasts normally appear and feel.  It also means doing regular breast self-exams. Let your health care provider know about any changes, no matter how small.  If you are in your 20s or 30s, you should have a clinical breast exam (CBE) by a health care provider every 1-3 years as part of a regular health exam.  If you are 50 or older, have a CBE every  year. Also consider having a breast X-ray (mammogram) every year.  If you have a family history of breast cancer, talk to your health care provider about genetic screening.  If you are at high risk for breast cancer, talk to your health care provider about having an MRI and a mammogram every year.  Breast cancer gene (BRCA) assessment is recommended for women who have family members with BRCA-related cancers. BRCA-related cancers include: ? Breast. ? Ovarian. ? Tubal. ? Peritoneal cancers.  Results of the assessment will determine the need for genetic  counseling and BRCA1 and BRCA2 testing. Cervical Cancer Your health care provider Zanders recommend that you be screened regularly for cancer of the pelvic organs (ovaries, uterus, and vagina). This screening involves a pelvic examination, including checking for microscopic changes to the surface of your cervix (Pap test). You Elias be encouraged to have this screening done every 3 years, beginning at age 27.  For women ages 42-65, health care providers Lydon recommend pelvic exams and Pap testing every 3 years, or they Laraia recommend the Pap and pelvic exam, combined with testing for human papilloma virus (HPV), every 5 years. Some types of HPV increase your risk of cervical cancer. Testing for HPV Stillion also be done on women of any age with unclear Pap test results.  Other health care providers Diffee not recommend any screening for nonpregnant women who are considered low risk for pelvic cancer and who do not have symptoms. Ask your health care provider if a screening pelvic exam is right for you.  If you have had past treatment for cervical cancer or a condition that could lead to cancer, you need Pap tests and screening for cancer for at least 20 years after your treatment. If Pap tests have been discontinued, your risk factors (such as having a new sexual partner) need to be reassessed to determine if screening should resume. Some women have medical problems that increase the chance of getting cervical cancer. In these cases, your health care provider Vaeth recommend more frequent screening and Pap tests. Colorectal Cancer  This type of cancer can be detected and often prevented.  Routine colorectal cancer screening usually begins at 44 years of age and continues through 43 years of age.  Your health care provider Sigal recommend screening at an earlier age if you have risk factors for colon cancer.  Your health care provider Ayala also recommend using home test kits to check for hidden blood in the stool.  A  small camera at the end of a tube can be used to examine your colon directly (sigmoidoscopy or colonoscopy). This is done to check for the earliest forms of colorectal cancer.  Routine screening usually begins at age 40.  Direct examination of the colon should be repeated every 5-10 years through 43 years of age. However, you Benton need to be screened more often if early forms of precancerous polyps or small growths are found. Skin Cancer  Check your skin from head to toe regularly.  Tell your health care provider about any new moles or changes in moles, especially if there is a change in a mole's shape or color.  Also tell your health care provider if you have a mole that is larger than the size of a pencil eraser.  Always use sunscreen. Apply sunscreen liberally and repeatedly throughout the day.  Protect yourself by wearing long sleeves, pants, a wide-brimmed hat, and sunglasses whenever you are outside. Heart disease, diabetes, and high blood pressure  High blood pressure causes heart disease and increases the risk of stroke. High blood pressure is more likely to develop in: ? People who have blood pressure in the high end of the normal range (130-139/85-89 mm Hg). ? People who are overweight or obese. ? People who are African American.  If you are 36-14 years of age, have your blood pressure checked every 3-5 years. If you are 79 years of age or older, have your blood pressure checked every year. You should have your blood pressure measured twice-once when you are at a hospital or clinic, and once when you are not at a hospital or clinic. Record the average of the two measurements. To check your blood pressure when you are not at a hospital or clinic, you can use: ? An automated blood pressure machine at a pharmacy. ? A home blood pressure monitor.  If you are between 70 years and 36 years old, ask your health care provider if you should take aspirin to prevent strokes.  Have regular  diabetes screenings. This involves taking a blood sample to check your fasting blood sugar level. ? If you are at a normal weight and have a low risk for diabetes, have this test once every three years after 43 years of age. ? If you are overweight and have a high risk for diabetes, consider being tested at a younger age or more often. Preventing infection Hepatitis B  If you have a higher risk for hepatitis B, you should be screened for this virus. You are considered at high risk for hepatitis B if: ? You were born in a country where hepatitis B is common. Ask your health care provider which countries are considered high risk. ? Your parents were born in a high-risk country, and you have not been immunized against hepatitis B (hepatitis B vaccine). ? You have HIV or AIDS. ? You use needles to inject street drugs. ? You live with someone who has hepatitis B. ? You have had sex with someone who has hepatitis B. ? You get hemodialysis treatment. ? You take certain medicines for conditions, including cancer, organ transplantation, and autoimmune conditions. Hepatitis C  Blood testing is recommended for: ? Everyone born from [birth year] through 1965. ? Anyone with known risk factors for hepatitis C. Sexually transmitted infections (STIs)  You should be screened for sexually transmitted infections (STIs) including gonorrhea and chlamydia if: ? You are sexually active and are younger than 43 years of age. ? You are older than 43 years of age and your health care provider tells you that you are at risk for this type of infection. ? Your sexual activity has changed since you were last screened and you are at an increased risk for chlamydia or gonorrhea. Ask your health care provider if you are at risk.  If you do not have HIV, but are at risk, it Hornbeck be recommended that you take a prescription medicine daily to prevent HIV infection. This is called pre-exposure prophylaxis (PrEP). You are considered at  risk if: ? You are sexually active and do not regularly use condoms or know the HIV status of your partner(s). ? You take drugs by injection. ? You are sexually active with a partner who has HIV. Talk with your health care provider about whether you are at high risk of being infected with HIV. If you choose to begin PrEP, you should first be tested for HIV. You should then be tested every 3 months for as long  as you are taking PrEP. Pregnancy  If you are premenopausal and you Hooton become pregnant, ask your health care provider about preconception counseling.  If you Chandler become pregnant, take 400 to 800 micrograms (mcg) of folic acid every day.  If you want to prevent pregnancy, talk to your health care provider about birth control (contraception). Osteoporosis and menopause  Osteoporosis is a disease in which the bones lose minerals and strength with aging. This can result in serious bone fractures. Your risk for osteoporosis can be identified using a bone density scan.  If you are 70 years of age or older, or if you are at risk for osteoporosis and fractures, ask your health care provider if you should be screened.  Ask your health care provider whether you should take a calcium or vitamin D supplement to lower your risk for osteoporosis.  Menopause Renbarger have certain physical symptoms and risks.  Hormone replacement therapy Schoch reduce some of these symptoms and risks. Talk to your health care provider about whether hormone replacement therapy is right for you. Follow these instructions at home:  Schedule regular health, dental, and eye exams.  Stay current with your immunizations.  Do not use any tobacco products including cigarettes, chewing tobacco, or electronic cigarettes.  If you are pregnant, do not drink alcohol.  If you are breastfeeding, limit how much and how often you drink alcohol.  Limit alcohol intake to no more than 1 drink per day for nonpregnant women. One drink  equals 12 ounces of beer, 5 ounces of wine, or 1 ounces of hard liquor.  Do not use street drugs.  Do not share needles.  Ask your health care provider for help if you need support or information about quitting drugs.  Tell your health care provider if you often feel depressed.  Tell your health care provider if you have ever been abused or do not feel safe at home. This information is not intended to replace advice given to you by your health care provider. Make sure you discuss any questions you have with your health care provider. Document Released: 06/06/2011 Document Revised: 04/28/2016 Document Reviewed: 08/25/2015 Elsevier Interactive Patient Education  2019 [employer].

## 2019-01-14 NOTE — Progress Notes (Signed)
Subjective:    Patient ID: Jaime Tate, female    DOB: 1976/03/11, 43 y.o.   MRN: [id number]  CC: Jaime Tate is a 43 y.o. female who presents today for physical exam.    HPI: Feels well today, no complaints.    GERD- Continues to have breakthrough epigastric burning if eats late or eats something spicy, which is 'rare'. Feels well controlled. No trouble swallowing. time for repeat egd  Depression- doing well on lexapro. No si/hi  On vitamin D    Colorectal Cancer Screening: no early family history Breast Cancer Screening: Mammogram UTD Cervical Cancer Screening: UTD Bone Health screening/DEXA for 65+: No increased fracture risk. Defer screening at this time Lung Cancer Screening: Doesn't have 30 year pack year history and age > 101 years       Tetanus - utd         Labs: Screening labs today. Exercise: Gets regular exercise, walks primarily.  Alcohol use: one per week Smoking/tobacco use: Nonsmoker.  Regular dental exams: UTD Wears seat belt: Yes.  HISTORY:  Past Medical History:  Diagnosis Date  . Allergy    Seasonal  . Anxiety   . Chicken pox   . GERD (gastroesophageal reflux disease)     History reviewed. No pertinent surgical history. Family History  Problem Relation Age of Onset  . Arthritis Mother   . Osteoporosis Mother   . Heart disease Father   . Hypertension Father   . Diabetes Father   . Basal cell carcinoma Father   . Cancer Maternal Grandmother 49       Breast  . Breast cancer Maternal Grandmother 65  . Cancer Paternal Grandmother 89       Colon  . Lung cancer Paternal Grandfather   . Melanoma Paternal Grandfather   . Ovarian cancer Neg Hx       ALLERGIES: Biaxin [clarithromycin]  Current Outpatient Medications on File Prior to Visit  Medication Sig Dispense Refill  . Acetaminophen (TYLENOL PO) Take by mouth.    . cetirizine (ZYRTEC) 10 MG tablet Take 10 mg by mouth daily.    Marland Kitchen escitalopram (LEXAPRO) 10 MG tablet TAKE 1 TABLET  BY MOUTH EVERY DAY 90 tablet 2  . Ibuprofen (ADVIL PO) Take by mouth.    . pantoprazole (PROTONIX) 40 MG tablet TAKE 1 TABLET BY MOUTH EVERY DAY 90 tablet 3   No current facility-administered medications on file prior to visit.     Social History   Tobacco Use  . Smoking status: Never Smoker  . Smokeless tobacco: Never Used  Substance Use Topics  . Alcohol use: Yes    Alcohol/week: 0.0 standard drinks    Comment: 1 drink per week  . Drug use: No    Review of Systems  Constitutional: Negative for chills, fever and unexpected weight change.  HENT: Negative for congestion and trouble swallowing.   Respiratory: Negative for cough.   Cardiovascular: Negative for chest pain, palpitations and leg swelling.  Gastrointestinal: Negative for abdominal pain, nausea and vomiting.  Genitourinary: Negative for dysuria and vaginal pain.  Musculoskeletal: Negative for arthralgias and myalgias.  Skin: Negative for rash.  Neurological: Negative for headaches.  Hematological: Negative for adenopathy.  Psychiatric/Behavioral: Negative for confusion.      Objective:    BP 118/78 (BP Location: Left Arm, Patient Position: Sitting, Cuff Size: Large)   Pulse 87   Temp 98 F (36.7 C)   Ht 5\' 4"  (1.626 m)   Wt 221 lb (  100.2 kg)   SpO2 97%   BMI 37.93 kg/m   BP Readings from Last 3 Encounters:  01/14/19 118/78  02/14/18 120/82  01/05/17 118/78   Wt Readings from Last 3 Encounters:  01/14/19 221 lb (100.2 kg)  02/14/18 215 lb 6.4 oz (97.7 kg)  01/05/17 212 lb (96.2 kg)    Physical Exam Vitals signs reviewed.  Constitutional:      Appearance: She is well-developed.  Eyes:     Conjunctiva/sclera: Conjunctivae normal.  Neck:     Thyroid: No thyroid mass or thyromegaly.  Cardiovascular:     Rate and Rhythm: Normal rate and regular rhythm.     Pulses: Normal pulses.     Heart sounds: Normal heart sounds.  Pulmonary:     Effort: Pulmonary effort is normal.     Breath sounds: Normal  breath sounds. No wheezing, rhonchi or rales.  Chest:     Breasts: Breasts are symmetrical.        Right: No inverted nipple, mass, nipple discharge, skin change or tenderness.        Left: No inverted nipple, mass, nipple discharge, skin change or tenderness.  Lymphadenopathy:     Head:     Right side of head: No submental, submandibular, tonsillar, preauricular, posterior auricular or occipital adenopathy.     Left side of head: No submental, submandibular, tonsillar, preauricular, posterior auricular or occipital adenopathy.     Cervical: No cervical adenopathy.     Right cervical: No superficial, deep or posterior cervical adenopathy.    Left cervical: No superficial, deep or posterior cervical adenopathy.  Skin:    General: Skin is warm and dry.  Neurological:     Mental Status: She is alert.  Psychiatric:        Speech: Speech normal.        Behavior: Behavior normal.        Thought Content: Thought content normal.        Assessment & Plan:   Problem List Items Addressed This Visit      Digestive   GERD (gastroesophageal reflux disease)    Overall doing well on protonix. Declines repeat egd, referral. Education provided on alarm symptoms. Patient will remain vigilant.         Other   Routine general medical examination at a health care facility - Primary    Clinical breast exam performed, mammogram is up-to-date.  Deferred pelvic exam the absence complaints, Pap is up-to-date.      Relevant Orders   TSH   CBC with Differential/Platelet   Comprehensive metabolic panel   Hemoglobin A1c   Lipid panel   VITAMIN D 25 Hydroxy (Vit-D Deficiency, Fractures)   Generalized anxiety disorder    Doing well on regimen, will continue          I am having Jaime Tate maintain her cetirizine, Ibuprofen (ADVIL PO), Acetaminophen (TYLENOL PO), escitalopram, and pantoprazole.   No orders of the defined types were placed in this encounter.   Return precautions given.    Risks, benefits, and alternatives of the medications and treatment plan prescribed today were discussed, and patient expressed understanding.   Education regarding symptom management and diagnosis given to patient on AVS.   Continue to follow with Burnard Hawthorne, FNP for routine health maintenance.   Jaime Tate and I agreed with plan.   Mable Paris, FNP

## 2019-01-31 ENCOUNTER — Other Ambulatory Visit: Payer: Self-pay | Admitting: Family

## 2019-04-24 ENCOUNTER — Other Ambulatory Visit: Payer: Self-pay | Admitting: Family

## 2019-04-24 DIAGNOSIS — Z1231 Encounter for screening mammogram for malignant neoplasm of breast: Secondary | ICD-10-CM

## 2019-05-13 ENCOUNTER — Other Ambulatory Visit: Payer: Self-pay

## 2019-05-13 ENCOUNTER — Ambulatory Visit
Admission: RE | Admit: 2019-05-13 | Discharge: 2019-05-13 | Disposition: A | Discharge status: Home / Self Care | Payer: BC Managed Care – PPO | Source: Ambulatory Visit | Attending: Family | Admitting: Family

## 2019-05-13 DIAGNOSIS — Z1231 Encounter for screening mammogram for malignant neoplasm of breast: Secondary | ICD-10-CM | POA: Insufficient documentation

## 2019-09-26 ENCOUNTER — Other Ambulatory Visit: Payer: Self-pay

## 2019-09-26 ENCOUNTER — Telehealth: Payer: Self-pay

## 2019-09-26 NOTE — Telephone Encounter (Signed)
Copied from [place] [phone]. Topic: Quick Communication - See Telephone Encounter >> Sep 26, 2019 12:08 PM Loma Boston wrote: CRM for notification. See Telephone encounter for: 09/26/19. Pt stated at last CPE in FEB. Arnett had stated that her heartburn med was about to run out and to just call her for another referral to the GI dr. that she has previously seen at [facility] and she would give them a call. Pt states that if she needs to come back in since it has been awhile she will understand but to FU with her as she thinks she does need to go back to GI. Recap do referral or FU

## 2019-09-27 ENCOUNTER — Other Ambulatory Visit: Payer: Self-pay | Admitting: Family

## 2019-09-27 ENCOUNTER — Other Ambulatory Visit: Payer: Self-pay

## 2019-09-27 MED ORDER — PANTOPRAZOLE SODIUM 40 MG PO TBEC: 40.0000 mg | DELAYED_RELEASE_TABLET | Freq: Every day | ORAL | 0 refills | Status: DC

## 2019-09-27 NOTE — Telephone Encounter (Signed)
Yes can refill for 30 tab with one refill  She should make appt here or with GI

## 2019-09-27 NOTE — Telephone Encounter (Signed)
I called patient to advise on below. Patient stated that she had seen [facility] before Dr. Bary Leriche. I asked that she try to see since she had been established to try to make an appointment there. I did let her know we refilled protonix & that if she had any issues to let us know.

## 2019-10-01 ENCOUNTER — Ambulatory Visit (INDEPENDENT_AMBULATORY_CARE_PROVIDER_SITE_OTHER): Payer: BC Managed Care – PPO | Admitting: *Deleted

## 2019-10-01 ENCOUNTER — Other Ambulatory Visit: Payer: Self-pay

## 2019-10-01 DIAGNOSIS — Z23 Encounter for immunization: Secondary | ICD-10-CM | POA: Diagnosis not present

## 2019-10-06 ENCOUNTER — Other Ambulatory Visit: Payer: Self-pay | Admitting: Family

## 2019-10-08 NOTE — Telephone Encounter (Signed)
Last refill 01/31/19 last OV 01/14/19 Ok to fill?

## 2019-10-09 ENCOUNTER — Telehealth: Payer: Self-pay | Admitting: Family

## 2019-10-09 ENCOUNTER — Other Ambulatory Visit: Payer: Self-pay

## 2019-10-09 MED ORDER — PANTOPRAZOLE SODIUM 40 MG PO TBEC: 40.0000 mg | DELAYED_RELEASE_TABLET | Freq: Every day | ORAL | 0 refills | Status: DC

## 2019-10-09 NOTE — Telephone Encounter (Signed)
Refill sent in to [facility].

## 2019-10-09 NOTE — Telephone Encounter (Signed)
RX REFILL pantoprazole (PROTONIX) 40 MG tablet Patient needs a 90 day supply of medications.  Patient is returning 30 day supply.  Patient insurance only covers 90 days. PHARMACY [id number] - [place], [place] [phone] (Phone) [phone] (Fax)

## 2019-12-31 ENCOUNTER — Other Ambulatory Visit: Payer: Self-pay | Admitting: Family

## 2020-02-04 ENCOUNTER — Encounter: Payer: Self-pay | Admitting: Family

## 2020-02-04 ENCOUNTER — Ambulatory Visit (INDEPENDENT_AMBULATORY_CARE_PROVIDER_SITE_OTHER): Payer: BC Managed Care – PPO | Admitting: Family

## 2020-02-04 ENCOUNTER — Other Ambulatory Visit: Payer: Self-pay

## 2020-02-04 VITALS — BP 112/72 | HR 94 | Temp 98.1°F | Ht 64.0 in | Wt 226.6 lb

## 2020-02-04 DIAGNOSIS — K2100 Gastro-esophageal reflux disease with esophagitis, without bleeding: Secondary | ICD-10-CM | POA: Diagnosis not present

## 2020-02-04 DIAGNOSIS — Z0000 Encounter for general adult medical examination without abnormal findings: Secondary | ICD-10-CM

## 2020-02-04 DIAGNOSIS — F411 Generalized anxiety disorder: Secondary | ICD-10-CM | POA: Diagnosis not present

## 2020-02-04 LAB — COMPREHENSIVE METABOLIC PANEL
ALT: 20 U/L (ref 0–35)
AST: 15 U/L (ref 0–37)
Albumin: 4 g/dL (ref 3.5–5.2)
Alkaline Phosphatase: 105 U/L (ref 39–117)
BUN: 8 mg/dL (ref 6–23)
CO2: 25 mEq/L (ref 19–32)
Calcium: 9.2 mg/dL (ref 8.4–10.5)
Chloride: 104 mEq/L (ref 96–112)
Creatinine, Ser: 0.78 mg/dL (ref 0.40–1.20)
GFR: 80.53 mL/min (ref >60.00)
Glucose, Bld: 106 mg/dL — ABNORMAL HIGH (ref 70–99)
Potassium: 3.6 mEq/L (ref 3.5–5.1)
Sodium: 137 mEq/L (ref 135–145)
Total Bilirubin: 0.3 mg/dL (ref 0.2–1.2)
Total Protein: 7.5 g/dL (ref 6.0–8.3)

## 2020-02-04 LAB — CBC WITH DIFFERENTIAL/PLATELET
Basophils Absolute: 0.1 10*3/uL (ref 0.0–0.1)
Basophils Relative: 0.9 % (ref 0.0–3.0)
Eosinophils Absolute: 0.2 10*3/uL (ref 0.0–0.7)
Eosinophils Relative: 2.3 % (ref 0.0–5.0)
HCT: 37.3 % (ref 36.0–46.0)
Hemoglobin: 12 g/dL (ref 12.0–15.0)
Lymphocytes Relative: 34.6 % (ref 12.0–46.0)
Lymphs Abs: 3.6 10*3/uL (ref 0.7–4.0)
MCHC: 32.1 g/dL (ref 30.0–36.0)
MCV: 73.4 fl — ABNORMAL LOW (ref 78.0–100.0)
Monocytes Absolute: 0.4 10*3/uL (ref 0.1–1.0)
Monocytes Relative: 4.2 % (ref 3.0–12.0)
Neutro Abs: 6 10*3/uL (ref 1.4–7.7)
Neutrophils Relative %: 58 % (ref 43.0–77.0)
Platelets: 396 10*3/uL (ref 150.0–400.0)
RBC: 5.08 Mil/uL (ref 3.87–5.11)
RDW: 17.6 % — ABNORMAL HIGH (ref 11.5–15.5)
WBC: 10.3 10*3/uL (ref 4.0–10.5)

## 2020-02-04 LAB — LIPID PANEL
Cholesterol: 182 mg/dL (ref 0–200)
HDL: 48.8 mg/dL (ref >39.00)
LDL Cholesterol: 115 mg/dL — ABNORMAL HIGH (ref 0–99)
NonHDL: 132.88
Total CHOL/HDL Ratio: 4
Triglycerides: 90 mg/dL (ref 0.0–149.0)
VLDL: 18 mg/dL (ref 0.0–40.0)

## 2020-02-04 LAB — VITAMIN D 25 HYDROXY (VIT D DEFICIENCY, FRACTURES): VITD: 36.87 ng/mL (ref 30.00–100.00)

## 2020-02-04 LAB — HEMOGLOBIN A1C: Hgb A1c MFr Bld: 6.3 % (ref 4.6–6.5)

## 2020-02-04 LAB — TSH: TSH: 1.83 u[IU]/mL (ref 0.35–4.50)

## 2020-02-04 NOTE — Patient Instructions (Signed)
Nice to see you! ° ° °Health Maintenance, Female °Adopting a healthy lifestyle and getting preventive care are important in promoting health and wellness. Ask your health care provider about: °· The right schedule for you to have regular tests and exams. °· Things you can do on your own to prevent diseases and keep yourself healthy. °What should I know about diet, weight, and exercise? °Eat a healthy diet ° °· Eat a diet that includes plenty of vegetables, fruits, low-fat dairy products, and lean protein. °· Do not eat a lot of foods that are high in solid fats, added sugars, or sodium. °Maintain a healthy weight °Body mass index (BMI) is used to identify weight problems. It estimates body fat based on height and weight. Your health care provider can help determine your BMI and help you achieve or maintain a healthy weight. °Get regular exercise °Get regular exercise. This is one of the most important things you can do for your health. Most adults should: °· Exercise for at least 150 minutes each week. The exercise should increase your heart rate and make you sweat (moderate-intensity exercise). °· Do strengthening exercises at least twice a week. This is in addition to the moderate-intensity exercise. °· Spend less time sitting. Even light physical activity can be beneficial. °Watch cholesterol and blood lipids °Have your blood tested for lipids and cholesterol at 44 years of age, then have this test every 5 years. °Have your cholesterol levels checked more often if: °· Your lipid or cholesterol levels are high. °· You are older than 44 years of age. °· You are at high risk for heart disease. °What should I know about cancer screening? °Depending on your health history and family history, you Tesfaye need to have cancer screening at various ages. This [place] include screening for: °· Breast cancer. °· Cervical cancer. °· Colorectal cancer. °· Skin cancer. °· Lung cancer. °What should I know about heart disease, diabetes,  and high blood pressure? °Blood pressure and heart disease °· High blood pressure causes heart disease and increases the risk of stroke. This is more likely to develop in people who have high blood pressure readings, are of African descent, or are overweight. °· Have your blood pressure checked: °? Every 3-5 years if you are 18-39 years of age. °? Every year if you are 40 years old or older. °Diabetes °Have regular diabetes screenings. This checks your fasting blood sugar level. Have the screening done: °· Once every three years after age 40 if you are at a normal weight and have a low risk for diabetes. °· More often and at a younger age if you are overweight or have a high risk for diabetes. °What should I know about preventing infection? °Hepatitis B °If you have a higher risk for hepatitis B, you should be screened for this virus. Talk with your health care provider to find out if you are at risk for hepatitis B infection. °Hepatitis C °Testing is recommended for: °· Everyone born from 1945 through 1965. °· Anyone with known risk factors for hepatitis C. °Sexually transmitted infections (STIs) °· Get screened for STIs, including gonorrhea and chlamydia, if: °? You are sexually active and are younger than 44 years of age. °? You are older than 44 years of age and your health care provider tells you that you are at risk for this type of infection. °? Your sexual activity has changed since you were last screened, and you are at increased risk for chlamydia or gonorrhea.   Ask your health care provider if you are at risk. °· Ask your health care provider about whether you are at high risk for HIV. Your health care provider [place] recommend a prescription medicine to help prevent HIV infection. If you choose to take medicine to prevent HIV, you should first get tested for HIV. You should then be tested every 3 months for as long as you are taking the medicine. °Pregnancy °· If you are about to stop having your period  (premenopausal) and you [place] become pregnant, seek counseling before you get pregnant. °· Take 400 to 800 micrograms (mcg) of folic acid every day if you become pregnant. °· Ask for birth control (contraception) if you want to prevent pregnancy. °Osteoporosis and menopause °Osteoporosis is a disease in which the bones lose minerals and strength with aging. This can result in bone fractures. If you are 65 years old or older, or if you are at risk for osteoporosis and fractures, ask your health care provider if you should: °· Be screened for bone loss. °· Take a calcium or vitamin D supplement to lower your risk of fractures. °· Be given hormone replacement therapy (HRT) to treat symptoms of menopause. °Follow these instructions at home: °Lifestyle °· Do not use any products that contain nicotine or tobacco, such as cigarettes, e-cigarettes, and chewing tobacco. If you need help quitting, ask your health care provider. °· Do not use street drugs. °· Do not share needles. °· Ask your health care provider for help if you need support or information about quitting drugs. °Alcohol use °· Do not drink alcohol if: °? Your health care provider tells you not to drink. °? You are pregnant, [place] be pregnant, or are planning to become pregnant. °· If you drink alcohol: °? Limit how much you use to 0-1 drink a day. °? Limit intake if you are breastfeeding. °· Be aware of how much alcohol is in your drink. In the [place] one drink equals one 12 oz bottle of beer (355 mL), one 5 oz glass of wine (148 mL), or one 1½ oz glass of hard liquor (44 mL). °General instructions °· Schedule regular health, dental, and eye exams. °· Stay current with your vaccines. °· Tell your health care provider if: °? You often feel depressed. °? You have ever been abused or do not feel safe at home. °Summary °· Adopting a healthy lifestyle and getting preventive care are important in promoting health and wellness. °· Follow your health care provider's  instructions about healthy diet, exercising, and getting tested or screened for diseases. °· Follow your health care provider's instructions on monitoring your cholesterol and blood pressure. °This information is not intended to replace advice given to you by your health care provider. Make sure you discuss any questions you have with your health care provider. °Document Revised: 11/14/2018 Document Reviewed: 11/14/2018 °Elsevier Patient Education © 2020 [employer] ° °

## 2020-02-04 NOTE — Assessment & Plan Note (Signed)
No alarm symptoms. Patient will look into insurance and let me know about dexilant if she would like to make that change. Advised EGD with colonoscopy in the future.

## 2020-02-04 NOTE — Progress Notes (Signed)
Subjective:    Patient ID: Jaime Tate, female    DOB: 1976/06/03, 44 y.o.   MRN: [id number]  CC: Jaime Tate is a 44 y.o. female who presents today for physical exam.    HPI: Doing well today, no complaints   Gerd- Controlled. on protonix however  if doesn't take an hour before meal will have regurgitation throughout the day.  When on dexilant in the past, didn't have this problem. Would like to trial dexilant again if covered by insurance. No trouble swallowing, pain with swallowing. Has had EGD 6 years ago ( unable to see this) .  Depression- doing well on lexapro. No si/hi     Colorectal Cancer Screening: no early family history.  Breast Cancer Screening: Mammogram UTD Cervical Cancer Screening: UTD. No pelvic pain.       Tetanus - utd         Labs: Screening labs today. Exercise: Gets regular exercise.  Alcohol use: Occassional Smoking/tobacco use: Nonsmoker.  Family history of melanoma,BCC. Has seen dermatology in the past. Will make a follow up for this  HISTORY:  Past Medical History:  Diagnosis Date  . Allergy    Seasonal  . Anxiety   . Chicken pox   . GERD (gastroesophageal reflux disease)     History reviewed. No pertinent surgical history. Family History  Problem Relation Age of Onset  . Arthritis Mother   . Osteoporosis Mother   . Heart disease Father   . Hypertension Father   . Diabetes Father   . Basal cell carcinoma Father   . Cancer Maternal Grandmother 44       Breast  . Breast cancer Maternal Grandmother 63  . Cancer Paternal Grandmother 64       Colon  . Lung cancer Paternal Grandfather   . Melanoma Paternal Grandfather   . Ovarian cancer Neg Hx       ALLERGIES: Biaxin [clarithromycin]  Current Outpatient Medications on File Prior to Visit  Medication Sig Dispense Refill  . Acetaminophen (TYLENOL PO) Take by mouth.    . Cholecalciferol (VITAMIN D3) 50 MCG (2000 UT) capsule Take 2,000 Units by mouth daily.    . Coenzyme  Q10-Fish Oil-Vit E (CO-Q 10 OMEGA-3 FISH OIL PO) Take 1 capsule by mouth daily.    Marland Kitchen escitalopram (LEXAPRO) 10 MG tablet TAKE 1 TABLET BY MOUTH EVERY DAY 90 tablet 2  . fexofenadine (ALLEGRA) 60 MG tablet Take 60 mg by mouth daily.    . Ibuprofen (ADVIL PO) Take by mouth.    . pantoprazole (PROTONIX) 40 MG tablet TAKE 1 TABLET BY MOUTH EVERY DAY 90 tablet 0   No current facility-administered medications on file prior to visit.    Social History   Tobacco Use  . Smoking status: Never Smoker  . Smokeless tobacco: Never Used  Substance Use Topics  . Alcohol use: Yes    Alcohol/week: 0.0 standard drinks    Comment: 1 drink per week  . Drug use: No    Review of Systems  Constitutional: Negative for chills, fever and unexpected weight change.  HENT: Negative for congestion.   Respiratory: Negative for cough.   Cardiovascular: Negative for chest pain, palpitations and leg swelling.  Gastrointestinal: Negative for nausea and vomiting.  Musculoskeletal: Negative for arthralgias and myalgias.  Skin: Negative for rash.  Neurological: Negative for headaches.  Hematological: Negative for adenopathy.  Psychiatric/Behavioral: Negative for confusion.      Objective:    BP 112/72  Pulse 94   Temp 98.1 F (36.7 C) (Temporal)   Ht 5\' 4"  (1.626 m)   Wt 226 lb 9.6 oz (102.8 kg)   SpO2 98%   BMI 38.90 kg/m   BP Readings from Last 3 Encounters:  02/04/20 112/72  01/14/19 118/78  02/14/18 120/82   Wt Readings from Last 3 Encounters:  02/04/20 226 lb 9.6 oz (102.8 kg)  01/14/19 221 lb (100.2 kg)  02/14/18 215 lb 6.4 oz (97.7 kg)    Physical Exam Vitals reviewed.  Constitutional:      Appearance: She is well-developed.  Eyes:     Conjunctiva/sclera: Conjunctivae normal.  Neck:     Thyroid: No thyroid mass or thyromegaly.  Cardiovascular:     Rate and Rhythm: Normal rate and regular rhythm.     Pulses: Normal pulses.     Heart sounds: Normal heart sounds.  Pulmonary:      Effort: Pulmonary effort is normal.     Breath sounds: Normal breath sounds. No wheezing, rhonchi or rales.  Chest:     Breasts: Breasts are symmetrical.        Right: No inverted nipple, mass, nipple discharge, skin change or tenderness.        Left: No inverted nipple, mass, nipple discharge, skin change or tenderness.  Lymphadenopathy:     Head:     Right side of head: No submental, submandibular, tonsillar, preauricular, posterior auricular or occipital adenopathy.     Left side of head: No submental, submandibular, tonsillar, preauricular, posterior auricular or occipital adenopathy.     Cervical: No cervical adenopathy.     Right cervical: No superficial, deep or posterior cervical adenopathy.    Left cervical: No superficial, deep or posterior cervical adenopathy.  Skin:    General: Skin is warm and dry.  Neurological:     Mental Status: She is alert.  Psychiatric:        Speech: Speech normal.        Behavior: Behavior normal.        Thought Content: Thought content normal.        Assessment & Plan:   Problem List Items Addressed This Visit      Digestive   GERD (gastroesophageal reflux disease)    No alarm symptoms. Patient will look into insurance and let me know about dexilant if she would like to make that change. Advised EGD with colonoscopy in the future.         Other   Generalized anxiety disorder    Stable on Lexapro, will continue      Routine general medical examination at a health care facility - Primary    Clinical breast exam performed.  Deferred pelvic exam in the absence of complaints and Pap smear is up-to-date.  Patient will make follow-up with dermatology for annual skin exam.      Relevant Orders   TSH   CBC with Differential/Platelet   Comprehensive metabolic panel   Hemoglobin A1c   Lipid panel   VITAMIN D 25 Hydroxy (Vit-D Deficiency, Fractures)       I have discontinued Jaime Tate's cetirizine. I am also having her maintain  her Ibuprofen (ADVIL PO), Acetaminophen (TYLENOL PO), escitalopram, pantoprazole, fexofenadine, Vitamin D3, and Coenzyme Q10-Fish Oil-Vit E (CO-Q 10 OMEGA-3 FISH OIL PO).   No orders of the defined types were placed in this encounter.   Return precautions given.   Risks, benefits, and alternatives of the medications and treatment plan prescribed today were  discussed, and patient expressed understanding.   Education regarding symptom management and diagnosis given to patient on AVS.   Continue to follow with Jaime Hawthorne, FNP for routine health maintenance.   Jaime Tate and I agreed with plan.   Mable Paris, FNP

## 2020-02-04 NOTE — Assessment & Plan Note (Signed)
Clinical breast exam performed.  Deferred pelvic exam in the absence of complaints and Pap smear is up-to-date.  Patient will make follow-up with dermatology for annual skin exam.

## 2020-02-04 NOTE — Assessment & Plan Note (Signed)
Stable on Lexapro, will continue

## 2020-02-06 ENCOUNTER — Encounter: Payer: Self-pay | Admitting: Family

## 2020-02-10 ENCOUNTER — Other Ambulatory Visit: Payer: Self-pay | Admitting: Family

## 2020-02-10 DIAGNOSIS — K2100 Gastro-esophageal reflux disease with esophagitis, without bleeding: Secondary | ICD-10-CM

## 2020-02-10 DIAGNOSIS — R718 Other abnormality of red blood cells: Secondary | ICD-10-CM

## 2020-02-10 MED ORDER — DEXILANT 30 MG PO CPDR: 30.0000 mg | DELAYED_RELEASE_CAPSULE | Freq: Every day | ORAL | 2 refills | Status: DC

## 2020-03-29 ENCOUNTER — Other Ambulatory Visit: Payer: Self-pay | Admitting: Family

## 2020-06-28 ENCOUNTER — Other Ambulatory Visit: Payer: Self-pay | Admitting: Internal Medicine

## 2020-07-03 ENCOUNTER — Other Ambulatory Visit: Payer: Self-pay | Admitting: Family

## 2020-07-03 DIAGNOSIS — Z1231 Encounter for screening mammogram for malignant neoplasm of breast: Secondary | ICD-10-CM

## 2020-07-23 ENCOUNTER — Ambulatory Visit
Admission: RE | Admit: 2020-07-23 | Discharge: 2020-07-23 | Disposition: A | Discharge status: Home / Self Care | Payer: BC Managed Care – PPO | Source: Ambulatory Visit | Attending: Family | Admitting: Family

## 2020-07-23 ENCOUNTER — Other Ambulatory Visit: Payer: Self-pay

## 2020-07-23 DIAGNOSIS — Z1231 Encounter for screening mammogram for malignant neoplasm of breast: Secondary | ICD-10-CM | POA: Insufficient documentation

## 2020-11-01 ENCOUNTER — Other Ambulatory Visit: Payer: Self-pay | Admitting: Family

## 2020-11-01 DIAGNOSIS — K2100 Gastro-esophageal reflux disease with esophagitis, without bleeding: Secondary | ICD-10-CM

## 2021-01-25 ENCOUNTER — Encounter: Payer: Self-pay | Admitting: Family

## 2021-02-05 ENCOUNTER — Encounter: Payer: BC Managed Care – PPO | Admitting: Family

## 2021-02-26 ENCOUNTER — Other Ambulatory Visit: Payer: Self-pay

## 2021-02-26 ENCOUNTER — Encounter: Payer: Self-pay | Admitting: Family

## 2021-02-26 ENCOUNTER — Other Ambulatory Visit (HOSPITAL_COMMUNITY)
Admission: RE | Admit: 2021-02-26 | Discharge: 2021-02-26 | Disposition: A | Discharge status: Home / Self Care | Payer: BC Managed Care – PPO | Source: Ambulatory Visit | Attending: Family | Admitting: Family

## 2021-02-26 ENCOUNTER — Ambulatory Visit (INDEPENDENT_AMBULATORY_CARE_PROVIDER_SITE_OTHER): Payer: BC Managed Care – PPO | Admitting: Family

## 2021-02-26 VITALS — BP 110/68 | HR 76 | Temp 98.1°F | Ht 64.02 in | Wt 231.2 lb

## 2021-02-26 DIAGNOSIS — K2100 Gastro-esophageal reflux disease with esophagitis, without bleeding: Secondary | ICD-10-CM | POA: Diagnosis not present

## 2021-02-26 DIAGNOSIS — N912 Amenorrhea, unspecified: Secondary | ICD-10-CM | POA: Insufficient documentation

## 2021-02-26 DIAGNOSIS — Z0000 Encounter for general adult medical examination without abnormal findings: Secondary | ICD-10-CM | POA: Diagnosis not present

## 2021-02-26 DIAGNOSIS — F411 Generalized anxiety disorder: Secondary | ICD-10-CM | POA: Diagnosis not present

## 2021-02-26 NOTE — Progress Notes (Signed)
Subjective:    Patient ID: Jaime Tate, female    DOB: January 12, 1976, 45 y.o.   MRN: [id number]  CC: Jaime Tate is a 45 y.o. female who presents today for physical exam and follow up.    HPI: GERD- compliant with dexilant 30mg  without breakthrough symptoms. No pain or trouble swallowing.   GAD- compliant with lexapro 10mg . Feels well on current dose.   She complains of missed menses this month. Last menses 01/23/21 and reports shorter and lighter in flow. No blood clots, pelvic pain.  Husband had vasectomy. She took a home pregnancy test which was negative. Currently no hot flashes though several months ago reports she had hot flashes occasionally. Reports mother went through menopause at 65 years of age. Grandmother went through menopause at 51 years of age.   Dermatology- she had followed with [facility] in the past.   Colorectal Cancer Screening: Due at 45 years of age , 11/2021 Breast Cancer Screening: Mammogram UTD Cervical Cancer Screening: due Bone Health screening/DEXA for 65+: No increased fracture risk. Defer screening at this time.          Tetanus - UTD        Hepatitis C screening - Candidate for, consents  Labs: Screening labs today. Exercise: Gets regular exercise with walking, hiking.  Alcohol use:  1 drink per week Smoking/tobacco use: Nonsmoker.     HISTORY:  Past Medical History:  Diagnosis Date  . Allergy    Seasonal  . Anxiety   . Chicken pox   . GERD (gastroesophageal reflux disease)     History reviewed. No pertinent surgical history. Family History  Problem Relation Age of Onset  . Arthritis Mother   . Osteoporosis Mother   . Heart disease Father   . Hypertension Father   . Diabetes Father   . Basal cell carcinoma Father   . Cancer Maternal Grandmother 23       Breast  . Breast cancer Maternal Grandmother 31  . Cancer Paternal Grandmother 35       Colon  . Lung cancer Paternal Grandfather   . Melanoma Paternal Grandfather   .  Ovarian cancer Neg Hx       ALLERGIES: Biaxin [clarithromycin]  Current Outpatient Medications on File Prior to Visit  Medication Sig Dispense Refill  . Acetaminophen (TYLENOL PO) Take by mouth.    . Cholecalciferol (VITAMIN D3) 50 MCG (2000 UT) capsule Take 2,000 Units by mouth daily.    . Coenzyme Q10-Fish Oil-Vit E (CO-Q 10 OMEGA-3 FISH OIL PO) Take 1 capsule by mouth daily.    Marland Kitchen DEXILANT 30 MG capsule TAKE 1 CAPSULE BY MOUTH EVERY DAY 90 capsule 2  . escitalopram (LEXAPRO) 10 MG tablet TAKE 1 TABLET BY MOUTH EVERY DAY 90 tablet 2  . fexofenadine (ALLEGRA) 60 MG tablet Take 60 mg by mouth daily.    . Ibuprofen (ADVIL PO) Take by mouth.     No current facility-administered medications on file prior to visit.    Social History   Tobacco Use  . Smoking status: Never Smoker  . Smokeless tobacco: Never Used  Substance Use Topics  . Alcohol use: Yes    Alcohol/week: 0.0 standard drinks    Comment: 1 drink per week  . Drug use: No    Review of Systems  Constitutional: Negative for chills, fever and unexpected weight change.  HENT: Negative for congestion.   Respiratory: Negative for cough.   Cardiovascular: Negative for chest pain, palpitations  and leg swelling.  Gastrointestinal: Negative for nausea and vomiting.  Genitourinary: Negative for difficulty urinating, vaginal bleeding, vaginal discharge and vaginal pain.  Musculoskeletal: Negative for arthralgias and myalgias.  Skin: Negative for rash.  Neurological: Negative for headaches.  Hematological: Negative for adenopathy.  Psychiatric/Behavioral: Negative for confusion.      Objective:    BP 110/68   Pulse 76   Temp 98.1 F (36.7 C)   Ht 5' 4.02" (1.626 m)   Wt 231 lb 3.2 oz (104.9 kg)   SpO2 97%   BMI 39.67 kg/m   BP Readings from Last 3 Encounters:  02/26/21 110/68  02/04/20 112/72  01/14/19 118/78   Wt Readings from Last 3 Encounters:  02/26/21 231 lb 3.2 oz (104.9 kg)  02/04/20 226 lb 9.6 oz (102.8  kg)  01/14/19 221 lb (100.2 kg)    Physical Exam Vitals reviewed.  Constitutional:      Appearance: She is well-developed.  Eyes:     Conjunctiva/sclera: Conjunctivae normal.  Neck:     Thyroid: No thyroid mass or thyromegaly.  Cardiovascular:     Rate and Rhythm: Normal rate and regular rhythm.     Pulses: Normal pulses.     Heart sounds: Normal heart sounds.  Pulmonary:     Effort: Pulmonary effort is normal.     Breath sounds: Normal breath sounds. No wheezing, rhonchi or rales.  Chest:  Breasts: Breasts are symmetrical.     Right: No inverted nipple, mass, nipple discharge, skin change or tenderness.     Left: No inverted nipple, mass, nipple discharge, skin change or tenderness.    Genitourinary:    Cervix: No cervical motion tenderness, discharge or friability.     Uterus: Not enlarged, not fixed and not tender.      Adnexa:        Right: No mass, tenderness or fullness.         Left: No mass, tenderness or fullness.       Comments: Pap performed. No CMT. Unable to appreciated ovaries. Lymphadenopathy:     Head:     Right side of head: No submental, submandibular, tonsillar, preauricular, posterior auricular or occipital adenopathy.     Left side of head: No submental, submandibular, tonsillar, preauricular, posterior auricular or occipital adenopathy.     Cervical:     Right cervical: No superficial, deep or posterior cervical adenopathy.    Left cervical: No superficial, deep or posterior cervical adenopathy.     Upper Body:     Right upper body: No pectoral adenopathy.     Left upper body: No pectoral adenopathy.  Skin:    General: Skin is warm and dry.  Neurological:     Mental Status: She is alert.  Psychiatric:        Speech: Speech normal.        Behavior: Behavior normal.        Thought Content: Thought content normal.        Assessment & Plan:   Problem List Items Addressed This Visit      Digestive   GERD (gastroesophageal reflux disease)     Controlled. Continue dexilant 30mg  qd        Other   Amenorrhea    Missed one period. Discuss various reasons for this including stress. Advised she is not typical age for menopause. No alarm features at this time. Pap collected today. Pending hormonal evaluation. Pending labs.       Relevant Orders  TSH   hCG, serum, qualitative   FSH   Prolactin   Generalized anxiety disorder    Controlled. Continue lexapro 10mg       Routine general medical examination at a health care facility - Primary    CBE and pap performed. Patient will call and schedule follow up with Dermatology and declines referral from me today. Encouraged to increase exercise.       Relevant Orders   Cytology - PAP   TSH   CBC with Differential/Platelet   Comprehensive metabolic panel   Hemoglobin A1c   Hepatitis C antibody   Lipid panel   VITAMIN D 25 Hydroxy (Vit-D Deficiency, Fractures)   hCG, serum, qualitative   FSH   Prolactin       I am having Jaime Tate maintain her Ibuprofen (ADVIL PO), Acetaminophen (TYLENOL PO), fexofenadine, Vitamin D3, Coenzyme Q10-Fish Oil-Vit E (CO-Q 10 OMEGA-3 FISH OIL PO), escitalopram, and Dexilant.   No orders of the defined types were placed in this encounter.   Return precautions given.   Risks, benefits, and alternatives of the medications and treatment plan prescribed today were discussed, and patient expressed understanding.   Education regarding symptom management and diagnosis given to patient on AVS.   Continue to follow with Jaime Hawthorne, FNP for routine health maintenance.   Abbott Pao Zimmermann and I agreed with plan.   Mable Paris, FNP

## 2021-02-26 NOTE — Assessment & Plan Note (Signed)
Controlled. Continue lexapro 10mg 

## 2021-02-26 NOTE — Patient Instructions (Signed)
Nice to see you!   Health Maintenance, Female Adopting a healthy lifestyle and getting preventive care are important in promoting health and wellness. Ask your health care provider about:  The right schedule for you to have regular tests and exams.  Things you can do on your own to prevent diseases and keep yourself healthy. What should I know about diet, weight, and exercise? Eat a healthy diet  Eat a diet that includes plenty of vegetables, fruits, low-fat dairy products, and lean protein.  Do not eat a lot of foods that are high in solid fats, added sugars, or sodium.   Maintain a healthy weight Body mass index (BMI) is used to identify weight problems. It estimates body fat based on height and weight. Your health care provider can help determine your BMI and help you achieve or maintain a healthy weight. Get regular exercise Get regular exercise. This is one of the most important things you can do for your health. Most adults should:  Exercise for at least 150 minutes each week. The exercise should increase your heart rate and make you sweat (moderate-intensity exercise).  Do strengthening exercises at least twice a week. This is in addition to the moderate-intensity exercise.  Spend less time sitting. Even light physical activity can be beneficial. Watch cholesterol and blood lipids Have your blood tested for lipids and cholesterol at 45 years of age, then have this test every 5 years. Have your cholesterol levels checked more often if:  Your lipid or cholesterol levels are high.  You are older than 45 years of age.  You are at high risk for heart disease. What should I know about cancer screening? Depending on your health history and family history, you Ruz need to have cancer screening at various ages. This Ladley include screening for:  Breast cancer.  Cervical cancer.  Colorectal cancer.  Skin cancer.  Lung cancer. What should I know about heart disease, diabetes,  and high blood pressure? Blood pressure and heart disease  High blood pressure causes heart disease and increases the risk of stroke. This is more likely to develop in people who have high blood pressure readings, are of African descent, or are overweight.  Have your blood pressure checked: ? Every 3-5 years if you are 33-28 years of age. ? Every year if you are 57 years old or older. Diabetes Have regular diabetes screenings. This checks your fasting blood sugar level. Have the screening done:  Once every three years after age 9 if you are at a normal weight and have a low risk for diabetes.  More often and at a younger age if you are overweight or have a high risk for diabetes. What should I know about preventing infection? Hepatitis B If you have a higher risk for hepatitis B, you should be screened for this virus. Talk with your health care provider to find out if you are at risk for hepatitis B infection. Hepatitis C Testing is recommended for:  Everyone born from [birth year] through 1965.  Anyone with known risk factors for hepatitis C. Sexually transmitted infections (STIs)  Get screened for STIs, including gonorrhea and chlamydia, if: ? You are sexually active and are younger than 45 years of age. ? You are older than 45 years of age and your health care provider tells you that you are at risk for this type of infection. ? Your sexual activity has changed since you were last screened, and you are at increased risk for chlamydia or  gonorrhea. Ask your health care provider if you are at risk.  Ask your health care provider about whether you are at high risk for HIV. Your health care provider Gerst recommend a prescription medicine to help prevent HIV infection. If you choose to take medicine to prevent HIV, you should first get tested for HIV. You should then be tested every 3 months for as long as you are taking the medicine. Pregnancy  If you are about to stop having your period  (premenopausal) and you Capley become pregnant, seek counseling before you get pregnant.  Take 400 to 800 micrograms (mcg) of folic acid every day if you become pregnant.  Ask for birth control (contraception) if you want to prevent pregnancy. Osteoporosis and menopause Osteoporosis is a disease in which the bones lose minerals and strength with aging. This can result in bone fractures. If you are 11 years old or older, or if you are at risk for osteoporosis and fractures, ask your health care provider if you should:  Be screened for bone loss.  Take a calcium or vitamin D supplement to lower your risk of fractures.  Be given hormone replacement therapy (HRT) to treat symptoms of menopause. Follow these instructions at home: Lifestyle  Do not use any products that contain nicotine or tobacco, such as cigarettes, e-cigarettes, and chewing tobacco. If you need help quitting, ask your health care provider.  Do not use street drugs.  Do not share needles.  Ask your health care provider for help if you need support or information about quitting drugs. Alcohol use  Do not drink alcohol if: ? Your health care provider tells you not to drink. ? You are pregnant, Costabile be pregnant, or are planning to become pregnant.  If you drink alcohol: ? Limit how much you use to 0-1 drink a day. ? Limit intake if you are breastfeeding.  Be aware of how much alcohol is in your drink. In the [place] one drink equals one 12 oz bottle of beer (355 mL), one 5 oz glass of wine (148 mL), or one 1 oz glass of hard liquor (44 mL). General instructions  Schedule regular health, dental, and eye exams.  Stay current with your vaccines.  Tell your health care provider if: ? You often feel depressed. ? You have ever been abused or do not feel safe at home. Summary  Adopting a healthy lifestyle and getting preventive care are important in promoting health and wellness.  Follow your health care provider's  instructions about healthy diet, exercising, and getting tested or screened for diseases.  Follow your health care provider's instructions on monitoring your cholesterol and blood pressure. This information is not intended to replace advice given to you by your health care provider. Make sure you discuss any questions you have with your health care provider. Document Revised: 11/14/2018 Document Reviewed: 11/14/2018 Elsevier Patient Education  2021 [employer].

## 2021-02-26 NOTE — Assessment & Plan Note (Signed)
Missed one period. Discuss various reasons for this including stress. Advised she is not typical age for menopause. No alarm features at this time. Pap collected today. Pending hormonal evaluation. Pending labs.

## 2021-02-26 NOTE — Assessment & Plan Note (Addendum)
CBE and pap performed. Patient will call and schedule follow up with Dermatology and declines referral from me today. Encouraged to increase exercise.

## 2021-02-26 NOTE — Assessment & Plan Note (Signed)
Controlled. Continue dexilant 30mg  qd

## 2021-03-01 LAB — CBC WITH DIFFERENTIAL/PLATELET
Absolute Monocytes: 707 cells/uL (ref 200–950)
Basophils Absolute: 82 cells/uL (ref 0–200)
Basophils Relative: 0.6 %
Eosinophils Absolute: 177 cells/uL (ref 15–500)
Eosinophils Relative: 1.3 %
HCT: 38.8 % (ref 35.0–45.0)
Hemoglobin: 12.2 g/dL (ref 11.7–15.5)
Lymphs Abs: 5508 cells/uL — ABNORMAL HIGH (ref 850–3900)
MCH: 22.9 pg — ABNORMAL LOW (ref 27.0–33.0)
MCHC: 31.4 g/dL — ABNORMAL LOW (ref 32.0–36.0)
MCV: 72.8 fL — ABNORMAL LOW (ref 80.0–100.0)
MPV: 9.9 fL (ref 7.5–12.5)
Monocytes Relative: 5.2 %
Neutro Abs: 7126 cells/uL (ref 1500–7800)
Neutrophils Relative %: 52.4 %
Platelets: 446 10*3/uL — ABNORMAL HIGH (ref 140–400)
RBC: 5.33 10*6/uL — ABNORMAL HIGH (ref 3.80–5.10)
RDW: 15.7 % — ABNORMAL HIGH (ref 11.0–15.0)
Total Lymphocyte: 40.5 %
WBC: 13.6 10*3/uL — ABNORMAL HIGH (ref 3.8–10.8)

## 2021-03-01 LAB — PROLACTIN: Prolactin: 7.9 ng/mL

## 2021-03-01 LAB — HEMOGLOBIN A1C
Hgb A1c MFr Bld: 6.3 % of total Hgb — ABNORMAL HIGH (ref <5.7)
Mean Plasma Glucose: 134 mg/dL
eAG (mmol/L): 7.4 mmol/L

## 2021-03-01 LAB — LIPID PANEL
Cholesterol: 188 mg/dL (ref <200)
HDL: 54 mg/dL (ref >=50)
LDL Cholesterol (Calc): 111 mg/dL (calc) — ABNORMAL HIGH
Non-HDL Cholesterol (Calc): 134 mg/dL (calc) — ABNORMAL HIGH (ref <130)
Total CHOL/HDL Ratio: 3.5 (calc) (ref <5.0)
Triglycerides: 115 mg/dL (ref <150)

## 2021-03-01 LAB — COMPREHENSIVE METABOLIC PANEL
AG Ratio: 1.3 (calc) (ref 1.0–2.5)
ALT: 43 U/L — ABNORMAL HIGH (ref 6–29)
AST: 34 U/L — ABNORMAL HIGH (ref 10–30)
Albumin: 4 g/dL (ref 3.6–5.1)
Alkaline phosphatase (APISO): 107 U/L (ref 31–125)
BUN: 10 mg/dL (ref 7–25)
CO2: 19 mmol/L — ABNORMAL LOW (ref 20–32)
Calcium: 9.2 mg/dL (ref 8.6–10.2)
Chloride: 103 mmol/L (ref 98–110)
Creat: 0.74 mg/dL (ref 0.50–1.10)
Globulin: 3 g/dL (calc) (ref 1.9–3.7)
Glucose, Bld: 82 mg/dL (ref 65–99)
Potassium: 4 mmol/L (ref 3.5–5.3)
Sodium: 139 mmol/L (ref 135–146)
Total Bilirubin: 0.3 mg/dL (ref 0.2–1.2)
Total Protein: 7 g/dL (ref 6.1–8.1)

## 2021-03-01 LAB — HCG, SERUM, QUALITATIVE: Preg, Serum: NEGATIVE

## 2021-03-01 LAB — HEPATITIS C ANTIBODY
Hepatitis C Ab: NONREACTIVE
SIGNAL TO CUT-OFF: 0 (ref <1.00)

## 2021-03-01 LAB — VITAMIN D 25 HYDROXY (VIT D DEFICIENCY, FRACTURES): Vit D, 25-Hydroxy: 44 ng/mL (ref 30–100)

## 2021-03-01 LAB — FOLLICLE STIMULATING HORMONE: FSH: 4.8 m[IU]/mL

## 2021-03-01 LAB — TSH: TSH: 1.58 mIU/L

## 2021-03-02 LAB — CYTOLOGY - PAP
Comment: NEGATIVE
Diagnosis: NEGATIVE
High risk HPV: NEGATIVE

## 2021-03-03 ENCOUNTER — Other Ambulatory Visit: Payer: Self-pay | Admitting: Family

## 2021-03-03 DIAGNOSIS — Z0000 Encounter for general adult medical examination without abnormal findings: Secondary | ICD-10-CM

## 2021-03-03 MED ORDER — FLUCONAZOLE 150 MG PO TABS: 150.0000 mg | ORAL_TABLET | Freq: Once | ORAL | 1 refills | Status: AC

## 2021-03-12 ENCOUNTER — Other Ambulatory Visit (INDEPENDENT_AMBULATORY_CARE_PROVIDER_SITE_OTHER): Payer: BC Managed Care – PPO

## 2021-03-12 ENCOUNTER — Other Ambulatory Visit: Payer: Self-pay

## 2021-03-12 DIAGNOSIS — Z0000 Encounter for general adult medical examination without abnormal findings: Secondary | ICD-10-CM

## 2021-03-12 LAB — CBC WITH DIFFERENTIAL/PLATELET
Basophils Absolute: 0.1 10*3/uL (ref 0.0–0.1)
Basophils Relative: 1.1 % (ref 0.0–3.0)
Eosinophils Absolute: 0.2 10*3/uL (ref 0.0–0.7)
Eosinophils Relative: 1.8 % (ref 0.0–5.0)
HCT: 37.2 % (ref 36.0–46.0)
Hemoglobin: 11.7 g/dL — ABNORMAL LOW (ref 12.0–15.0)
Lymphocytes Relative: 39 % (ref 12.0–46.0)
Lymphs Abs: 4.6 10*3/uL — ABNORMAL HIGH (ref 0.7–4.0)
MCHC: 31.4 g/dL (ref 30.0–36.0)
MCV: 72.7 fl — ABNORMAL LOW (ref 78.0–100.0)
Monocytes Absolute: 0.6 10*3/uL (ref 0.1–1.0)
Monocytes Relative: 4.8 % (ref 3.0–12.0)
Neutro Abs: 6.3 10*3/uL (ref 1.4–7.7)
Neutrophils Relative %: 53.3 % (ref 43.0–77.0)
Platelets: 447 10*3/uL — ABNORMAL HIGH (ref 150.0–400.0)
RBC: 5.12 Mil/uL — ABNORMAL HIGH (ref 3.87–5.11)
RDW: 17.2 % — ABNORMAL HIGH (ref 11.5–15.5)
WBC: 11.8 10*3/uL — ABNORMAL HIGH (ref 4.0–10.5)

## 2021-03-12 LAB — COMPREHENSIVE METABOLIC PANEL
ALT: 16 U/L (ref 0–35)
AST: 14 U/L (ref 0–37)
Albumin: 4.1 g/dL (ref 3.5–5.2)
Alkaline Phosphatase: 105 U/L (ref 39–117)
BUN: 10 mg/dL (ref 6–23)
CO2: 26 mEq/L (ref 19–32)
Calcium: 9.1 mg/dL (ref 8.4–10.5)
Chloride: 105 mEq/L (ref 96–112)
Creatinine, Ser: 0.72 mg/dL (ref 0.40–1.20)
GFR: 101.77 mL/min (ref >60.00)
Glucose, Bld: 113 mg/dL — ABNORMAL HIGH (ref 70–99)
Potassium: 3.9 mEq/L (ref 3.5–5.1)
Sodium: 138 mEq/L (ref 135–145)
Total Bilirubin: 0.3 mg/dL (ref 0.2–1.2)
Total Protein: 6.7 g/dL (ref 6.0–8.3)

## 2021-03-28 ENCOUNTER — Other Ambulatory Visit: Payer: Self-pay | Admitting: Internal Medicine

## 2021-04-08 ENCOUNTER — Telehealth: Payer: Self-pay | Admitting: *Deleted

## 2021-04-08 NOTE — Telephone Encounter (Signed)
Please place future orders for lab appt.  

## 2021-04-09 ENCOUNTER — Other Ambulatory Visit: Payer: Self-pay | Admitting: Family

## 2021-04-09 DIAGNOSIS — R899 Unspecified abnormal finding in specimens from other organs, systems and tissues: Secondary | ICD-10-CM

## 2021-04-15 ENCOUNTER — Other Ambulatory Visit (INDEPENDENT_AMBULATORY_CARE_PROVIDER_SITE_OTHER): Payer: BC Managed Care – PPO

## 2021-04-15 ENCOUNTER — Other Ambulatory Visit: Payer: Self-pay

## 2021-04-15 DIAGNOSIS — R899 Unspecified abnormal finding in specimens from other organs, systems and tissues: Secondary | ICD-10-CM

## 2021-04-15 LAB — CBC WITH DIFFERENTIAL/PLATELET
Basophils Absolute: 0.1 10*3/uL (ref 0.0–0.1)
Basophils Relative: 0.5 % (ref 0.0–3.0)
Eosinophils Absolute: 0.2 10*3/uL (ref 0.0–0.7)
Eosinophils Relative: 1.4 % (ref 0.0–5.0)
HCT: 36.3 % (ref 36.0–46.0)
Hemoglobin: 11.5 g/dL — ABNORMAL LOW (ref 12.0–15.0)
Lymphocytes Relative: 40.1 % (ref 12.0–46.0)
Lymphs Abs: 4.7 10*3/uL — ABNORMAL HIGH (ref 0.7–4.0)
MCHC: 31.6 g/dL (ref 30.0–36.0)
MCV: 72.1 fl — ABNORMAL LOW (ref 78.0–100.0)
Monocytes Absolute: 0.6 10*3/uL (ref 0.1–1.0)
Monocytes Relative: 5.3 % (ref 3.0–12.0)
Neutro Abs: 6.2 10*3/uL (ref 1.4–7.7)
Neutrophils Relative %: 52.7 % (ref 43.0–77.0)
Platelets: 443 10*3/uL — ABNORMAL HIGH (ref 150.0–400.0)
RBC: 5.03 Mil/uL (ref 3.87–5.11)
RDW: 16.8 % — ABNORMAL HIGH (ref 11.5–15.5)
WBC: 11.8 10*3/uL — ABNORMAL HIGH (ref 4.0–10.5)

## 2021-04-20 ENCOUNTER — Other Ambulatory Visit: Payer: Self-pay | Admitting: Family

## 2021-04-20 ENCOUNTER — Encounter: Payer: Self-pay | Admitting: Family

## 2021-04-20 DIAGNOSIS — D649 Anemia, unspecified: Secondary | ICD-10-CM

## 2021-04-21 ENCOUNTER — Other Ambulatory Visit (INDEPENDENT_AMBULATORY_CARE_PROVIDER_SITE_OTHER): Payer: BC Managed Care – PPO

## 2021-04-21 ENCOUNTER — Other Ambulatory Visit: Payer: Self-pay

## 2021-04-21 DIAGNOSIS — D649 Anemia, unspecified: Secondary | ICD-10-CM

## 2021-04-21 LAB — URINALYSIS, MICROSCOPIC ONLY

## 2021-04-22 LAB — IBC + FERRITIN
Ferritin: 14.1 ng/mL (ref 10.0–291.0)
Iron: 32 ug/dL — ABNORMAL LOW (ref 42–145)
Saturation Ratios: 6.9 % — ABNORMAL LOW (ref 20.0–50.0)
Transferrin: 330 mg/dL (ref 212.0–360.0)

## 2021-04-23 ENCOUNTER — Other Ambulatory Visit: Payer: Self-pay | Admitting: Family

## 2021-04-23 DIAGNOSIS — D509 Iron deficiency anemia, unspecified: Secondary | ICD-10-CM

## 2021-04-23 DIAGNOSIS — D72829 Elevated white blood cell count, unspecified: Secondary | ICD-10-CM

## 2021-04-23 MED ORDER — FERROUS SULFATE 325 (65 FE) MG PO TBEC: 325.0000 mg | DELAYED_RELEASE_TABLET | Freq: Two times a day (BID) | ORAL | 2 refills | Status: DC

## 2021-04-26 ENCOUNTER — Other Ambulatory Visit: Payer: Self-pay | Admitting: Family

## 2021-04-27 ENCOUNTER — Encounter: Payer: Self-pay | Admitting: Internal Medicine

## 2021-04-27 ENCOUNTER — Inpatient Hospital Stay: Payer: BC Managed Care – PPO | Attending: Internal Medicine | Admitting: Internal Medicine

## 2021-04-27 ENCOUNTER — Inpatient Hospital Stay: Payer: BC Managed Care – PPO

## 2021-04-27 ENCOUNTER — Other Ambulatory Visit: Payer: Self-pay

## 2021-04-27 DIAGNOSIS — Z79899 Other long term (current) drug therapy: Secondary | ICD-10-CM | POA: Insufficient documentation

## 2021-04-27 DIAGNOSIS — D509 Iron deficiency anemia, unspecified: Secondary | ICD-10-CM | POA: Insufficient documentation

## 2021-04-27 DIAGNOSIS — D72820 Lymphocytosis (symptomatic): Secondary | ICD-10-CM

## 2021-04-27 LAB — CBC WITH DIFFERENTIAL/PLATELET
Abs Immature Granulocytes: 0.05 10*3/uL (ref 0.00–0.07)
Basophils Absolute: 0.1 10*3/uL (ref 0.0–0.1)
Basophils Relative: 1 %
Eosinophils Absolute: 0.2 10*3/uL (ref 0.0–0.5)
Eosinophils Relative: 2 %
HCT: 39.8 % (ref 36.0–46.0)
Hemoglobin: 12.4 g/dL (ref 12.0–15.0)
Immature Granulocytes: 0 %
Lymphocytes Relative: 34 %
Lymphs Abs: 4.1 10*3/uL — ABNORMAL HIGH (ref 0.7–4.0)
MCH: 23.1 pg — ABNORMAL LOW (ref 26.0–34.0)
MCHC: 31.2 g/dL (ref 30.0–36.0)
MCV: 74.3 fL — ABNORMAL LOW (ref 80.0–100.0)
Monocytes Absolute: 0.6 10*3/uL (ref 0.1–1.0)
Monocytes Relative: 5 %
Neutro Abs: 6.9 10*3/uL (ref 1.7–7.7)
Neutrophils Relative %: 58 %
Platelets: 420 10*3/uL — ABNORMAL HIGH (ref 150–400)
RBC: 5.36 MIL/uL — ABNORMAL HIGH (ref 3.87–5.11)
RDW: 17 % — ABNORMAL HIGH (ref 11.5–15.5)
WBC: 12 10*3/uL — ABNORMAL HIGH (ref 4.0–10.5)
nRBC: 0 % (ref 0.0–0.2)

## 2021-04-27 LAB — TECHNOLOGIST SMEAR REVIEW: Plt Morphology: INCREASED

## 2021-04-27 LAB — LACTATE DEHYDROGENASE: LDH: 135 U/L (ref 98–192)

## 2021-04-27 NOTE — Progress Notes (Signed)
[facility] CONSULT NOTE  Patient Care Team: Burnard Hawthorne, FNP as PCP - General (Family Medicine)  CHIEF COMPLAINTS/PURPOSE OF CONSULTATION: Leuccytosis  #Mild leukocytosis/lymphocytosis  #Mild microcytic anemia  #Mild thrombocytosis  Oncology History   No history exists.     HISTORY OF PRESENTING ILLNESS:  Jaime Tate 45 y.o.  female with no significant past medical history has been referred to [place] for mild cytosis/lymphocytosis.  Patient denies any frequent infections.  Denies any skin rash.  Denies any recent use of antibiotics.  She denies any heavy menstrual cycles except for 1 day during her menstruation.  Her menstrual cycles have been recently irregular because of possible COVID vaccination.  Denies any blood in stools or black or stools.  Recently-had urine test/stool test done with PCP.  Results pending.   Review of Systems  Constitutional: Negative for chills, diaphoresis, fever, malaise/fatigue and weight loss.  HENT: Negative for nosebleeds and sore throat.   Eyes: Negative for double vision.  Respiratory: Negative for cough, hemoptysis, sputum production, shortness of breath and wheezing.   Cardiovascular: Negative for chest pain, palpitations, orthopnea and leg swelling.  Gastrointestinal: Negative for abdominal pain, blood in stool, constipation, diarrhea, heartburn, melena, nausea and vomiting.  Genitourinary: Negative for dysuria, frequency and urgency.  Musculoskeletal: Negative for back pain and joint pain.  Skin: Negative.  Negative for itching and rash.  Neurological: Negative for dizziness, tingling, focal weakness, weakness and headaches.  Endo/Heme/Allergies: Does not bruise/bleed easily.  Psychiatric/Behavioral: Negative for depression. The patient is not nervous/anxious and does not have insomnia.      MEDICAL HISTORY:  Past Medical History:  Diagnosis Date  . Allergy    Seasonal  . Anxiety   . Chicken pox   . GERD  (gastroesophageal reflux disease)     SURGICAL HISTORY: No past surgical history on file.  SOCIAL HISTORY: Social History   Socioeconomic History  . Marital status: Married    Spouse name: Not on file  . Number of children: Not on file  . Years of education: Not on file  . Highest education level: Not on file  Occupational History  . Not on file  Tobacco Use  . Smoking status: Never Smoker  . Smokeless tobacco: Never Used  Substance and Sexual Activity  . Alcohol use: Yes    Alcohol/week: 0.0 standard drinks    Comment: 1 drink per week  . Drug use: No  . Sexual activity: Yes    Partners: Male    Birth control/protection: Surgical    Comment: Husband- Vasectomy  Other Topics Concern  . Not on file  Social History Narrative   Works for [employer]    Lives with husband and 1 son (23 yo in August)    Pets: Cat    Coffee- 1 cup coffee, 1 occasional unsweet tea, no soda    Highest level education- Masters degree   Enjoys read, Youth worker, and cross stitch    Social Determinants of Health   Financial Resource Strain: Not on file  Food Insecurity: Not on file  Transportation Needs: Not on file  Physical Activity: Not on file  Stress: Not on file  Social Connections: Not on file  Intimate Partner Violence: Not on file    FAMILY HISTORY: Family History  Problem Relation Age of Onset  . Arthritis Mother   . Osteoporosis Mother   . Heart disease Father   . Hypertension Father   . Diabetes Father   .  Basal cell carcinoma Father   . Cancer Maternal Grandmother 15       Breast  . Breast cancer Maternal Grandmother 35  . Cancer Paternal Grandmother 71       Colon  . Lung cancer Paternal Grandfather   . Melanoma Paternal Grandfather   . Ovarian cancer Neg Hx     ALLERGIES:  is allergic to biaxin [clarithromycin].  MEDICATIONS:  Current Outpatient Medications  Medication Sig Dispense Refill  . Acetaminophen (TYLENOL PO) Take by mouth.    .  Cholecalciferol (VITAMIN D3) 50 MCG (2000 UT) capsule Take 2,000 Units by mouth daily.    Marland Kitchen DEXILANT 30 MG capsule TAKE 1 CAPSULE BY MOUTH EVERY DAY 90 capsule 2  . escitalopram (LEXAPRO) 10 MG tablet TAKE 1 TABLET BY MOUTH EVERY DAY 90 tablet 2  . ferrous sulfate 325 (65 FE) MG EC tablet Take 1 tablet (325 mg total) by mouth 2 (two) times daily with a meal. 60 tablet 2  . fexofenadine (ALLEGRA) 60 MG tablet Take 60 mg by mouth daily.    . Ibuprofen (ADVIL PO) Take by mouth.    . Misc Natural Products (ELDERBERRY ZINC/VIT C/IMMUNE MT) Use as directed in the mouth or throat.    . Omega 3 1000 MG CAPS Take 2,000 mg by mouth daily.    . Probiotic Product (PROBIOTIC ADVANCED PO) Take by mouth.     No current facility-administered medications for this visit.      Marland Kitchen  PHYSICAL EXAMINATION: ECOG PERFORMANCE STATUS: 0 - Asymptomatic  Vitals:   04/27/21 1127  BP: (!) 144/88  Pulse: 87  Resp: 16  Temp: 98.4 F (36.9 C)  SpO2: 99%   Filed Weights   04/27/21 1127  Weight: 230 lb (104.3 kg)    Physical Exam HENT:     Head: Normocephalic and atraumatic.     Mouth/Throat:     Pharynx: No oropharyngeal exudate.  Eyes:     Pupils: Pupils are equal, round, and reactive to light.  Cardiovascular:     Rate and Rhythm: Normal rate and regular rhythm.  Pulmonary:     Effort: Pulmonary effort is normal. No respiratory distress.     Breath sounds: Normal breath sounds. No wheezing.  Abdominal:     General: Bowel sounds are normal. There is no distension.     Palpations: Abdomen is soft. There is no mass.     Tenderness: There is no abdominal tenderness. There is no guarding or rebound.  Musculoskeletal:        General: No tenderness. Normal range of motion.     Cervical back: Normal range of motion and neck supple.  Skin:    General: Skin is warm.  Neurological:     Mental Status: She is alert and oriented to person, place, and time.  Psychiatric:        Mood and Affect: Affect  normal.      LABORATORY DATA:  I have reviewed the data as listed Lab Results  Component Value Date   WBC 11.8 (H) 04/15/2021   HGB 11.5 (L) 04/15/2021   HCT 36.3 04/15/2021   MCV 72.1 (L) 04/15/2021   PLT 443.0 (H) 04/15/2021   Recent Labs    02/26/21 1421 03/12/21 0939  NA 139 138  K 4.0 3.9  CL 103 105  CO2 19* 26  GLUCOSE 82 113*  BUN 10 10  CREATININE 0.74 0.72  CALCIUM 9.2 9.1  PROT 7.0 6.7  ALBUMIN  --  4.1  AST 34* 14  ALT 43* 16  ALKPHOS  --  105  BILITOT 0.3 0.3    RADIOGRAPHIC STUDIES: I have personally reviewed the radiological images as listed and agreed with the findings in the report. No results found.  ASSESSMENT & PLAN:   Lymphocytosis #Mild leukocytosis/lymphocytosis [ALC-4000-5000]-likely reactive.  Rule out any underlying lymphoproliferative disorder/CLL.  Check CBC; LDH; review of peripheral smear; check peripheral blood flow cytometry.  # mild microcytic anemia-hemoglobin approximately 11; MCV 70s-suspect iron deficiency; ferritin 14; iron saturation 6.9%.  Recommend oral iron.  # Thrombocytosis [platelets 470]-again suspect reactive/iron deficiency; hold off any MPN work-up at this time.  Might consider MPN in future.  Thank you Ms.Arnett. for allowing me to participate in the care of your pleasant patient. Please do not hesitate to contact me with questions or concerns in the interim.  # DISPOSITION: # labs today # follow up in 1 week/Friday-virtual visit-Dr.B   All questions were answered. The patient knows to call the clinic with any problems, questions or concerns.   Cammie Sickle, MD 04/27/2021 12:05 PM

## 2021-04-27 NOTE — Assessment & Plan Note (Addendum)
#  Mild leukocytosis/lymphocytosis [ALC-4000-5000]-likely reactive.  Rule out any underlying lymphoproliferative disorder/CLL.  Check CBC; LDH; review of peripheral smear; check peripheral blood flow cytometry.  # mild microcytic anemia-hemoglobin approximately 11; MCV 70s-suspect iron deficiency; ferritin 14; iron saturation 6.9%.  Recommend oral iron.  # Thrombocytosis [platelets 470]-again suspect reactive/iron deficiency; hold off any MPN work-up at this time.  Might consider MPN in future.  Thank you Ms.Arnett. for allowing me to participate in the care of your pleasant patient. Please do not hesitate to contact me with questions or concerns in the interim.  # DISPOSITION: # labs today # follow up in 1 week/Friday-virtual visit-Dr.B

## 2021-04-27 NOTE — Progress Notes (Signed)
Has ringing in ears and pressure. Has had more headaches than normal. Sometimes feels like she has blood rushing to her ears. Has had ears checked but nothing was seen.

## 2021-04-29 LAB — COMP PANEL: LEUKEMIA/LYMPHOMA

## 2021-05-07 ENCOUNTER — Inpatient Hospital Stay: Payer: BC Managed Care – PPO | Attending: Internal Medicine | Admitting: Internal Medicine

## 2021-05-07 ENCOUNTER — Other Ambulatory Visit: Payer: Self-pay

## 2021-05-07 DIAGNOSIS — D72820 Lymphocytosis (symptomatic): Secondary | ICD-10-CM

## 2021-05-07 NOTE — Progress Notes (Signed)
I connected with Jaime Tate on  05/07/2021 at  1:00 PM EDT by video enabled telemedicine visit and verified that I am speaking with the correct person using two identifiers.  I discussed the limitations, risks, security and privacy concerns of performing an evaluation and management service by telemedicine and the availability of in-person appointments. I also discussed with the patient that there Mohar be a patient responsible charge related to this service. The patient expressed understanding and agreed to proceed.    Other persons participating in the visit and their role in the encounter: RN/medical reconciliation Patient's location: home Provider's location: office  Oncology History   No history exists.   Chief Complaint: Lymphocytosis/thrombocytopenia   History of present illness:Jaime Tate 44 y.o.  female with history of mild leukocytosis/thrombocytosis is here today with results of the blood work.  Patient has been diagnosed with COVID.  Patient is vaccinated.  Family has also been infected.   Patient has upper respiratory infections.  Otherwise denies any significant cough fevers difficulty breathing.  Observation/objective: Alert & oriented x 3. In No acute distress.   Assessment and plan: Lymphocytosis #Mild leukocytosis/lymphocytosis [ALC-4000-5000]-likely reactive.  Flow cytometry negative for monoclonal process.  Would not recommend any further work-up.  # mild microcytic anemia-hemoglobin approximately 11; MCV 70s-suspect iron deficiency; ferritin 14; iron saturation 6.9%.  continue oral iron.  # Thrombocytosis [platelets 778-242]-PNTIR suspect reactive/iron deficiency; hold off any MPN work-up at this time.  # COVID- [family] vaccinated- mild URI symptoms- monitor for now/.  If any worsening symptoms recommend follow-up with PCP..  # DISPOSITION: # follow up as needed-Dr.B  Cc: Jaime Tate-FNP    Follow-up instructions:  I discussed the assessment and  treatment plan with the patient.  The patient was provided an opportunity to ask questions and all were answered.  The patient agreed with the plan and demonstrated understanding of instructions.  The patient was advised to call back or seek an in person evaluation if the symptoms worsen or if the condition fails to improve as anticipated.  Dr. Charlaine Dalton [facility] at [facility] 05/09/2021 4:53 PM

## 2021-05-07 NOTE — Assessment & Plan Note (Addendum)
#  Mild leukocytosis/lymphocytosis [ALC-4000-5000]-likely reactive.  Flow cytometry negative for monoclonal process.  Would not recommend any further work-up.  # mild microcytic anemia-hemoglobin approximately 11; MCV 70s-suspect iron deficiency; ferritin 14; iron saturation 6.9%.  continue oral iron.  # Thrombocytosis [platelets 308-657]-QIONG suspect reactive/iron deficiency; hold off any MPN work-up at this time.  # COVID- [family] vaccinated- mild URI symptoms- monitor for now/.  If any worsening symptoms recommend follow-up with PCP..  # DISPOSITION: # follow up as needed-Dr.B  Cc: Ms.Arnett-FNP

## 2021-05-20 ENCOUNTER — Telehealth: Payer: Self-pay

## 2021-05-20 NOTE — Telephone Encounter (Signed)
I called patient to let her know that accidentally her stool cards had been placed upfront in Margaret's folder with no phone nite to me. Pt stated that her husband had dropped the by Friday Denz 20th to [place]. I apologized for the confusion & explained that Roderic Palau was new & did not realize what they were. Pt was very understanding & laughed about the situation. She is willing to redo stool cards & I will place upfront for husband to pick up.

## 2021-05-27 ENCOUNTER — Other Ambulatory Visit (INDEPENDENT_AMBULATORY_CARE_PROVIDER_SITE_OTHER): Payer: BC Managed Care – PPO

## 2021-05-27 ENCOUNTER — Other Ambulatory Visit: Payer: Self-pay | Admitting: *Deleted

## 2021-05-27 DIAGNOSIS — Z1211 Encounter for screening for malignant neoplasm of colon: Secondary | ICD-10-CM | POA: Diagnosis not present

## 2021-05-27 LAB — FECAL OCCULT BLOOD, IMMUNOCHEMICAL: Fecal Occult Bld: NEGATIVE

## 2021-06-14 ENCOUNTER — Other Ambulatory Visit: Payer: Self-pay | Admitting: Family

## 2021-06-14 DIAGNOSIS — Z1231 Encounter for screening mammogram for malignant neoplasm of breast: Secondary | ICD-10-CM

## 2021-06-17 DIAGNOSIS — H66002 Acute suppurative otitis media without spontaneous rupture of ear drum, left ear: Secondary | ICD-10-CM | POA: Diagnosis not present

## 2021-06-24 ENCOUNTER — Other Ambulatory Visit: Payer: Self-pay | Admitting: Family

## 2021-06-24 DIAGNOSIS — D509 Iron deficiency anemia, unspecified: Secondary | ICD-10-CM

## 2021-07-28 ENCOUNTER — Other Ambulatory Visit: Payer: Self-pay

## 2021-07-28 ENCOUNTER — Other Ambulatory Visit: Payer: Self-pay | Admitting: Family

## 2021-07-28 ENCOUNTER — Ambulatory Visit
Admission: RE | Admit: 2021-07-28 | Discharge: 2021-07-28 | Disposition: A | Discharge status: Home / Self Care | Payer: BC Managed Care – PPO | Source: Ambulatory Visit | Attending: Family | Admitting: Family

## 2021-07-28 ENCOUNTER — Telehealth: Payer: Self-pay | Admitting: Family

## 2021-07-28 DIAGNOSIS — K2100 Gastro-esophageal reflux disease with esophagitis, without bleeding: Secondary | ICD-10-CM

## 2021-07-28 DIAGNOSIS — Z1231 Encounter for screening mammogram for malignant neoplasm of breast: Secondary | ICD-10-CM | POA: Insufficient documentation

## 2021-07-28 MED ORDER — PANTOPRAZOLE SODIUM 20 MG PO TBEC: 20.0000 mg | DELAYED_RELEASE_TABLET | Freq: Every day | ORAL | 0 refills | Status: DC

## 2021-07-28 MED ORDER — DEXLANSOPRAZOLE 30 MG PO CPDR: 30.0000 mg | DELAYED_RELEASE_CAPSULE | Freq: Every day | ORAL | 2 refills | Status: DC

## 2021-07-28 NOTE — Telephone Encounter (Signed)
Call pt dexilant not covered by insurance  I have sent in protonix  If ineffective, please ask her to sch her an appt

## 2021-07-28 NOTE — Telephone Encounter (Signed)
Patient's insurance is not covering the Dexlansoprazole 30 mg will cover:  Prilosec Protonix Nexium.

## 2021-07-28 NOTE — Telephone Encounter (Signed)
Noted Please update chart, dc protoinx, add dexilant back

## 2021-07-28 NOTE — Telephone Encounter (Signed)
Protonix d/c. Dexilant reordered.

## 2021-07-28 NOTE — Addendum Note (Signed)
Addended by: Burnard Hawthorne on: 07/28/2021 12:06 PM   Modules accepted: Orders

## 2021-07-28 NOTE — Telephone Encounter (Signed)
Pt states Protonix didn't work well for her in the past so she's just going to see what the out of pocket cost is and pay for it.

## 2021-09-18 ENCOUNTER — Other Ambulatory Visit: Payer: Self-pay | Admitting: Family

## 2021-09-18 DIAGNOSIS — D509 Iron deficiency anemia, unspecified: Secondary | ICD-10-CM

## 2021-10-23 ENCOUNTER — Telehealth: Payer: Self-pay | Admitting: Family

## 2021-10-23 DIAGNOSIS — K2100 Gastro-esophageal reflux disease with esophagitis, without bleeding: Secondary | ICD-10-CM

## 2021-10-25 ENCOUNTER — Other Ambulatory Visit: Payer: Self-pay

## 2021-10-25 NOTE — Telephone Encounter (Signed)
Pt called in requesting refill on medication (pantoprazole (PROTONIX) 20 MG tablet). Pt stated that she had called the pharmacy and the pharmacy advise Pt that the doctor didn't approve the medication. Pt stated that she have a week worth of medicine left. Pt stated that insurance wouldn't cover for medication (Dexlansoprazole (DEXILANT) 30 MG capsule). Pt is requesting callback

## 2021-10-25 NOTE — Telephone Encounter (Signed)
Okay to send back in protonix for patient? Dexilant was not covered.

## 2021-10-26 MED ORDER — PANTOPRAZOLE SODIUM 20 MG PO TBEC: 20.0000 mg | DELAYED_RELEASE_TABLET | ORAL | 1 refills | Status: DC

## 2021-10-26 NOTE — Telephone Encounter (Signed)
Call pt I sent in protonix

## 2021-10-26 NOTE — Addendum Note (Signed)
Addended by: Burnard Hawthorne on: 10/26/2021 12:47 PM   Modules accepted: Orders

## 2021-12-17 ENCOUNTER — Telehealth: Payer: Self-pay | Admitting: Family

## 2021-12-17 ENCOUNTER — Other Ambulatory Visit: Payer: Self-pay

## 2021-12-17 ENCOUNTER — Encounter: Payer: Self-pay | Admitting: Family

## 2021-12-17 ENCOUNTER — Ambulatory Visit: Payer: BC Managed Care – PPO | Admitting: Family

## 2021-12-17 VITALS — BP 118/82 | HR 86 | Temp 98.5°F | Ht 64.0 in | Wt 230.6 lb

## 2021-12-17 DIAGNOSIS — D649 Anemia, unspecified: Secondary | ICD-10-CM | POA: Diagnosis not present

## 2021-12-17 DIAGNOSIS — H93A2 Pulsatile tinnitus, left ear: Secondary | ICD-10-CM | POA: Diagnosis not present

## 2021-12-17 LAB — CBC WITH DIFFERENTIAL/PLATELET
Absolute Monocytes: 809 cells/uL (ref 200–950)
Basophils Absolute: 83 cells/uL (ref 0–200)
Basophils Relative: 0.7 %
Eosinophils Absolute: 274 cells/uL (ref 15–500)
Eosinophils Relative: 2.3 %
HCT: 40.8 % (ref 35.0–45.0)
Hemoglobin: 13.4 g/dL (ref 11.7–15.5)
Lymphs Abs: 4391 cells/uL — ABNORMAL HIGH (ref 850–3900)
MCH: 25.8 pg — ABNORMAL LOW (ref 27.0–33.0)
MCHC: 32.8 g/dL (ref 32.0–36.0)
MCV: 78.6 fL — ABNORMAL LOW (ref 80.0–100.0)
MPV: 10.3 fL (ref 7.5–12.5)
Monocytes Relative: 6.8 %
Neutro Abs: 6343 cells/uL (ref 1500–7800)
Neutrophils Relative %: 53.3 %
Platelets: 437 10*3/uL — ABNORMAL HIGH (ref 140–400)
RBC: 5.19 10*6/uL — ABNORMAL HIGH (ref 3.80–5.10)
RDW: 14.5 % (ref 11.0–15.0)
Total Lymphocyte: 36.9 %
WBC: 11.9 10*3/uL — ABNORMAL HIGH (ref 3.8–10.8)

## 2021-12-17 NOTE — Patient Instructions (Signed)
I am working quickly to order an MRI to evaluate for the pulsatile tinnitus that you are experiencing.  As we discussed, please not hesitate to call us on Monday if this has not been scheduled.  My plan would be to have this scheduled for next week.  I have preemptively placed referral to ENT for further evaluation as we also discussed

## 2021-12-17 NOTE — Telephone Encounter (Signed)
Jaime Tate FYI I also placed urgent ref to ENT   Thanks for so much for getting referral, MRI brain and MRA brain scheduled

## 2021-12-17 NOTE — Progress Notes (Signed)
Subjective:    Patient ID: Jaime Tate, female    DOB: [birth year], 46 y.o.   MRN: [id number]  CC: Jaime Tate is a 46 y.o. female who presents today for an acute visit.    HPI: She complains of intermittent pulsing in left ear and constant left ear pressure, first noticed 8 months ago, worsening, comes and goes. Will slowly 'fade out' She hears a 'whooshing' sound and describes pulsing is periodic. This is associated with left ear pressure.  Very loud at night and most bothersome when laying on left ear at night.  If she is upright  and turn quickly No tinnitus,vertigo,  hearing loss, HA, vision changes, congestion.   No recent URI She had Covid 04/2021 No h/o HTN She is using flonase and did a trial of flonase daily to see if helped.  No family history of aneurysm  She is taking ferrous sulfate 325mg  qd.   Seen by dr Rogue Bussing 04/27/21 for lymphocytosis, microcytic anemia, thrombocytosis  HISTORY:  Past Medical History:  Diagnosis Date   Allergy    Seasonal   Anxiety    Chicken pox    GERD (gastroesophageal reflux disease)    No past surgical history on file. Family History  Problem Relation Age of Onset   Arthritis Mother    Osteoporosis Mother    Heart disease Father    Hypertension Father    Diabetes Father    Basal cell carcinoma Father    Cancer Maternal Grandmother 37       Breast   Breast cancer Maternal Grandmother 81   Cancer Paternal Grandmother 25       Colon   Lung cancer Paternal Grandfather    Melanoma Paternal Grandfather    Ovarian cancer Neg Hx     Allergies: Biaxin [clarithromycin] Current Outpatient Medications on File Prior to Visit  Medication Sig Dispense Refill   Acetaminophen (TYLENOL PO) Take by mouth.     Cholecalciferol (VITAMIN D3) 50 MCG (2000 UT) capsule Take 2,000 Units by mouth daily.     escitalopram (LEXAPRO) 10 MG tablet TAKE 1 TABLET BY MOUTH EVERY DAY 90 tablet 2   ferrous sulfate 325 (65 FE) MG EC tablet TAKE 1  TABLET BY MOUTH 2 TIMES DAILY WITH A MEAL. 180 tablet 1   fexofenadine (ALLEGRA) 60 MG tablet Take 60 mg by mouth daily.     Ibuprofen (ADVIL PO) Take by mouth.     Misc Natural Products (ELDERBERRY ZINC/VIT C/IMMUNE MT) Use as directed in the mouth or throat.     Omega 3 1000 MG CAPS Take 2,000 mg by mouth daily.     pantoprazole (PROTONIX) 20 MG tablet Take 1 tablet (20 mg total) by mouth every morning. Take 30 minutes to hour before breakfast 90 tablet 1   Probiotic Product (PROBIOTIC ADVANCED PO) Take by mouth.     No current facility-administered medications on file prior to visit.    Social History   Tobacco Use   Smoking status: Never   Smokeless tobacco: Never  Substance Use Topics   Alcohol use: Yes    Alcohol/week: 0.0 standard drinks    Comment: 1 drink per week   Drug use: No    Review of Systems  Constitutional:  Negative for chills and fever.  HENT:  Positive for ear pain. Negative for congestion, ear discharge, hearing loss and sinus pressure.   Eyes:  Negative for pain and visual disturbance.  Respiratory:  Negative for cough.  Cardiovascular:  Negative for chest pain and palpitations.  Gastrointestinal:  Negative for nausea and vomiting.  Neurological:  Negative for dizziness, light-headedness, numbness and headaches.     Objective:    BP 118/82 (BP Location: Left Arm, Patient Position: Sitting, Cuff Size: Large)    Pulse 86    Temp 98.5 F (36.9 C) (Oral)    Ht 5\' 4"  (1.626 m)    Wt 230 lb 9.6 oz (104.6 kg)    SpO2 98%    BMI 39.58 kg/m    Physical Exam Vitals reviewed.  Constitutional:      Appearance: She is well-developed.  HENT:     Head: Normocephalic and atraumatic.     Right Ear: Hearing, tympanic membrane, ear canal and external ear normal. No decreased hearing noted. No drainage, swelling or tenderness. No middle ear effusion. No foreign body. Tympanic membrane is not erythematous or bulging.     Left Ear: Hearing, tympanic membrane, ear canal  and external ear normal. No decreased hearing noted. No drainage, swelling or tenderness.  No middle ear effusion. No foreign body. Tympanic membrane is not erythematous or bulging.     Nose: Nose normal. No rhinorrhea.     Right Sinus: No maxillary sinus tenderness or frontal sinus tenderness.     Left Sinus: No maxillary sinus tenderness or frontal sinus tenderness.     Mouth/Throat:     Pharynx: Uvula midline. No oropharyngeal exudate or posterior oropharyngeal erythema.     Tonsils: No tonsillar abscesses.  Eyes:     General: Lids are normal. Lids are everted, no foreign bodies appreciated.     Conjunctiva/sclera: Conjunctivae normal.     Pupils: Pupils are equal, round, and reactive to light.     Comments: Normal fundus bilaterally   Cardiovascular:     Rate and Rhythm: Normal rate and regular rhythm.     Pulses: Normal pulses.     Heart sounds: Normal heart sounds.     Comments: No carotid bruit Pulmonary:     Effort: Pulmonary effort is normal.     Breath sounds: Normal breath sounds. No wheezing, rhonchi or rales.  Lymphadenopathy:     Head:     Right side of head: No submental, submandibular, tonsillar, preauricular, posterior auricular or occipital adenopathy.     Left side of head: No submental, submandibular, tonsillar, preauricular, posterior auricular or occipital adenopathy.     Cervical: No cervical adenopathy.     Right cervical: No superficial, deep or posterior cervical adenopathy.    Left cervical: No superficial, deep or posterior cervical adenopathy.  Skin:    General: Skin is warm and dry.  Neurological:     Mental Status: She is alert.     Cranial Nerves: No cranial nerve deficit.     Sensory: No sensory deficit.     Deep Tendon Reflexes:     Reflex Scores:      Bicep reflexes are 2+ on the right side and 2+ on the left side.      Patellar reflexes are 2+ on the right side and 2+ on the left side.    Comments: Grip equal and strong bilateral upper  extremities. Gait strong and steady. Able to perform  finger-to-nose without difficulty.   Psychiatric:        Speech: Speech normal.        Behavior: Behavior normal.        Thought Content: Thought content normal.       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  Discussed with her my concern to rule out underlying vascular etiology.  I ordered a stat MRI brain, MRA brain to evaluate for aneurysm.  I have also placed a referral to ENT as marked as urgent as well.      Relevant Orders   Ambulatory referral to ENT   MR BRAIN W WO CONTRAST   MR ANGIO HEAD WO CONTRAST   Other Visit Diagnoses     Anemia, unspecified type       Relevant Orders   CBC with Differential/Platelet         I am having Jessyka N. Nin maintain her Ibuprofen (ADVIL PO), Acetaminophen (TYLENOL PO), fexofenadine, Vitamin D3, escitalopram, Omega 3, Probiotic Product (PROBIOTIC ADVANCED PO), Misc Natural Products (ELDERBERRY ZINC/VIT C/IMMUNE MT), ferrous sulfate, and pantoprazole.   No orders of the defined types were placed in this encounter.   Return precautions given.   Risks, benefits, and alternatives of the medications and treatment plan prescribed today were discussed, and patient expressed understanding.   Education regarding symptom management and diagnosis given to patient on AVS.  Continue to follow with Burnard Hawthorne, FNP for routine health maintenance.   Abbott Pao Jollie and I agreed with plan.   Mable Paris, FNP

## 2021-12-17 NOTE — Assessment & Plan Note (Signed)
Reassuring HEENT, neurologic evaluation today.  Discussed with her my concern to rule out underlying vascular etiology.  I ordered a stat MRI brain, MRA brain to evaluate for aneurysm.  I have also placed a referral to ENT as marked as urgent as well.

## 2021-12-17 NOTE — Telephone Encounter (Signed)
Jaime Tate I have ordered stat MRI brain MRA brain   How soon can we schedule?  [place] am [place] but wanted to get this scheduled as soon as possible next week.   [name], please follow this and ensure is scheduled asap

## 2021-12-20 ENCOUNTER — Other Ambulatory Visit: Payer: Self-pay | Admitting: Family

## 2021-12-20 ENCOUNTER — Ambulatory Visit
Admission: RE | Admit: 2021-12-20 | Discharge: 2021-12-20 | Disposition: A | Discharge status: Home / Self Care | Payer: BC Managed Care – PPO | Source: Ambulatory Visit | Attending: Family | Admitting: Family

## 2021-12-20 DIAGNOSIS — H93A2 Pulsatile tinnitus, left ear: Secondary | ICD-10-CM | POA: Diagnosis not present

## 2021-12-20 DIAGNOSIS — H9312 Tinnitus, left ear: Secondary | ICD-10-CM | POA: Diagnosis not present

## 2021-12-20 DIAGNOSIS — R930 Abnormal findings on diagnostic imaging of skull and head, not elsewhere classified: Secondary | ICD-10-CM

## 2021-12-20 MED ORDER — GADOBUTROL 1 MMOL/ML IV SOLN
10.0000 mL | Freq: Once | INTRAVENOUS | Status: AC | PRN
  Administered 2021-12-20: 10 mL via INTRAVENOUS

## 2021-12-24 ENCOUNTER — Telehealth: Payer: Self-pay

## 2021-12-24 NOTE — Telephone Encounter (Signed)
Spoke with pt and she stated that she still has not heard from Neurology for an appt.

## 2021-12-27 ENCOUNTER — Encounter: Payer: Self-pay | Admitting: Neurology

## 2021-12-29 ENCOUNTER — Other Ambulatory Visit: Payer: Self-pay | Admitting: Internal Medicine

## 2022-01-19 DIAGNOSIS — H9312 Tinnitus, left ear: Secondary | ICD-10-CM | POA: Diagnosis not present

## 2022-01-19 DIAGNOSIS — H93A2 Pulsatile tinnitus, left ear: Secondary | ICD-10-CM | POA: Diagnosis not present

## 2022-01-21 ENCOUNTER — Other Ambulatory Visit: Payer: Self-pay | Admitting: Unknown Physician Specialty

## 2022-01-21 DIAGNOSIS — H93A2 Pulsatile tinnitus, left ear: Secondary | ICD-10-CM

## 2022-02-03 ENCOUNTER — Ambulatory Visit
Admission: RE | Admit: 2022-02-03 | Discharge: 2022-02-03 | Disposition: A | Discharge status: Home / Self Care | Payer: BC Managed Care – PPO | Source: Ambulatory Visit | Attending: Unknown Physician Specialty | Admitting: Unknown Physician Specialty

## 2022-02-03 DIAGNOSIS — H93A2 Pulsatile tinnitus, left ear: Secondary | ICD-10-CM | POA: Diagnosis not present

## 2022-02-03 NOTE — Progress Notes (Signed)
Patient would like to speak with you about  Getting a colonoscopy ?

## 2022-02-04 ENCOUNTER — Ambulatory Visit (INDEPENDENT_AMBULATORY_CARE_PROVIDER_SITE_OTHER): Payer: BC Managed Care – PPO | Admitting: Family

## 2022-02-04 ENCOUNTER — Encounter: Payer: Self-pay | Admitting: Family

## 2022-02-04 ENCOUNTER — Other Ambulatory Visit: Payer: Self-pay

## 2022-02-04 VITALS — BP 122/74 | HR 77 | Temp 98.5°F | Ht 64.0 in | Wt 228.4 lb

## 2022-02-04 DIAGNOSIS — Z0000 Encounter for general adult medical examination without abnormal findings: Secondary | ICD-10-CM

## 2022-02-04 DIAGNOSIS — H93A2 Pulsatile tinnitus, left ear: Secondary | ICD-10-CM | POA: Diagnosis not present

## 2022-02-04 DIAGNOSIS — Z1211 Encounter for screening for malignant neoplasm of colon: Secondary | ICD-10-CM

## 2022-02-04 LAB — CBC WITH DIFFERENTIAL/PLATELET
Basophils Absolute: 0 10*3/uL (ref 0.0–0.1)
Basophils Relative: 0.3 % (ref 0.0–3.0)
Eosinophils Absolute: 0.2 10*3/uL (ref 0.0–0.7)
Eosinophils Relative: 2.1 % (ref 0.0–5.0)
HCT: 42.5 % (ref 36.0–46.0)
Hemoglobin: 13.5 g/dL (ref 12.0–15.0)
Lymphocytes Relative: 37 % (ref 12.0–46.0)
Lymphs Abs: 3.5 10*3/uL (ref 0.7–4.0)
MCHC: 31.8 g/dL (ref 30.0–36.0)
MCV: 79.4 fl (ref 78.0–100.0)
Monocytes Absolute: 0.4 10*3/uL (ref 0.1–1.0)
Monocytes Relative: 4.3 % (ref 3.0–12.0)
Neutro Abs: 5.3 10*3/uL (ref 1.4–7.7)
Neutrophils Relative %: 56.3 % (ref 43.0–77.0)
Platelets: 364 10*3/uL (ref 150.0–400.0)
RBC: 5.35 Mil/uL — ABNORMAL HIGH (ref 3.87–5.11)
RDW: 16 % — ABNORMAL HIGH (ref 11.5–15.5)
WBC: 9.5 10*3/uL (ref 4.0–10.5)

## 2022-02-04 LAB — VITAMIN D 25 HYDROXY (VIT D DEFICIENCY, FRACTURES): VITD: 46.86 ng/mL (ref 30.00–100.00)

## 2022-02-04 LAB — COMPREHENSIVE METABOLIC PANEL
ALT: 20 U/L (ref 0–35)
AST: 17 U/L (ref 0–37)
Albumin: 4.3 g/dL (ref 3.5–5.2)
Alkaline Phosphatase: 90 U/L (ref 39–117)
BUN: 9 mg/dL (ref 6–23)
CO2: 25 mEq/L (ref 19–32)
Calcium: 9.4 mg/dL (ref 8.4–10.5)
Chloride: 102 mEq/L (ref 96–112)
Creatinine, Ser: 0.77 mg/dL (ref 0.40–1.20)
GFR: 93.3 mL/min (ref >60.00)
Glucose, Bld: 106 mg/dL — ABNORMAL HIGH (ref 70–99)
Potassium: 4.6 mEq/L (ref 3.5–5.1)
Sodium: 137 mEq/L (ref 135–145)
Total Bilirubin: 0.4 mg/dL (ref 0.2–1.2)
Total Protein: 7.1 g/dL (ref 6.0–8.3)

## 2022-02-04 LAB — LIPID PANEL
Cholesterol: 191 mg/dL (ref 0–200)
HDL: 48 mg/dL (ref >39.00)
LDL Cholesterol: 124 mg/dL — ABNORMAL HIGH (ref 0–99)
NonHDL: 142.97
Total CHOL/HDL Ratio: 4
Triglycerides: 95 mg/dL (ref 0.0–149.0)
VLDL: 19 mg/dL (ref 0.0–40.0)

## 2022-02-04 LAB — HEMOGLOBIN A1C: Hgb A1c MFr Bld: 6.5 % (ref 4.6–6.5)

## 2022-02-04 LAB — TSH: TSH: 2.1 u[IU]/mL (ref 0.35–5.50)

## 2022-02-04 NOTE — Assessment & Plan Note (Signed)
Clinical breast exam performed today.  Referral  for colonoscopy.  Deferred pelvic exam in the absence of complaints and Pap smear is up-to-date. ?

## 2022-02-04 NOTE — Progress Notes (Signed)
? ?Subjective:  ? ? Patient ID: Jaime Tate, female    DOB: [birth year], 46 y.o.   MRN: [id number] ? ?CC: Jaime Tate is a 46 y.o. female who presents today for physical exam.   ? ?HPI: Feels well today.  No new complaints. ? ?Follows with dermatology  ? ?She continues to have left pulsatile tinnitus.  This is unchanged.  She is using a fan, and light music in our office which is somewhat helpful. consult with ENT Dr. Tami Ribas last month.  She has follow-up with him 4 months . she has upcoming appointment with neurology as well ? ?She is also seen ophthalmology per patient for evaluation of idiopathic intracranial hypertension ? ?Colorectal Cancer Screening: due ?Breast Cancer Screening: Mammogram UTD ?Cervical Cancer Screening: UTD, 11 months ago, negative HPV malignancy ? ? ? ?      Tetanus - utd ?       ?Labs: Screening labs today. ?Exercise: Gets regular exercise with walking. No cp, sob .   ?Alcohol use: Occasionally ?Smoking/tobacco use: Nonsmoker.   ? ? ?HISTORY:  ?Past Medical History:  ?Diagnosis Date  ? Allergy   ? Seasonal  ? Anxiety   ? Chicken pox   ? GERD (gastroesophageal reflux disease)   ?  ?History reviewed. No pertinent surgical history. ?Family History  ?Problem Relation Age of Onset  ? Arthritis Mother   ? Osteoporosis Mother   ? Heart disease Father   ? Hypertension Father   ? Diabetes Father   ? Basal cell carcinoma Father   ? Cancer Maternal Grandmother 69  ?     Breast  ? Breast cancer Maternal Grandmother 49  ? Cancer Paternal Grandmother 55  ?     Colon  ? Lung cancer Paternal Grandfather   ? Melanoma Paternal Grandfather   ? Ovarian cancer Neg Hx   ? ?  ? ?ALLERGIES: Biaxin [clarithromycin] ? ?Current Outpatient Medications on File Prior to Visit  ?Medication Sig Dispense Refill  ? Acetaminophen (TYLENOL PO) Take by mouth.    ? Cholecalciferol (VITAMIN D3) 50 MCG (2000 UT) capsule Take 2,000 Units by mouth daily.    ? escitalopram (LEXAPRO) 10 MG tablet TAKE 1 TABLET BY MOUTH EVERY  DAY 90 tablet 2  ? ferrous sulfate 325 (65 FE) MG EC tablet TAKE 1 TABLET BY MOUTH 2 TIMES DAILY WITH A MEAL. 180 tablet 1  ? fexofenadine (ALLEGRA) 60 MG tablet Take 60 mg by mouth daily.    ? Ibuprofen (ADVIL PO) Take by mouth.    ? Misc Natural Products (ELDERBERRY ZINC/VIT C/IMMUNE MT) Use as directed in the mouth or throat.    ? Omega 3 1000 MG CAPS Take 2,000 mg by mouth daily.    ? pantoprazole (PROTONIX) 20 MG tablet Take 1 tablet (20 mg total) by mouth every morning. Take 30 minutes to hour before breakfast 90 tablet 1  ? Probiotic Product (PROBIOTIC ADVANCED PO) Take by mouth.    ? ?No current facility-administered medications on file prior to visit.  ? ? ?Social History  ? ?Tobacco Use  ? Smoking status: Never  ? Smokeless tobacco: Never  ?Substance Use Topics  ? Alcohol use: Yes  ?  Alcohol/week: 0.0 standard drinks  ?  Comment: 1 drink per week  ? Drug use: No  ? ? ?Review of Systems  ?Constitutional:  Negative for chills, fever and unexpected weight change.  ?HENT:  Negative for congestion and ear pain.   ?Eyes:  Negative for  visual disturbance.  ?Respiratory:  Negative for cough.   ?Cardiovascular:  Negative for chest pain, palpitations and leg swelling.  ?Gastrointestinal:  Negative for nausea and vomiting.  ?Musculoskeletal:  Negative for arthralgias and myalgias.  ?Skin:  Negative for rash.  ?Neurological:  Negative for headaches.  ?Hematological:  Negative for adenopathy.  ?Psychiatric/Behavioral:  Negative for confusion.   ?   ?Objective:  ?  ?BP 122/74 (BP Location: Left Arm, Patient Position: Sitting, Cuff Size: Large)   Pulse 77   Temp 98.5 ?F (36.9 ?C) (Oral)   Ht 5\' 4"  (1.626 m)   Wt 228 lb 6.4 oz (103.6 kg)   SpO2 98%   BMI 39.20 kg/m?  ? ?BP Readings from Last 3 Encounters:  ?02/04/22 122/74  ?12/17/21 118/82  ?04/27/21 (!) 144/88  ? ?Wt Readings from Last 3 Encounters:  ?02/04/22 228 lb 6.4 oz (103.6 kg)  ?12/17/21 230 lb 9.6 oz (104.6 kg)  ?04/27/21 230 lb (104.3 kg)  ? ? ?Physical  Exam ?Vitals reviewed.  ?Constitutional:   ?   Appearance: Normal appearance. She is well-developed.  ?Eyes:  ?   Conjunctiva/sclera: Conjunctivae normal.  ?Neck:  ?   Thyroid: No thyroid mass or thyromegaly.  ?Cardiovascular:  ?   Rate and Rhythm: Normal rate and regular rhythm.  ?   Pulses: Normal pulses.  ?   Heart sounds: Normal heart sounds.  ?Pulmonary:  ?   Effort: Pulmonary effort is normal.  ?   Breath sounds: Normal breath sounds. No wheezing, rhonchi or rales.  ?Chest:  ?Breasts: ?   Breasts are symmetrical.  ?   Right: No inverted nipple, mass, nipple discharge, skin change or tenderness.  ?   Left: No inverted nipple, mass, nipple discharge, skin change or tenderness.  ?Abdominal:  ?   General: Bowel sounds are normal. There is no distension.  ?   Palpations: Abdomen is soft. Abdomen is not rigid. There is no fluid wave or mass.  ?   Tenderness: There is no abdominal tenderness. There is no guarding or rebound.  ?Lymphadenopathy:  ?   Head:  ?   Right side of head: No submental, submandibular, tonsillar, preauricular, posterior auricular or occipital adenopathy.  ?   Left side of head: No submental, submandibular, tonsillar, preauricular, posterior auricular or occipital adenopathy.  ?   Cervical: No cervical adenopathy.  ?   Right cervical: No superficial, deep or posterior cervical adenopathy. ?   Left cervical: No superficial, deep or posterior cervical adenopathy.  ?Skin: ?   General: Skin is warm and dry.  ?Neurological:  ?   Mental Status: She is alert.  ?Psychiatric:     ?   Speech: Speech normal.     ?   Behavior: Behavior normal.     ?   Thought Content: Thought content normal.  ? ? ?   ?Assessment & Plan:  ? ?Problem List Items Addressed This Visit   ? ?  ? Nervous and Auditory  ? Pulsatile tinnitus of left ear  ?  Chronic, stable.  Reviewed consult note with Dr Aundra Dubin.  Has upcoming appoint with Dr. Tomi Likens as well.  Will follow ?  ?  ?  ? Other  ? Routine general medical examination at a  health care facility - Primary  ?  Clinical breast exam performed today.  Referral  for colonoscopy.  Deferred pelvic exam in the absence of complaints and Pap smear is up-to-date. ?  ?  ? Relevant Orders  ?  VITAMIN D 25 Hydroxy (Vit-D Deficiency, Fractures)  ? Hemoglobin A1c  ? TSH  ? CBC with Differential/Platelet  ? Comprehensive metabolic panel  ? Lipid panel  ? ?Other Visit Diagnoses   ? ? Screening for colon cancer      ? Relevant Orders  ? Ambulatory referral to Gastroenterology  ? ?  ? ? ? ?I am having Nakema N. Phagan maintain her Ibuprofen (ADVIL PO), Acetaminophen (TYLENOL PO), fexofenadine, Vitamin D3, Omega 3, Probiotic Product (PROBIOTIC ADVANCED PO), Misc Natural Products (ELDERBERRY ZINC/VIT C/IMMUNE MT), ferrous sulfate, pantoprazole, and escitalopram. ? ? ?No orders of the defined types were placed in this encounter. ? ? ?Return precautions given.  ? ?Risks, benefits, and alternatives of the medications and treatment plan prescribed today were discussed, and patient expressed understanding.  ? ?Education regarding symptom management and diagnosis given to patient on AVS.  ? ?Continue to follow with Burnard Hawthorne, FNP for routine health maintenance.  ? ?Nikitia Asbill Severino and I agreed with plan.  ? ?Mable Paris, FNP ? ? ? ? ?

## 2022-02-04 NOTE — Assessment & Plan Note (Signed)
Chronic, stable.  Reviewed consult note with Dr Aundra Dubin.  Has upcoming appoint with Dr. Tomi Likens as well.  Will follow ?

## 2022-02-04 NOTE — Patient Instructions (Signed)
Very nice to see you! ? ?Health Maintenance, Female ?Adopting a healthy lifestyle and getting preventive care are important in promoting health and wellness. Ask your health care provider about: ?The right schedule for you to have regular tests and exams. ?Things you can do on your own to prevent diseases and keep yourself healthy. ?What should I know about diet, weight, and exercise? ?Eat a healthy diet ? ?Eat a diet that includes plenty of vegetables, fruits, low-fat dairy products, and lean protein. ?Do not eat a lot of foods that are high in solid fats, added sugars, or sodium. ?Maintain a healthy weight ?Body mass index (BMI) is used to identify weight problems. It estimates body fat based on height and weight. Your health care provider can help determine your BMI and help you achieve or maintain a healthy weight. ?Get regular exercise ?Get regular exercise. This is one of the most important things you can do for your health. Most adults should: ?Exercise for at least 150 minutes each week. The exercise should increase your heart rate and make you sweat (moderate-intensity exercise). ?Do strengthening exercises at least twice a week. This is in addition to the moderate-intensity exercise. ?Spend less time sitting. Even light physical activity can be beneficial. ?Watch cholesterol and blood lipids ?Have your blood tested for lipids and cholesterol at 46 years of age, then have this test every 5 years. ?Have your cholesterol levels checked more often if: ?Your lipid or cholesterol levels are high. ?You are older than 46 years of age. ?You are at high risk for heart disease. ?What should I know about cancer screening? ?Depending on your health history and family history, you Cardell need to have cancer screening at various ages. This Quade include screening for: ?Breast cancer. ?Cervical cancer. ?Colorectal cancer. ?Skin cancer. ?Lung cancer. ?What should I know about heart disease, diabetes, and high blood  pressure? ?Blood pressure and heart disease ?High blood pressure causes heart disease and increases the risk of stroke. This is more likely to develop in people who have high blood pressure readings or are overweight. ?Have your blood pressure checked: ?Every 3-5 years if you are 65-54 years of age. ?Every year if you are 64 years old or older. ?Diabetes ?Have regular diabetes screenings. This checks your fasting blood sugar level. Have the screening done: ?Once every three years after age 20 if you are at a normal weight and have a low risk for diabetes. ?More often and at a younger age if you are overweight or have a high risk for diabetes. ?What should I know about preventing infection? ?Hepatitis B ?If you have a higher risk for hepatitis B, you should be screened for this virus. Talk with your health care provider to find out if you are at risk for hepatitis B infection. ?Hepatitis C ?Testing is recommended for: ?Everyone born from [birth year] through 1965. ?Anyone with known risk factors for hepatitis C. ?Sexually transmitted infections (STIs) ?Get screened for STIs, including gonorrhea and chlamydia, if: ?You are sexually active and are younger than 46 years of age. ?You are older than 46 years of age and your health care provider tells you that you are at risk for this type of infection. ?Your sexual activity has changed since you were last screened, and you are at increased risk for chlamydia or gonorrhea. Ask your health care provider if you are at risk. ?Ask your health care provider about whether you are at high risk for HIV. Your health care provider Zent recommend a  prescription medicine to help prevent HIV infection. If you choose to take medicine to prevent HIV, you should first get tested for HIV. You should then be tested every 3 months for as long as you are taking the medicine. ?Pregnancy ?If you are about to stop having your period (premenopausal) and you Rauls become pregnant, seek counseling before you  get pregnant. ?Take 400 to 800 micrograms (mcg) of folic acid every day if you become pregnant. ?Ask for birth control (contraception) if you want to prevent pregnancy. ?Osteoporosis and menopause ?Osteoporosis is a disease in which the bones lose minerals and strength with aging. This can result in bone fractures. If you are 68 years old or older, or if you are at risk for osteoporosis and fractures, ask your health care provider if you should: ?Be screened for bone loss. ?Take a calcium or vitamin D supplement to lower your risk of fractures. ?Be given hormone replacement therapy (HRT) to treat symptoms of menopause. ?Follow these instructions at home: ?Alcohol use ?Do not drink alcohol if: ?Your health care provider tells you not to drink. ?You are pregnant, Wyffels be pregnant, or are planning to become pregnant. ?If you drink alcohol: ?Limit how much you have to: ?0-1 drink a day. ?Know how much alcohol is in your drink. In the [place] one drink equals one 12 oz bottle of beer (355 mL), one 5 oz glass of wine (148 mL), or one 1? oz glass of hard liquor (44 mL). ?Lifestyle ?Do not use any products that contain nicotine or tobacco. These products include cigarettes, chewing tobacco, and vaping devices, such as e-cigarettes. If you need help quitting, ask your health care provider. ?Do not use street drugs. ?Do not share needles. ?Ask your health care provider for help if you need support or information about quitting drugs. ?General instructions ?Schedule regular health, dental, and eye exams. ?Stay current with your vaccines. ?Tell your health care provider if: ?You often feel depressed. ?You have ever been abused or do not feel safe at home. ?Summary ?Adopting a healthy lifestyle and getting preventive care are important in promoting health and wellness. ?Follow your health care provider's instructions about healthy diet, exercising, and getting tested or screened for diseases. ?Follow your health care provider's  instructions on monitoring your cholesterol and blood pressure. ?This information is not intended to replace advice given to you by your health care provider. Make sure you discuss any questions you have with your health care provider. ?Document Revised: 04/12/2021 Document Reviewed: 04/12/2021 ?Elsevier Patient Education ? [place]. ? ?

## 2022-02-07 ENCOUNTER — Other Ambulatory Visit: Payer: Self-pay

## 2022-02-07 DIAGNOSIS — Z1211 Encounter for screening for malignant neoplasm of colon: Secondary | ICD-10-CM

## 2022-02-07 MED ORDER — PEG 3350-KCL-NA BICARB-NACL 420 G PO SOLR: 4000.0000 mL | Freq: Once | ORAL | 0 refills | Status: AC

## 2022-02-07 NOTE — Progress Notes (Signed)
Gastroenterology Pre-Procedure Review ? ?Request Date: 04/05/2022 ?Requesting Physician: Dr. Allen Norris ? ?PATIENT REVIEW QUESTIONS: The patient responded to the following health history questions as indicated:   ? ?1. Are you having any GI issues? no ?2. Do you have a personal history of Polyps? no ?3. Do you have a family history of Colon Cancer or Polyps? yes (P.Grandmother- colon cancer; Mother-polyps) ?4. Diabetes Mellitus? no ?5. Joint replacements in the past 12 months?no ?6. Major health problems in the past 3 months?no ?7. Any artificial heart valves, MVP, or defibrillator?no ?   ?MEDICATIONS & ALLERGIES:    ?Patient reports the following regarding taking any anticoagulation/antiplatelet therapy:   ?Plavix, Coumadin, Eliquis, Xarelto, Lovenox, Pradaxa, Brilinta, or Effient? no ?Aspirin? no ? ?Patient confirms/reports the following medications:  ?Current Outpatient Medications  ?Medication Sig Dispense Refill  ? Acetaminophen (TYLENOL PO) Take by mouth.    ? Cholecalciferol (VITAMIN D3) 50 MCG (2000 UT) capsule Take 2,000 Units by mouth daily.    ? escitalopram (LEXAPRO) 10 MG tablet TAKE 1 TABLET BY MOUTH EVERY DAY 90 tablet 2  ? ferrous sulfate 325 (65 FE) MG EC tablet TAKE 1 TABLET BY MOUTH 2 TIMES DAILY WITH A MEAL. 180 tablet 1  ? fexofenadine (ALLEGRA) 60 MG tablet Take 60 mg by mouth daily.    ? Ibuprofen (ADVIL PO) Take by mouth.    ? Misc Natural Products (ELDERBERRY ZINC/VIT C/IMMUNE MT) Use as directed in the mouth or throat.    ? Omega 3 1000 MG CAPS Take 2,000 mg by mouth daily.    ? pantoprazole (PROTONIX) 20 MG tablet Take 1 tablet (20 mg total) by mouth every morning. Take 30 minutes to hour before breakfast 90 tablet 1  ? Probiotic Product (PROBIOTIC ADVANCED PO) Take by mouth.    ? ?No current facility-administered medications for this visit.  ? ? ?Patient confirms/reports the following allergies:  ?Allergies  ?Allergen Reactions  ? Biaxin [Clarithromycin] Diarrhea and Nausea And Vomiting   ? ? ?No orders of the defined types were placed in this encounter. ? ? ?AUTHORIZATION INFORMATION ?Primary Insurance: ?1D#: ?Group #: ? ?Secondary Insurance: ?1D#: ?Group #: ? ?SCHEDULE INFORMATION: ?Date: 04/05/2022 ?Time: ?Location: [facility] ? ?

## 2022-02-08 ENCOUNTER — Telehealth: Payer: Self-pay | Admitting: Family

## 2022-02-08 NOTE — Telephone Encounter (Signed)
Patient returned office phone call for lab results. 

## 2022-02-09 ENCOUNTER — Other Ambulatory Visit: Payer: Self-pay

## 2022-02-09 ENCOUNTER — Telehealth: Payer: Self-pay | Admitting: Family

## 2022-02-09 ENCOUNTER — Encounter: Payer: Self-pay | Admitting: Family

## 2022-02-09 ENCOUNTER — Ambulatory Visit (INDEPENDENT_AMBULATORY_CARE_PROVIDER_SITE_OTHER): Payer: BC Managed Care – PPO | Admitting: Family

## 2022-02-09 VITALS — BP 132/78 | HR 98 | Temp 97.8°F | Ht 67.0 in | Wt 229.6 lb

## 2022-02-09 DIAGNOSIS — E119 Type 2 diabetes mellitus without complications: Secondary | ICD-10-CM | POA: Diagnosis not present

## 2022-02-09 DIAGNOSIS — E785 Hyperlipidemia, unspecified: Secondary | ICD-10-CM | POA: Diagnosis not present

## 2022-02-09 MED ORDER — ROSUVASTATIN CALCIUM 5 MG PO TABS: 5.0000 mg | ORAL_TABLET | Freq: Every evening | ORAL | 3 refills | Status: DC

## 2022-02-09 MED ORDER — METFORMIN HCL ER 500 MG PO TB24: 500.0000 mg | ORAL_TABLET | Freq: Every evening | ORAL | 2 refills | Status: DC

## 2022-02-09 NOTE — Telephone Encounter (Signed)
Call pt ? ?Spoke with gi ? ?GI advised that she need to take half dose of oral diabetic medication prior to colonoscopy. ? ?  However since metformin is not anything she can "half".   ? ?She Eid hold metformin the night before procedure.  She will be able to take Crestor and metformin the evening AFTER procedure ? ?GI advised to drink plenty of water ?

## 2022-02-09 NOTE — Progress Notes (Signed)
? ?Subjective:  ? ? Patient ID: Jaime Tate, female    DOB: 1976/01/16, 46 y.o.   MRN: [id number] ? ?CC: Jaime Tate is a 46 y.o. female who presents today for follow up.  ? ?HPI: Feels well today.  No new complaints ? ?New diagnosis of diabetes.  ? ?She has been committed to eating healthier, walking more and eliminating sweet tea ?Father has h/o MI and DM.  ? ? ?Colonoscopy is scheduled ?HISTORY:  ?Past Medical History:  ?Diagnosis Date  ? Allergy   ? Seasonal  ? Anxiety   ? Chicken pox   ? GERD (gastroesophageal reflux disease)   ? ?No past surgical history on file. ?Family History  ?Problem Relation Age of Onset  ? Arthritis Mother   ? Osteoporosis Mother   ? High Cholesterol Mother   ? Heart disease Father 55  ? Hypertension Father   ? Diabetes Father   ? Basal cell carcinoma Father   ? Cancer Maternal Grandmother 10  ?     Breast  ? Breast cancer Maternal Grandmother 70  ? Cancer Paternal Grandmother 45  ?     Colon  ? Lung cancer Paternal Grandfather   ? Melanoma Paternal Grandfather   ? Ovarian cancer Neg Hx   ? ? ?Allergies: Biaxin [clarithromycin] ?Current Outpatient Medications on File Prior to Visit  ?Medication Sig Dispense Refill  ? Acetaminophen (TYLENOL PO) Take by mouth.    ? Cholecalciferol (VITAMIN D3) 50 MCG (2000 UT) capsule Take 2,000 Units by mouth daily.    ? escitalopram (LEXAPRO) 10 MG tablet TAKE 1 TABLET BY MOUTH EVERY DAY 90 tablet 2  ? ferrous sulfate 325 (65 FE) MG EC tablet TAKE 1 TABLET BY MOUTH 2 TIMES DAILY WITH A MEAL. 180 tablet 1  ? fexofenadine (ALLEGRA) 60 MG tablet Take 60 mg by mouth daily.    ? Ibuprofen (ADVIL PO) Take by mouth.    ? Misc Natural Products (ELDERBERRY ZINC/VIT C/IMMUNE MT) Use as directed in the mouth or throat.    ? Omega 3 1000 MG CAPS Take 2,000 mg by mouth daily.    ? pantoprazole (PROTONIX) 20 MG tablet Take 1 tablet (20 mg total) by mouth every morning. Take 30 minutes to hour before breakfast 90 tablet 1  ? Probiotic Product (PROBIOTIC  ADVANCED PO) Take by mouth.    ? ?No current facility-administered medications on file prior to visit.  ? ? ?Social History  ? ?Tobacco Use  ? Smoking status: Never  ? Smokeless tobacco: Never  ?Substance Use Topics  ? Alcohol use: Yes  ?  Alcohol/week: 0.0 standard drinks  ?  Comment: 1 drink per week  ? Drug use: No  ? ? ?Review of Systems  ?Constitutional:  Negative for chills and fever.  ?Respiratory:  Negative for cough.   ?Cardiovascular:  Negative for chest pain and palpitations.  ?Gastrointestinal:  Negative for nausea and vomiting.  ?   ?Objective:  ?  ?BP 132/78 (BP Location: Left Arm, Patient Position: Sitting, Cuff Size: Large)   Pulse 98   Temp 97.8 ?F (36.6 ?C) (Temporal)   Ht '5\' 7"'$  (1.702 m)   Wt 229 lb 9.6 oz (104.1 kg)   LMP 01/26/2022 (Exact Date)   SpO2 97%   BMI 35.96 kg/m?  ?BP Readings from Last 3 Encounters:  ?02/09/22 132/78  ?02/04/22 122/74  ?12/17/21 118/82  ? ?Wt Readings from Last 3 Encounters:  ?02/09/22 229 lb 9.6 oz (104.1 kg)  ?02/04/22  228 lb 6.4 oz (103.6 kg)  ?12/17/21 230 lb 9.6 oz (104.6 kg)  ? ? ?Physical Exam ?Vitals reviewed.  ?Constitutional:   ?   Appearance: She is well-developed.  ?Eyes:  ?   Conjunctiva/sclera: Conjunctivae normal.  ?Cardiovascular:  ?   Rate and Rhythm: Normal rate and regular rhythm.  ?   Pulses: Normal pulses.  ?   Heart sounds: Normal heart sounds.  ?Pulmonary:  ?   Effort: Pulmonary effort is normal.  ?   Breath sounds: Normal breath sounds. No wheezing, rhonchi or rales.  ?Skin: ?   General: Skin is warm and dry.  ?Neurological:  ?   Mental Status: She is alert.  ?Psychiatric:     ?   Speech: Speech normal.     ?   Behavior: Behavior normal.     ?   Thought Content: Thought content normal.  ? ? ?   ?Assessment & Plan:  ? ?Problem List Items Addressed This Visit   ? ?  ? Endocrine  ? Diabetes mellitus without complication ([place]) - Primary  ?  Lab Results  ?Component Value Date  ? HGBA1C 6.5 02/04/2022  ?New diagnosis of diabetes.  Start  metformin 500 mg qpm.  Follow-up in 3 months time ?  ?  ? Relevant Medications  ? metFORMIN (GLUCOPHAGE-XR) 500 MG 24 hr tablet  ? rosuvastatin (CRESTOR) 5 MG tablet  ? Other Relevant Orders  ? Microalbumin / creatinine urine ratio (Completed)  ?  ? Other  ? HLD (hyperlipidemia)  ?  Uncontrolled.  Discussed LDL goal less than 70 particularly in the setting of diabetes.  She was amenable to starting Crestor 5 mg.  CMP in 6 weeks. ?  ?  ? Relevant Medications  ? rosuvastatin (CRESTOR) 5 MG tablet  ? Other Relevant Orders  ? Comprehensive metabolic panel  ? ? ? ?I am having Jaime Tate start on metFORMIN and rosuvastatin. I am also having Jaime Tate maintain Jaime Tate Ibuprofen (ADVIL PO), Acetaminophen (TYLENOL PO), fexofenadine, Vitamin D3, Omega 3, Probiotic Product (PROBIOTIC ADVANCED PO), Misc Natural Products (ELDERBERRY ZINC/VIT C/IMMUNE MT), ferrous sulfate, pantoprazole, and escitalopram. ? ? ?Meds ordered this encounter  ?Medications  ? metFORMIN (GLUCOPHAGE-XR) 500 MG 24 hr tablet  ?  Sig: Take 1 tablet (500 mg total) by mouth every evening.  ?  Dispense:  90 tablet  ?  Refill:  2  ?  Order Specific Question:   Supervising Provider  ?  Answer:   Crecencio Mc [[id number]]  ? rosuvastatin (CRESTOR) 5 MG tablet  ?  Sig: Take 1 tablet (5 mg total) by mouth every evening.  ?  Dispense:  90 tablet  ?  Refill:  3  ?  Order Specific Question:   Supervising Provider  ?  Answer:   Crecencio Mc [[id number]]  ? ? ?Return precautions given.  ? ?Risks, benefits, and alternatives of the medications and treatment plan prescribed today were discussed, and patient expressed understanding.  ? ?Education regarding symptom management and diagnosis given to patient on AVS. ? ?Continue to follow with Burnard Hawthorne, FNP for routine health maintenance.  ? ?Merlean Pizzini Ostrovsky and I agreed with plan.  ? ?Mable Paris, FNP ? ? ?

## 2022-02-09 NOTE — Telephone Encounter (Signed)
-----   Message from Storm Frisk, [place] sent at 02/09/2022  3:07 PM EST ----- ?Hi Jaime Tate, ? ?She will need to take 1/2 of the usual dose of oral diabetes medication the night before procedure. Hold Morning Oral Diabetes medications morning of procedure. Just make sure to drink plenty of water. Crestor she will take after the procedure. ? ?Thank you! ?Jovon ?----- Message ----- ?From: Burnard Hawthorne, FNP ?Sent: 02/09/2022   3:05 PM EST ?To: Storm Frisk, CMA ? ?Hi Jovon,  ? ?Jaime Tate was in my office today and she is scheduled for colonoscopy 04/2022.  ? ?She is recently diagnosed with diabetes and I am starting her on crestor and metformin.  ? ?Please let me know and reach out to patient please if the above medications need to be held prior to colonoscopy. ? ?Thank you! ? ?Jaime Schmid, Jaime Tate ? ? ? ? ?

## 2022-02-09 NOTE — Patient Instructions (Signed)
I have sent in metformin and Crestor for you to start.  I have also sent a message to gastroenterology to let them know that you were newly diagnosed with diabetes and starting new medications.  Please let me know if you not hear from them as it relates to holding metformin or Crestor had a colonoscopy though I doubt that would be the case. ?

## 2022-02-10 LAB — MICROALBUMIN / CREATININE URINE RATIO
Creatinine,U: 64.1 mg/dL
Microalb Creat Ratio: 1.1 mg/g (ref 0.0–30.0)
Microalb, Ur: <0.7 mg/dL (ref 0.0–1.9)

## 2022-02-10 NOTE — Telephone Encounter (Signed)
Spoke to patient about notes about her upcoming colonoscopy. She verbalized understanding and will proceed accordingly. ?

## 2022-02-11 NOTE — Assessment & Plan Note (Signed)
Lab Results  ?Component Value Date  ? HGBA1C 6.5 02/04/2022  ? ?New diagnosis of diabetes.  Start metformin 500 mg qpm.  Follow-up in 3 months time ?

## 2022-02-11 NOTE — Assessment & Plan Note (Signed)
Uncontrolled.  Discussed LDL goal less than 70 particularly in the setting of diabetes.  She was amenable to starting Crestor 5 mg.  CMP in 6 weeks. ?

## 2022-02-11 NOTE — Telephone Encounter (Signed)
Spoke to patient she verbalized understanding.

## 2022-03-08 NOTE — Progress Notes (Signed)
? ?NEUROLOGY CONSULTATION NOTE ? ?Jaime Tate ?MRN: [id number] ?DOB: 04/23/1976 ? ?Referring provider: Dutch Quint, FNP ?Primary care provider: Mable Paris, FNP ? ?Reason for consult:  abnormal MRA of head ? ?Assessment/Plan:  ? ?Left sided pulsatile tinnitus - No intracranial/extracranial vascular abnormality or thyroid disease to explain symptoms.  MRI shows partially empty sella which Buske suggest idiopathic intracranial hypertension but this is often an incidental finding and she does not exhibit other features of IIH such as headache or visual obscurations. ?She does exhibit symptoms concerning for sleep apnea (morning headache, snores, daytime sleepiness).  This Stanbrough be a cause for increased intracranial pressure and tinnitus. ? ?1  As she doesn't have any concerning symptoms such as severe headache and visual disturbance, plan is to continue with weight loss. ?2  I will refer her to sleep medicine for evaluation of sleep apnea ?3  She will follow up in 5 months.  If no resolution of symptoms, we Hartl consider lumbar puncture to assess opening pressure.   ? ? ?Subjective:  ?Jaime Tate is a 46 year old left-handed female with GERD and anxiety who presents for abnormal brain MRA.  History supplemented by internal medicine notes. ? ?When change in position - turning head, getting up or laying down on left  ?Since early 2022, she has been experiencing intermittent pulsating sensation and whooshing sound in her left ear.  It occurs if she turns her head quickly, stands up, lays down or laying on her left side.  With change in position it lasts quickly.  It will last as long as she is laying on her left side.  No associated ear pain, ringing in the ear, hearing changes or vertigo.  MRI of brain with and without contrast and MRA of head performed on 12/20/2021 personally reviewed showed partially empty sella but otherwise unremarkable.  Carotid ultrasound on 02/03/2022 was normal.  TSH on 02/04/2022 was 2.10.   She saw ophthalmology and ENT in February with unremarkable exams.  Around the same time symptoms started, she needed new glasses but denies visual obscurations.  She Previti wake up with a mild frontal pressure headache a couple of times a week but no significant headaches.  She reportedly snores and feels fatigued during the day.  During the pandemic, she gained about 15 lbs.  About a month ago, she was diagnosed with diabetes.  She has since lost about 7 lbs and notes that the tinnitus doesn't sound as loud.   ?  ? ? ?PAST MEDICAL HISTORY: ?Past Medical History:  ?Diagnosis Date  ? Allergy   ? Seasonal  ? Anxiety   ? Chicken pox   ? GERD (gastroesophageal reflux disease)   ? ? ?PAST SURGICAL HISTORY: ?No past surgical history on file. ? ?MEDICATIONS: ?Current Outpatient Medications on File Prior to Visit  ?Medication Sig Dispense Refill  ? Acetaminophen (TYLENOL PO) Take by mouth.    ? Cholecalciferol (VITAMIN D3) 50 MCG (2000 UT) capsule Take 2,000 Units by mouth daily.    ? escitalopram (LEXAPRO) 10 MG tablet TAKE 1 TABLET BY MOUTH EVERY DAY 90 tablet 2  ? ferrous sulfate 325 (65 FE) MG EC tablet TAKE 1 TABLET BY MOUTH 2 TIMES DAILY WITH A MEAL. 180 tablet 1  ? fexofenadine (ALLEGRA) 60 MG tablet Take 60 mg by mouth daily.    ? Ibuprofen (ADVIL PO) Take by mouth.    ? metFORMIN (GLUCOPHAGE-XR) 500 MG 24 hr tablet Take 1 tablet (500 mg total) by mouth  every evening. 90 tablet 2  ? Misc Natural Products (ELDERBERRY ZINC/VIT C/IMMUNE MT) Use as directed in the mouth or throat.    ? Omega 3 1000 MG CAPS Take 2,000 mg by mouth daily.    ? pantoprazole (PROTONIX) 20 MG tablet Take 1 tablet (20 mg total) by mouth every morning. Take 30 minutes to hour before breakfast 90 tablet 1  ? Probiotic Product (PROBIOTIC ADVANCED PO) Take by mouth.    ? rosuvastatin (CRESTOR) 5 MG tablet Take 1 tablet (5 mg total) by mouth every evening. 90 tablet 3  ? ?No current facility-administered medications on file prior to visit.   ? ? ?ALLERGIES: ?Allergies  ?Allergen Reactions  ? Biaxin [Clarithromycin] Diarrhea and Nausea And Vomiting  ? ? ?FAMILY HISTORY: ?Family History  ?Problem Relation Age of Onset  ? Arthritis Mother   ? Osteoporosis Mother   ? High Cholesterol Mother   ? Heart disease Father 15  ? Hypertension Father   ? Diabetes Father   ? Basal cell carcinoma Father   ? Cancer Maternal Grandmother 24  ?     Breast  ? Breast cancer Maternal Grandmother 64  ? Cancer Paternal Grandmother 24  ?     Colon  ? Lung cancer Paternal Grandfather   ? Melanoma Paternal Grandfather   ? Ovarian cancer Neg Hx   ? ? ?Objective:  ?Blood pressure 119/77, pulse 86, height '5\' 4"'$  (1.626 m), weight 222 lb 6.4 oz (100.9 kg), SpO2 95 %. ?General: No acute distress.  Patient appears well-groomed.   ?Head:  Normocephalic/atraumatic, mild bilateral suboccipital tenderness to palpation ?Eyes:  fundi examined but not visualized ?Neck: supple, no paraspinal tenderness, full range of motion ?Heart: regular rate and rhythm ?Neurological Exam: ?Mental status: alert and oriented to person, place, and time, recent and remote memory intact, fund of knowledge intact, attention and concentration intact, speech fluent and not dysarthric, language intact. ?Cranial nerves: ?CN I: not tested ?CN II: pupils equal, round and reactive to light, visual fields intact ?CN III, IV, VI:  full range of motion, no nystagmus, no ptosis ?CN V: facial sensation intact. ?CN VII: upper and lower face symmetric ?CN VIII: hearing intact ?CN IX, X: gag intact, uvula midline ?CN XI: sternocleidomastoid and trapezius muscles intact ?CN XII: tongue midline ?Bulk & Tone: normal, no fasciculations. ?Motor:  muscle strength 5/5 throughout ?Sensation:  Pinprick, temperature and vibratory sensation intact. ?Deep Tendon Reflexes:  2+ throughout,  toes downgoing.   ?Finger to nose testing:  Without dysmetria.   ?Heel to shin:  Without dysmetria.   ?Gait:  Normal station and stride.  Romberg  negative. ? ? ? ?Thank you for allowing me to take part in the care of this patient. ? ?Metta Clines, DO ? ?CC:  Dutch Quint, FNP ? Mable Paris, FNP ? ? ? ? ?

## 2022-03-09 ENCOUNTER — Encounter: Payer: Self-pay | Admitting: Neurology

## 2022-03-09 ENCOUNTER — Ambulatory Visit (INDEPENDENT_AMBULATORY_CARE_PROVIDER_SITE_OTHER): Payer: BC Managed Care – PPO | Admitting: Neurology

## 2022-03-09 VITALS — BP 119/77 | HR 86 | Ht 64.0 in | Wt 222.4 lb

## 2022-03-09 DIAGNOSIS — R0683 Snoring: Secondary | ICD-10-CM | POA: Diagnosis not present

## 2022-03-09 DIAGNOSIS — H93A2 Pulsatile tinnitus, left ear: Secondary | ICD-10-CM | POA: Diagnosis not present

## 2022-03-09 DIAGNOSIS — E236 Other disorders of pituitary gland: Secondary | ICD-10-CM | POA: Diagnosis not present

## 2022-03-09 DIAGNOSIS — G4719 Other hypersomnia: Secondary | ICD-10-CM

## 2022-03-09 DIAGNOSIS — R519 Headache, unspecified: Secondary | ICD-10-CM

## 2022-03-09 NOTE — Patient Instructions (Signed)
I would like to refer you to sleep medicine for evaluation of sleep apnea ?In the meantime, continue trying to lose weight ?Follow up in 5 months.  If there is no resolution we can always consider a spinal tap to evaluate for increased intracranial pressure ?

## 2022-03-23 ENCOUNTER — Other Ambulatory Visit: Payer: BC Managed Care – PPO

## 2022-03-24 ENCOUNTER — Other Ambulatory Visit (INDEPENDENT_AMBULATORY_CARE_PROVIDER_SITE_OTHER): Payer: BC Managed Care – PPO

## 2022-03-24 DIAGNOSIS — E785 Hyperlipidemia, unspecified: Secondary | ICD-10-CM

## 2022-03-24 LAB — COMPREHENSIVE METABOLIC PANEL
ALT: 13 U/L (ref 0–35)
AST: 13 U/L (ref 0–37)
Albumin: 4.4 g/dL (ref 3.5–5.2)
Alkaline Phosphatase: 83 U/L (ref 39–117)
BUN: 10 mg/dL (ref 6–23)
CO2: 24 mEq/L (ref 19–32)
Calcium: 9.6 mg/dL (ref 8.4–10.5)
Chloride: 104 mEq/L (ref 96–112)
Creatinine, Ser: 0.73 mg/dL (ref 0.40–1.20)
GFR: 99.37 mL/min (ref >60.00)
Glucose, Bld: 113 mg/dL — ABNORMAL HIGH (ref 70–99)
Potassium: 4.1 mEq/L (ref 3.5–5.1)
Sodium: 138 mEq/L (ref 135–145)
Total Bilirubin: 0.4 mg/dL (ref 0.2–1.2)
Total Protein: 7.1 g/dL (ref 6.0–8.3)

## 2022-04-04 IMAGING — MG DIGITAL SCREENING BILAT W/ TOMO W/ CAD
8 series · 8 of 24 positions shown · non-contrast
Comparison: Previous exam(s).

CLINICAL DATA: Screening.

EXAM:
DIGITAL SCREENING BILATERAL MAMMOGRAM WITH TOMO AND CAD

[L MLO synth-2D]
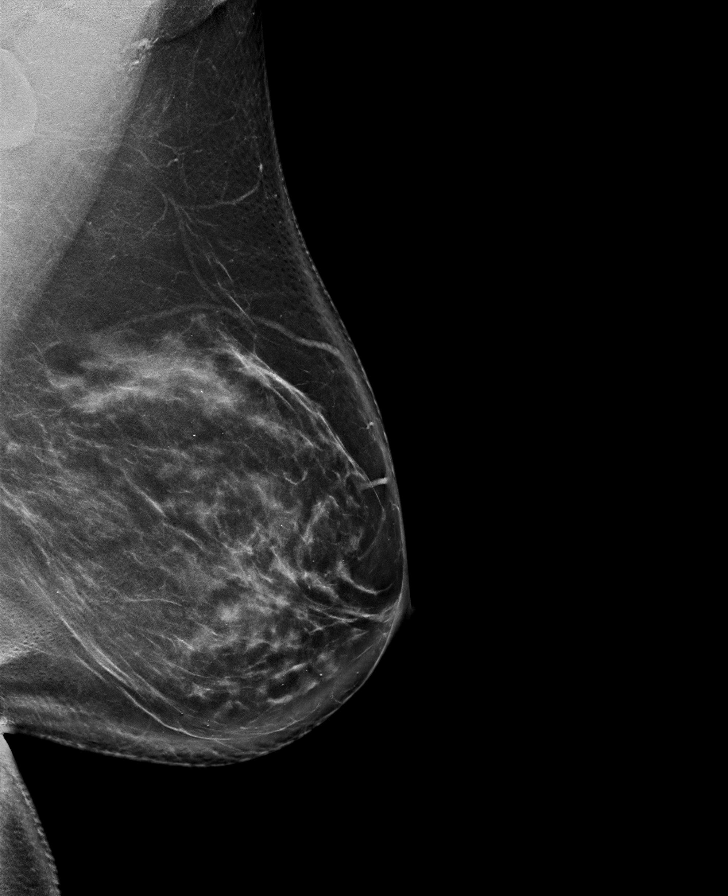

[R MLO synth-2D]
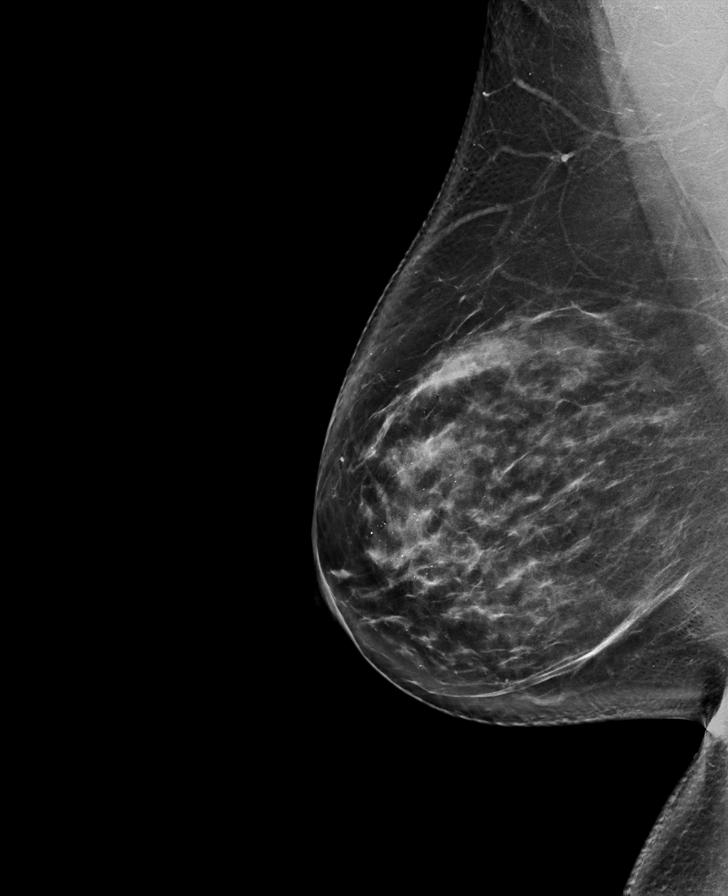

[L CC synth-2D]
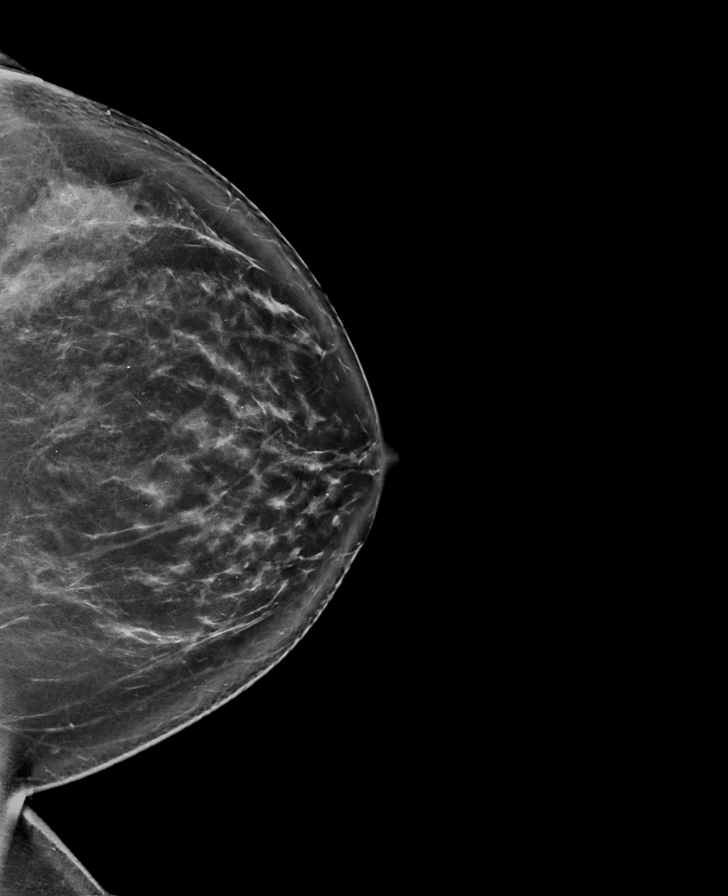

[R CC synth-2D]
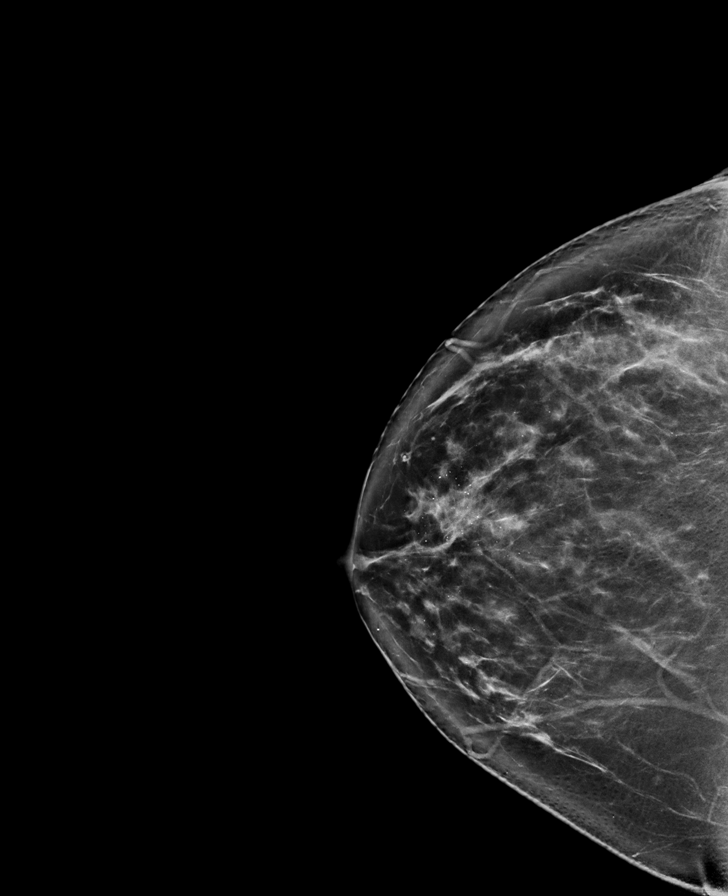

[L CC tomo · tomo slice 47/94.0]
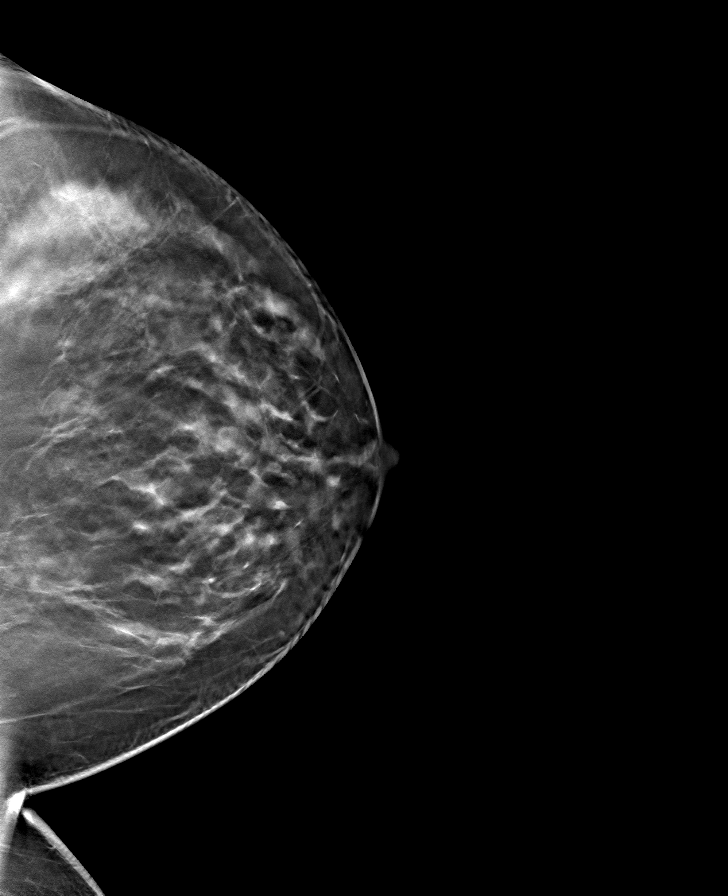

[R MLO tomo · tomo slice 51/100.0]
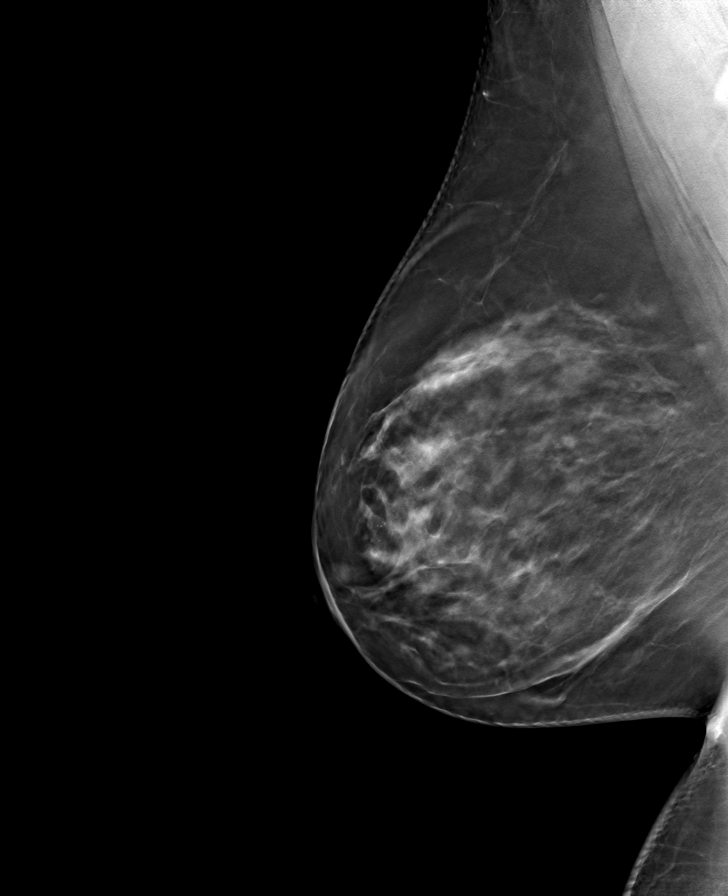

[L MLO tomo · tomo slice 53/105.0]
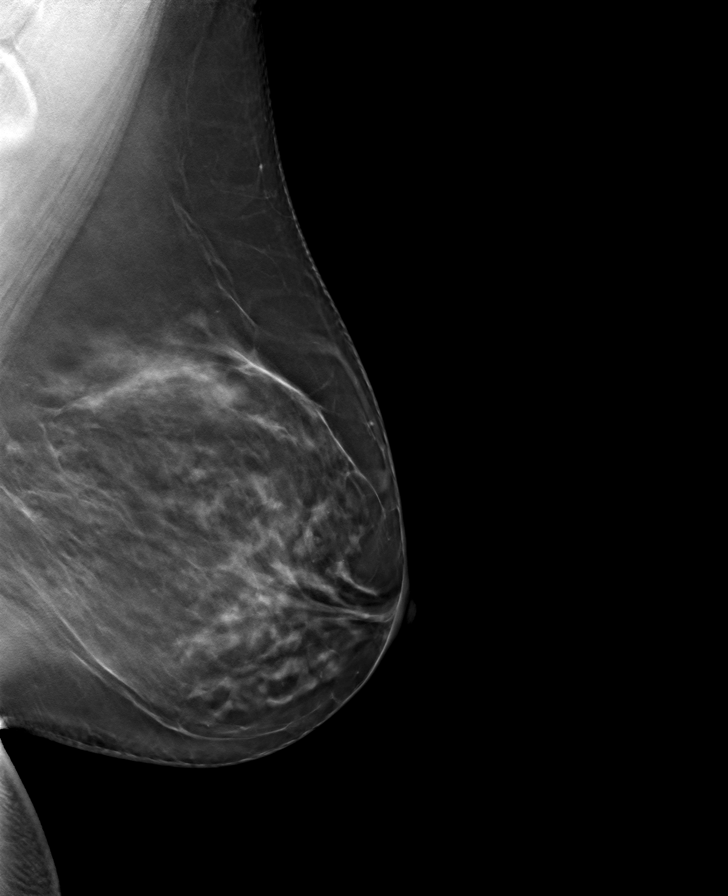

[R CC tomo · tomo slice 47/93.0]
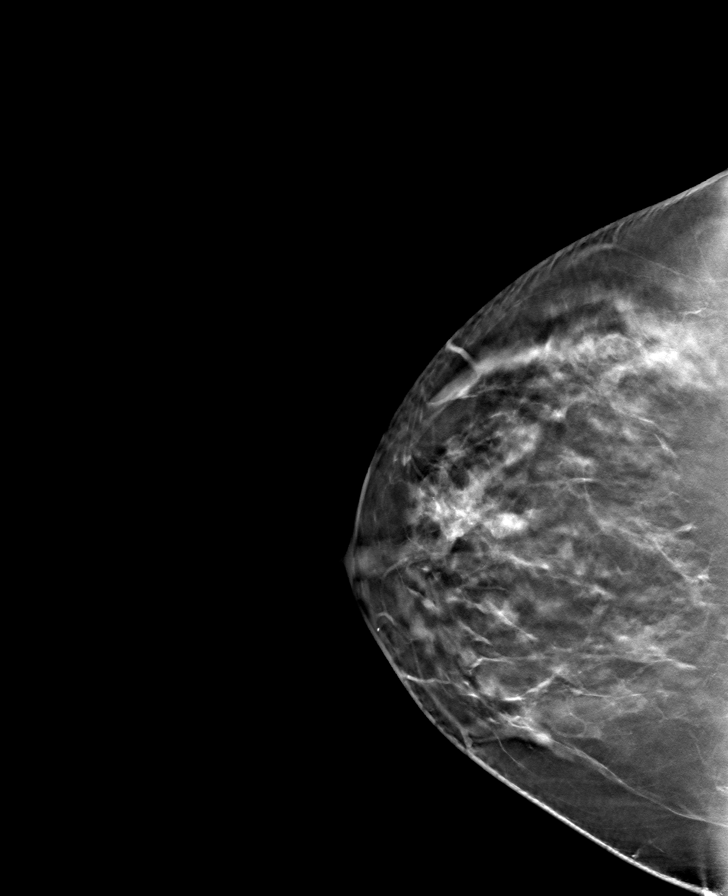

[8 of 24 positions shown; findings below may reference images not displayed]

ACR Breast Density Category c: The breast tissue is heterogeneously
dense, which may obscure small masses.
FINDINGS: There are no findings suspicious for malignancy. Images were
processed with CAD.
IMPRESSION: No mammographic evidence of malignancy. A result letter of this
screening mammogram will be mailed directly to the patient.

RECOMMENDATION:
Screening mammogram in one year. (Code:FT-U-LHB)

BI-RADS CATEGORY  1: Negative.

## 2022-04-05 ENCOUNTER — Encounter: Payer: Self-pay | Admitting: Gastroenterology

## 2022-04-05 ENCOUNTER — Ambulatory Visit: Payer: BC Managed Care – PPO | Admitting: Certified Registered"

## 2022-04-05 ENCOUNTER — Ambulatory Visit
Admission: RE | Admit: 2022-04-05 | Discharge: 2022-04-05 | Disposition: A | Discharge status: Home / Self Care | Payer: BC Managed Care – PPO | Attending: Gastroenterology | Admitting: Gastroenterology

## 2022-04-05 ENCOUNTER — Encounter
Admission: RE | Disposition: A | Discharge status: Home / Self Care | Payer: Self-pay | Source: Home / Self Care | Attending: Gastroenterology

## 2022-04-05 DIAGNOSIS — Z1211 Encounter for screening for malignant neoplasm of colon: Secondary | ICD-10-CM | POA: Insufficient documentation

## 2022-04-05 DIAGNOSIS — E119 Type 2 diabetes mellitus without complications: Secondary | ICD-10-CM | POA: Insufficient documentation

## 2022-04-05 DIAGNOSIS — K640 First degree hemorrhoids: Secondary | ICD-10-CM | POA: Diagnosis not present

## 2022-04-05 DIAGNOSIS — E785 Hyperlipidemia, unspecified: Secondary | ICD-10-CM | POA: Diagnosis not present

## 2022-04-05 DIAGNOSIS — K219 Gastro-esophageal reflux disease without esophagitis: Secondary | ICD-10-CM | POA: Insufficient documentation

## 2022-04-05 HISTORY — PX: COLONOSCOPY WITH PROPOFOL: SHX5780

## 2022-04-05 LAB — GLUCOSE, CAPILLARY: Glucose-Capillary: 117 mg/dL — ABNORMAL HIGH (ref 70–99)

## 2022-04-05 LAB — POCT PREGNANCY, URINE: Preg Test, Ur: NEGATIVE

## 2022-04-05 SURGERY — COLONOSCOPY WITH PROPOFOL
Anesthesia: General

## 2022-04-05 MED ORDER — PROPOFOL 10 MG/ML IV BOLUS
INTRAVENOUS | Status: DC | PRN
  Administered 2022-04-05: 100 mg via INTRAVENOUS

## 2022-04-05 MED ORDER — LIDOCAINE HCL (CARDIAC) PF 100 MG/5ML IV SOSY
PREFILLED_SYRINGE | INTRAVENOUS | Status: DC | PRN
Start: 2022-04-05 — End: 2022-04-05
  Administered 2022-04-05: 100 mg via INTRAVENOUS

## 2022-04-05 MED ORDER — PROPOFOL 500 MG/50ML IV EMUL
INTRAVENOUS | Status: DC | PRN
  Administered 2022-04-05: 150 ug/kg/min via INTRAVENOUS

## 2022-04-05 MED ORDER — PROPOFOL 500 MG/50ML IV EMUL
INTRAVENOUS | Status: AC
  Filled 2022-04-05: qty 50

## 2022-04-05 MED ORDER — DEXMEDETOMIDINE HCL IN NACL 200 MCG/50ML IV SOLN
INTRAVENOUS | Status: DC | PRN
  Administered 2022-04-05: 12 ug via INTRAVENOUS

## 2022-04-05 MED ORDER — SODIUM CHLORIDE 0.9 % IV SOLN: INTRAVENOUS | Status: DC

## 2022-04-05 NOTE — Transfer of Care (Signed)
Immediate Anesthesia Transfer of Care Note ? ?Patient: Jaime Tate ? ?Procedure(s) Performed: COLONOSCOPY WITH PROPOFOL ? ?Patient Location: PACU and Endoscopy Unit ? ?Anesthesia Type:General ? ?Level of Consciousness: awake ? ?Airway & Oxygen Therapy: Patient Spontanous Breathing ? ?Post-op Assessment: Report given to RN ? ?Post vital signs: stable ? ?Last Vitals:  ?Vitals Value Taken Time  ?BP    ?Temp    ?Pulse    ?Resp    ?SpO2    ? ? ?Last Pain:  ?Vitals:  ? 04/05/22 0706  ?TempSrc: Temporal  ?PainSc: 0-No pain  ?   ? ?  ? ?Complications: No notable events documented. ?

## 2022-04-05 NOTE — Anesthesia Postprocedure Evaluation (Signed)
Anesthesia Post Note ? ?Patient: [name] ? ?Procedure(s) Performed: COLONOSCOPY WITH PROPOFOL ? ?Patient location during evaluation: Endoscopy ?Anesthesia Type: General ?Level of consciousness: awake and alert ?Pain management: pain level controlled ?Vital Signs Assessment: post-procedure vital signs reviewed and stable ?Respiratory status: spontaneous breathing, nonlabored ventilation, respiratory function stable and patient connected to nasal cannula oxygen ?Cardiovascular status: blood pressure returned to baseline and stable ?Postop Assessment: no apparent nausea or vomiting ?Anesthetic complications: no ? ? ?No notable events documented. ? ? ?Last Vitals:  ?Vitals:  ? 04/05/22 0809 04/05/22 0819  ?BP: 98/65 99/65  ?Pulse: 75 (!) 55  ?Resp: (!) 21 18  ?Temp: (!) 35.9 ?C   ?SpO2: 96% 96%  ?  ?Last Pain:  ?Vitals:  ? 04/05/22 0819  ?TempSrc:   ?PainSc: 0-No pain  ? ? ?  ?  ?  ?  ?  ?  ? ?Precious Haws [place] Delio ? ? ? ? ?

## 2022-04-05 NOTE — Anesthesia Preprocedure Evaluation (Signed)
Anesthesia Evaluation  ?Patient identified by MRN, date of birth, ID band ?Patient awake ? ? ? ?Reviewed: ?Allergy & Precautions, NPO status , Patient's Chart, lab work & pertinent test results ? ?History of Anesthesia Complications ?Negative for: history of anesthetic complications ? ?Airway ?Mallampati: III ? ?TM Distance: <3 FB ?Neck ROM: full ? ? ? Dental ? ?(+) Chipped ?  ?Pulmonary ?neg pulmonary ROS, neg shortness of breath,  ?  ?Pulmonary exam normal ? ? ? ? ? ? ? Cardiovascular ?Exercise Tolerance: Good ?(-) angina(-) Past MI negative cardio ROS ?Normal cardiovascular exam ? ? ?  ?Neuro/Psych ?PSYCHIATRIC DISORDERS  Neuromuscular disease   ? GI/Hepatic ?Neg liver ROS, GERD  Controlled,  ?Endo/Other  ?diabetes, Type 2 ? Renal/GU ?negative Renal ROS  ?negative genitourinary ?  ?Musculoskeletal ? ? Abdominal ?  ?Peds ? Hematology ?negative hematology ROS ?(+)   ?Anesthesia Other Findings ?Past Medical History: ?No date: Allergy ?    Comment:  Seasonal ?No date: Anxiety ?No date: Chicken pox ?No date: GERD (gastroesophageal reflux disease) ? ?Past Surgical History: ?No date: ESOPHAGOGASTRODUODENOSCOPY (EGD) WITH PROPOFOL ? ?BMI   ? Body Mass Index: 37.76 kg/m?  ?  ? ? Reproductive/Obstetrics ?negative OB ROS ? ?  ? ? ? ? ? ? ? ? ? ? ? ? ? ?  ?  ? ? ? ? ? ? ? ? ?Anesthesia Physical ?Anesthesia Plan ? ?ASA: 3 ? ?Anesthesia Plan: General  ? ?Post-op Pain Management:   ? ?Induction: Intravenous ? ?PONV Risk Score and Plan: Propofol infusion and TIVA ? ?Airway Management Planned: Natural Airway and Nasal Cannula ? ?Additional Equipment:  ? ?Intra-op Plan:  ? ?Post-operative Plan:  ? ?Informed Consent: I have reviewed the patients History and Physical, chart, labs and discussed the procedure including the risks, benefits and alternatives for the proposed anesthesia with the patient or authorized representative who has indicated his/her understanding and acceptance.  ? ? ? ?Dental  Advisory Given ? ?Plan Discussed with: Anesthesiologist, CRNA and Surgeon ? ?Anesthesia Plan Comments: (Patient consented for risks of anesthesia including but not limited to:  ?- adverse reactions to medications ?- risk of airway placement if required ?- damage to eyes, teeth, lips or other oral mucosa ?- nerve damage due to positioning  ?- sore throat or hoarseness ?- Damage to heart, brain, nerves, lungs, other parts of body or loss of life ? ?Patient voiced understanding.)  ? ? ? ? ? ? ?Anesthesia Quick Evaluation ? ?

## 2022-04-05 NOTE — H&P (Signed)
? ?Lucilla Lame, MD [facility] ?[address] ?[place], [place] [address] ?Phone: [phone] ?Fax : [phone] ? ?Primary Care Physician:  Burnard Hawthorne, FNP ?Primary Gastroenterologist:  Dr. Allen Norris ? ?Pre-Procedure History & Physical: ?HPI:  Jaime Tate is a 46 y.o. female is here for a screening colonoscopy.  ? ?Past Medical History:  ?Diagnosis Date  ? Allergy   ? Seasonal  ? Anxiety   ? Chicken pox   ? GERD (gastroesophageal reflux disease)   ? ? ?Past Surgical History:  ?Procedure Laterality Date  ? ESOPHAGOGASTRODUODENOSCOPY (EGD) WITH PROPOFOL    ? ? ?Prior to Admission medications   ?Medication Sig Start Date End Date Taking? Authorizing Provider  ?pantoprazole (PROTONIX) 20 MG tablet Take 1 tablet (20 mg total) by mouth every morning. Take 30 minutes to hour before breakfast 10/26/21  Yes Arnett, Yvetta Coder, FNP  ?rosuvastatin (CRESTOR) 5 MG tablet Take 1 tablet (5 mg total) by mouth every evening. 02/09/22  Yes Burnard Hawthorne, FNP  ?Acetaminophen (TYLENOL PO) Take by mouth.    [provider]  ?Cholecalciferol (VITAMIN D3) 50 MCG (2000 UT) capsule Take 2,000 Units by mouth daily.    [provider]  ?escitalopram (LEXAPRO) 10 MG tablet TAKE 1 TABLET BY MOUTH EVERY DAY 12/29/21   Crecencio Mc, MD  ?ferrous sulfate 325 (65 FE) MG EC tablet TAKE 1 TABLET BY MOUTH 2 TIMES DAILY WITH A MEAL. 09/20/21   Burnard Hawthorne, FNP  ?fexofenadine (ALLEGRA) 60 MG tablet Take 60 mg by mouth daily.    [provider]  ?Ibuprofen (ADVIL PO) Take by mouth.    [provider]  ?metFORMIN (GLUCOPHAGE-XR) 500 MG 24 hr tablet Take 1 tablet (500 mg total) by mouth every evening. 02/09/22   Burnard Hawthorne, FNP  ?Misc Natural Products (ELDERBERRY ZINC/VIT C/IMMUNE MT) Use as directed in the mouth or throat.    [provider]  ?Omega 3 1000 MG CAPS Take 2,000 mg by mouth daily.    [provider]  ?Probiotic Product (PROBIOTIC ADVANCED PO) Take by mouth.     [provider]  ? ? ?Allergies as of 02/07/2022 - Review Complete 02/04/2022  ?Allergen Reaction Noted  ? Biaxin [clarithromycin] Diarrhea and Nausea And Vomiting 06/11/2015  ? ? ?Family History  ?Problem Relation Age of Onset  ? Arthritis Mother   ? Osteoporosis Mother   ? High Cholesterol Mother   ? Heart disease Father 67  ? Hypertension Father   ? Diabetes Father   ? Basal cell carcinoma Father   ? Cancer Maternal Grandmother 25  ?     Breast  ? Breast cancer Maternal Grandmother 56  ? Cancer Paternal Grandmother 31  ?     Colon  ? Lung cancer Paternal Grandfather   ? Melanoma Paternal Grandfather   ? Ovarian cancer Neg Hx   ? ? ?Social History  ? ?Socioeconomic History  ? Marital status: Married  ?  Spouse name: Not on file  ? Number of children: Not on file  ? Years of education: Not on file  ? Highest education level: Not on file  ?Occupational History  ? Not on file  ?Tobacco Use  ? Smoking status: Never  ? Smokeless tobacco: Never  ?Vaping Use  ? Vaping Use: Never used  ?Substance and Sexual Activity  ? Alcohol use: Yes  ?  Alcohol/week: 0.0 standard drinks  ?  Comment: 1 drink per week  ? Drug use: No  ?  Sexual activity: Yes  ?  Partners: Male  ?  Birth control/protection: Surgical  ?  Comment: Husband- Vasectomy  ?Other Topics Concern  ? Not on file  ?Social History Narrative  ? Works for [employer]   ? Lives with husband and 1 son (14 yo in August)   ? Pets: Cat   ? Coffee- 1 cup coffee, 1 occasional unsweet tea, no soda   ? Highest level education- Masters degree  ? Enjoys read, Netflix, and cross stitch   ? ?Social Determinants of Health  ? ?Financial Resource Strain: Not on file  ?Food Insecurity: Not on file  ?Transportation Needs: Not on file  ?Physical Activity: Not on file  ?Stress: Not on file  ?Social Connections: Not on file  ?Intimate Partner Violence: Not on file  ? ? ?Review of Systems: ?See HPI, otherwise negative ROS ? ?Physical Exam: ?BP 121/74   Pulse 71    Temp (!) 96.8 ?F (36 ?C) (Temporal)   Resp 18   Ht '5\' 4"'$  (1.626 m)   Wt 99.8 kg   SpO2 99%   BMI 37.76 kg/m?  ?General:   Alert,  pleasant and cooperative in NAD ?Head:  Normocephalic and atraumatic. ?Neck:  Supple; no masses or thyromegaly. ?Lungs:  Clear throughout to auscultation.    ?Heart:  Regular rate and rhythm. ?Abdomen:  Soft, nontender and nondistended. Normal bowel sounds, without guarding, and without rebound.   ?Neurologic:  Alert and  oriented x4;  grossly normal neurologically. ? ?Impression/Plan: ?Jaime Tate is now here to undergo a screening colonoscopy. ? ?Risks, benefits, and alternatives regarding colonoscopy have been reviewed with the patient.  Questions have been answered.  All parties agreeable. ?

## 2022-04-05 NOTE — Op Note (Signed)
[facility] ?Gastroenterology ?Patient Name: Jaime Tate ?Procedure Date: 04/05/2022 7:35 AM ?MRN: [id number] ?Account #: [id number] ?Date of Birth: [birth year] ?Admit Type: Outpatient ?Age: 46 ?Room: [facility] ENDO ROOM 4 ?Gender: Female ?Note Status: Finalized ?Instrument Name: Colonscope [id number] ?Procedure:             Colonoscopy ?Indications:           Screening for colorectal malignant neoplasm ?Providers:             Lucilla Lame MD, MD ?Referring MD:          Forest Gleason Md, MD (Referring MD) ?Medicines:             Propofol per Anesthesia ?Complications:         No immediate complications. ?Procedure:             Pre-Anesthesia Assessment: ?                       - Prior to the procedure, a History and Physical was  ?                       performed, and patient medications and allergies were  ?                       reviewed. The patient's tolerance of previous  ?                       anesthesia was also reviewed. The risks and benefits  ?                       of the procedure and the sedation options and risks  ?                       were discussed with the patient. All questions were  ?                       answered, and informed consent was obtained. Prior  ?                       Anticoagulants: The patient has taken no previous  ?                       anticoagulant or antiplatelet agents. ASA Grade  ?                       Assessment: II - A patient with mild systemic disease.  ?                       After reviewing the risks and benefits, the patient  ?                       was deemed in satisfactory condition to undergo the  ?                       procedure. ?                       After obtaining informed consent, the colonoscope was  ?  passed under direct vision. Throughout the procedure,  ?                       the patient's blood pressure, pulse, and oxygen  ?                       saturations were monitored continuously. The  ?                        Colonoscope was introduced through the anus and  ?                       advanced to the the cecum, identified by appendiceal  ?                       orifice and ileocecal valve. The colonoscopy was  ?                       performed without difficulty. The patient tolerated  ?                       the procedure well. The quality of the bowel  ?                       preparation was excellent. ?Findings: ?     The perianal and digital rectal examinations were normal. ?     Non-bleeding internal hemorrhoids were found during retroflexion. The  ?     hemorrhoids were Grade I (internal hemorrhoids that do not prolapse). ?     The exam was otherwise without abnormality. ?Impression:            - Non-bleeding internal hemorrhoids. ?                       - The examination was otherwise normal. ?                       - No specimens collected. ?Recommendation:        - Discharge patient to home. ?                       - Resume previous diet. ?                       - Continue present medications. ?                       - Repeat colonoscopy in 10 years for screening  ?                       purposes. ?Procedure Code(s):     --- Professional --- ?                       [phone], Colonoscopy, flexible; diagnostic, including  ?                       collection of specimen(s) by brushing or washing, when  ?                       performed (separate procedure) ?Diagnosis Code(s):     ---  Professional --- ?                       Z12.11, Encounter for screening for malignant neoplasm  ?                       of colon ?CPT copyright 2019 American Medical Association. All rights reserved. ?The codes documented in this report are preliminary and upon coder review [facility]  ?be revised to meet current compliance requirements. ?Lucilla Lame MD, MD ?04/05/2022 7:56:43 AM ?This report has been signed electronically. ?Number of Addenda: 0 ?Note Initiated On: 04/05/2022 7:35 AM ?Scope Withdrawal Time: 0 hours 10 minutes 37 seconds  ?Total  Procedure Duration: 0 hours 13 minutes 58 seconds  ?Estimated Blood Loss:  Estimated blood loss: none. ?     [facility] ?

## 2022-04-06 ENCOUNTER — Encounter: Payer: Self-pay | Admitting: Gastroenterology

## 2022-04-15 ENCOUNTER — Other Ambulatory Visit: Payer: Self-pay | Admitting: Family

## 2022-04-15 DIAGNOSIS — K2100 Gastro-esophageal reflux disease with esophagitis, without bleeding: Secondary | ICD-10-CM

## 2022-04-25 ENCOUNTER — Institutional Professional Consult (permissible substitution): Payer: BC Managed Care – PPO | Admitting: Primary Care

## 2022-05-16 ENCOUNTER — Ambulatory Visit: Payer: BC Managed Care – PPO | Admitting: Family

## 2022-05-16 ENCOUNTER — Encounter: Payer: Self-pay | Admitting: Family

## 2022-05-16 VITALS — BP 128/72 | HR 82 | Temp 98.6°F | Ht 63.0 in | Wt 216.8 lb

## 2022-05-16 DIAGNOSIS — Z1231 Encounter for screening mammogram for malignant neoplasm of breast: Secondary | ICD-10-CM | POA: Diagnosis not present

## 2022-05-16 DIAGNOSIS — E785 Hyperlipidemia, unspecified: Secondary | ICD-10-CM | POA: Diagnosis not present

## 2022-05-16 DIAGNOSIS — E119 Type 2 diabetes mellitus without complications: Secondary | ICD-10-CM | POA: Diagnosis not present

## 2022-05-16 LAB — POCT GLYCOSYLATED HEMOGLOBIN (HGB A1C): Hemoglobin A1C: 6 % — AB (ref 4.0–5.6)

## 2022-05-16 NOTE — Assessment & Plan Note (Signed)
Lab Results  Component Value Date   HGBA1C 6.0 (A) 05/16/2022   Excellent control.  Continue metformin 500 mg once daily.

## 2022-05-16 NOTE — Assessment & Plan Note (Signed)
Chronic, stable.  We agreed that we will recheck lipid panel at follow-up.  Continue Crestor 5 mg

## 2022-05-16 NOTE — Patient Instructions (Signed)
Please call  and schedule your 3D mammogram and /or bone density scan as we discussed.   [facility]  ( new location in 2023)  [address], [place], [place] [address]  [place], [place]  [phone]  Nice to see you!  

## 2022-05-16 NOTE — Progress Notes (Signed)
Subjective:    Patient ID: Jaime Tate, female    DOB: 1976-08-09, 46 y.o.   MRN: [id number]  CC: Jaime Tate is a 46 y.o. female who presents today for follow up.   HPI: She feels well today.  No new complaints.  She has been drinking less sugary drinks.   DM-Compliant metformin 500 mg daily. Hyperlipidemia-compliant with Crestor 5 mg HISTORY:  Past Medical History:  Diagnosis Date   Allergy    Seasonal   Anxiety    Chicken pox    GERD (gastroesophageal reflux disease)    Past Surgical History:  Procedure Laterality Date   COLONOSCOPY WITH PROPOFOL N/A 04/05/2022   Procedure: COLONOSCOPY WITH PROPOFOL;  Surgeon: Lucilla Lame, MD;  Location: [facility] ENDOSCOPY;  Service: Endoscopy;  Laterality: N/A;   ESOPHAGOGASTRODUODENOSCOPY (EGD) WITH PROPOFOL     Family History  Problem Relation Age of Onset   Arthritis Mother    Osteoporosis Mother    High Cholesterol Mother    Heart disease Father 83   Hypertension Father    Diabetes Father    Basal cell carcinoma Father    Cancer Maternal Grandmother 62       Breast   Breast cancer Maternal Grandmother 81   Cancer Paternal Grandmother 28       Colon   Lung cancer Paternal Grandfather    Melanoma Paternal Grandfather    Ovarian cancer Neg Hx     Allergies: Biaxin [clarithromycin] Current Outpatient Medications on File Prior to Visit  Medication Sig Dispense Refill   Acetaminophen (TYLENOL PO) Take by mouth.     Cholecalciferol (VITAMIN D3) 50 MCG (2000 UT) capsule Take 2,000 Units by mouth daily.     escitalopram (LEXAPRO) 10 MG tablet TAKE 1 TABLET BY MOUTH EVERY DAY 90 tablet 2   ferrous sulfate 325 (65 FE) MG EC tablet TAKE 1 TABLET BY MOUTH 2 TIMES DAILY WITH A MEAL. 180 tablet 1   fexofenadine (ALLEGRA) 60 MG tablet Take 60 mg by mouth daily.     Ibuprofen (ADVIL PO) Take by mouth.     metFORMIN (GLUCOPHAGE-XR) 500 MG 24 hr tablet Take 1 tablet (500 mg total) by mouth every evening. 90 tablet 2   Misc Natural  Products (ELDERBERRY ZINC/VIT C/IMMUNE MT) Use as directed in the mouth or throat.     Omega 3 1000 MG CAPS Take 2,000 mg by mouth daily.     pantoprazole (PROTONIX) 20 MG tablet TAKE 1 TABLET (20 MG TOTAL) BY MOUTH EVERY MORNING. TAKE 30 MINUTES TO HOUR BEFORE BREAKFAST 90 tablet 1   Probiotic Product (PROBIOTIC ADVANCED PO) Take by mouth.     rosuvastatin (CRESTOR) 5 MG tablet Take 1 tablet (5 mg total) by mouth every evening. 90 tablet 3   No current facility-administered medications on file prior to visit.    Social History   Tobacco Use   Smoking status: Never   Smokeless tobacco: Never  Vaping Use   Vaping Use: Never used  Substance Use Topics   Alcohol use: Yes    Alcohol/week: 0.0 standard drinks of alcohol    Comment: 1 drink per week   Drug use: No    Review of Systems  Constitutional:  Negative for chills and fever.  Respiratory:  Negative for cough.   Cardiovascular:  Negative for chest pain and palpitations.  Gastrointestinal:  Negative for nausea and vomiting.      Objective:    BP 128/72 (BP Location: Left Arm, Patient  Position: Sitting, Cuff Size: Normal)   Pulse 82   Temp 98.6 F (37 C) (Oral)   Ht '5\' 3"'$  (1.6 m)   Wt 216 lb 12.8 oz (98.3 kg)   LMP  (LMP Unknown)   SpO2 97%   BMI 38.40 kg/m  BP Readings from Last 3 Encounters:  05/16/22 128/72  04/05/22 98/71  03/09/22 119/77   Wt Readings from Last 3 Encounters:  05/16/22 216 lb 12.8 oz (98.3 kg)  04/05/22 220 lb (99.8 kg)  03/09/22 222 lb 6.4 oz (100.9 kg)    Physical Exam Vitals reviewed.  Constitutional:      Appearance: She is well-developed.  Eyes:     Conjunctiva/sclera: Conjunctivae normal.  Cardiovascular:     Rate and Rhythm: Normal rate and regular rhythm.     Pulses: Normal pulses.     Heart sounds: Normal heart sounds.  Pulmonary:     Effort: Pulmonary effort is normal.     Breath sounds: Normal breath sounds. No wheezing, rhonchi or rales.  Skin:    General: Skin is warm  and dry.  Neurological:     Mental Status: She is alert.  Psychiatric:        Speech: Speech normal.        Behavior: Behavior normal.        Thought Content: Thought content normal.        Assessment & Plan:   Problem List Items Addressed This Visit       Endocrine   Diabetes mellitus without complication ([place]) - Primary    Lab Results  Component Value Date   HGBA1C 6.0 (A) 05/16/2022  Excellent control.  Continue metformin 500 mg once daily.      Relevant Orders   POCT HgB A1C (Completed)     Other   HLD (hyperlipidemia)    Chronic, stable.  We agreed that we will recheck lipid panel at follow-up.  Continue Crestor 5 mg      Other Visit Diagnoses     Encounter for screening mammogram for malignant neoplasm of breast       Relevant Orders   MM 3D SCREEN BREAST BILATERAL        I am having Jaime Tate maintain her Ibuprofen (ADVIL PO), Acetaminophen (TYLENOL PO), fexofenadine, Vitamin D3, Omega 3, Probiotic Product (PROBIOTIC ADVANCED PO), Misc Natural Products (ELDERBERRY ZINC/VIT C/IMMUNE MT), ferrous sulfate, escitalopram, metFORMIN, rosuvastatin, and pantoprazole.   No orders of the defined types were placed in this encounter.   Return precautions given.   Risks, benefits, and alternatives of the medications and treatment plan prescribed today were discussed, and patient expressed understanding.   Education regarding symptom management and diagnosis given to patient on AVS.  Continue to follow with Burnard Hawthorne, FNP for routine health maintenance.   Jaime Tate and I agreed with plan.   Jaime Paris, FNP

## 2022-05-17 ENCOUNTER — Ambulatory Visit: Payer: BC Managed Care – PPO | Admitting: Dermatology

## 2022-05-17 DIAGNOSIS — L304 Erythema intertrigo: Secondary | ICD-10-CM

## 2022-05-17 DIAGNOSIS — L814 Other melanin hyperpigmentation: Secondary | ICD-10-CM

## 2022-05-17 DIAGNOSIS — L578 Other skin changes due to chronic exposure to nonionizing radiation: Secondary | ICD-10-CM

## 2022-05-17 DIAGNOSIS — L738 Other specified follicular disorders: Secondary | ICD-10-CM

## 2022-05-17 DIAGNOSIS — L858 Other specified epidermal thickening: Secondary | ICD-10-CM

## 2022-05-17 DIAGNOSIS — Q825 Congenital non-neoplastic nevus: Secondary | ICD-10-CM

## 2022-05-17 DIAGNOSIS — D225 Melanocytic nevi of trunk: Secondary | ICD-10-CM | POA: Diagnosis not present

## 2022-05-17 DIAGNOSIS — D229 Melanocytic nevi, unspecified: Secondary | ICD-10-CM

## 2022-05-17 DIAGNOSIS — Z85828 Personal history of other malignant neoplasm of skin: Secondary | ICD-10-CM

## 2022-05-17 DIAGNOSIS — L719 Rosacea, unspecified: Secondary | ICD-10-CM | POA: Diagnosis not present

## 2022-05-17 DIAGNOSIS — L821 Other seborrheic keratosis: Secondary | ICD-10-CM

## 2022-05-17 DIAGNOSIS — Z1283 Encounter for screening for malignant neoplasm of skin: Secondary | ICD-10-CM | POA: Diagnosis not present

## 2022-05-17 DIAGNOSIS — D1800 Hemangioma unspecified site: Secondary | ICD-10-CM

## 2022-05-17 DIAGNOSIS — D2372 Other benign neoplasm of skin of left lower limb, including hip: Secondary | ICD-10-CM

## 2022-05-17 MED ORDER — HYDROCORTISONE 2.5 % EX CREA: TOPICAL_CREAM | CUTANEOUS | 3 refills | Status: AC

## 2022-05-17 MED ORDER — KETOCONAZOLE 2 % EX CREA: TOPICAL_CREAM | CUTANEOUS | 3 refills | Status: DC

## 2022-05-17 NOTE — Patient Instructions (Addendum)
For Keratosis Pilaris at upper arms  Recommend starting moisturizer with exfoliant (Urea, Salicylic acid, or Lactic acid) one to two times daily to help smooth rough and bumpy skin.  OTC options include Cetaphil Rough and Bumpy lotion (Urea), Eucerin Roughness Relief lotion or spot treatment cream (Urea), CeraVe SA lotion/cream for Rough and Bumpy skin (Sal Acid), Gold Bond Rough and Bumpy cream (Sal Acid), and AmLactin 12% lotion/cream (Lactic Acid).  If applying in morning, also apply sunscreen to sun-exposed areas, since these exfoliating moisturizers can increase sensitivity to sun.    Intertrigo  Intertrigo is a chronic recurrent rash that occurs in skin fold areas that Mallek be associated with friction; heat; moisture; yeast; fungus; and bacteria.  It is exacerbated by increased movement / activity; sweating; and higher atmospheric temperature.  Mix hydrocortisone with ketaconazole 2% twice a day. If improved, decrease to hydrocortisone and ketaconazole mixed once a day. If still clear, decrease to ketaconazole only.  Zeasorb af powder - can be purchased over the counter at most drug stores and  applied after shower to help absorb moisture    Rosacea  What is rosacea? Rosacea (say: ro-zay-sha) is a common skin disease that usually begins as a trend of flushing or blushing easily.  As rosacea progresses, a persistent redness in the center of the face will develop and Cornell gradually spread beyond the nose and cheeks to the forehead and chin.  In some cases, the ears, chest, and back could be affected.  Rosacea Mowatt appear as tiny blood vessels or small red bumps that occur in crops.  Frequently they can contain pus, and are called "pustules".  If the bumps do not contain pus, they are referred to as "papules".  Rarely, in prolonged, untreated cases of rosacea, the oil glands of the nose and cheeks Brandle become permanently enlarged.  This is called rhinophyma, and is seen more frequently in  men.  Signs and Risks In its beginning stages, rosacea tends to come and go, which makes it difficult to recognize.  It can start as intermittent flushing of the face.  Eventually, blood vessels Leitch become permanently visible.  Pustules and papules can appear, but can be mistaken for adult acne.  People of all races, ages, genders and ethnic groups are at risk of developing rosacea.  However, it is more common in women (especially around menopause) and adults with fair skin between the ages of 96 and 50.  Treatment Dermatologists typically recommend a combination of treatments to effectively manage rosacea.  Treatment can improve symptoms and Nichter stop the progression of the rosacea.  Treatment Scovill involve both topical and oral medications.  The tetracycline antibiotics are often used for their anti-inflammatory effect; however, because of the possibility of developing antibiotic resistance, they should not be used long term at full dose.  For dilated blood vessels the options include electrodessication (uses electric current through a small needle), laser treatment, and cosmetics to hide the redness.   With all forms of treatment, improvement is a slow process, and patients Winzer not see any results for the first 3-4 weeks.  It is very important to avoid the sun and other triggers.  Patients must wear sunscreen daily.  Skin Care Instructions: Cleanse the skin with a mild soap such as CeraVe cleanser, Cetaphil cleanser, or Dove soap once or twice daily as needed. Moisturize with Eucerin Redness Relief Daily Perfecting Lotion (has a subtle green tint), CeraVe Moisturizing Cream, or Oil of Olay Daily Moisturizer with sunscreen every  morning and/or night as recommended. Makeup should be "non-comedogenic" (won't clog pores) and be labeled "for sensitive skin". Good choices for cosmetics are: Neutrogena, Almay, and Physician's Formula.  Any product with a green tint tends to offset a red complexion. If your  eyes are dry and irritated, use artificial tears 2-3 times per day and cleanse the eyelids daily with baby shampoo.  Have your eyes examined at least every 2 years.  Be sure to tell your eye doctor that you have rosacea. Alcoholic beverages tend to cause flushing of the skin, and Ferrari make rosacea worse. Always wear sunscreen, protect your skin from extreme hot and cold temperatures, and avoid spicy foods, hot drinks, and mechanical irritation such as rubbing, scrubbing, or massaging the face.  Avoid harsh skin cleansers, cleansing masks, astringents, and exfoliation. If a particular product burns or makes your face feel tight, then it is likely to flare your rosacea. If you are having difficulty finding a sunscreen that you can tolerate, you Bocock try switching to a chemical-free sunscreen.  These are ones whose active ingredient is zinc oxide or titanium dioxide only.  They should also be fragrance free, non-comedogenic, and labeled for sensitive skin. Rosacea triggers Gittins vary from person to person.  There are a variety of foods that have been reported to trigger rosacea.  Some patients find that keeping a diary of what they were doing when they flared helps them avoid triggers.   Seborrheic Keratosis  What causes seborrheic keratoses? Seborrheic keratoses are harmless, common skin growths that first appear during adult life.  As time goes by, more growths appear.  Some people Sensing develop a large number of them.  Seborrheic keratoses appear on both covered and uncovered body parts.  They are not caused by sunlight.  The tendency to develop seborrheic keratoses can be inherited.  They vary in color from skin-colored to gray, brown, or even black.  They can be either smooth or have a rough, warty surface.   Seborrheic keratoses are superficial and look as if they were stuck on the skin.  Under the microscope this type of keratosis looks like layers upon layers of skin.  That is why at times the top layer  Gwynn seem to fall off, but the rest of the growth remains and re-grows.    Treatment Seborrheic keratoses do not need to be treated, but can easily be removed in the office.  Seborrheic keratoses often cause symptoms when they rub on clothing or jewelry.  Lesions can be in the way of shaving.  If they become inflamed, they can cause itching, soreness, or burning.  Removal of a seborrheic keratosis can be accomplished by freezing, burning, or surgery. If any spot bleeds, scabs, or grows rapidly, please return to have it checked, as these can be an indication of a skin cancer.  Melanoma ABCDEs  Melanoma is the most dangerous type of skin cancer, and is the leading cause of death from skin disease.  You are more likely to develop melanoma if you: Have light-colored skin, light-colored eyes, or red or blond hair Spend a lot of time in the sun Tan regularly, either outdoors or in a tanning bed Have had blistering sunburns, especially during childhood Have a close family member who has had a melanoma Have atypical moles or large birthmarks  Early detection of melanoma is key since treatment is typically straightforward and cure rates are extremely high if we catch it early.   The first sign of melanoma  is often a change in a mole or a new dark spot.  The ABCDE system is a way of remembering the signs of melanoma.  A for asymmetry:  The two halves do not match. B for border:  The edges of the growth are irregular. C for color:  A mixture of colors are present instead of an even brown color. D for diameter:  Melanomas are usually (but not always) greater than 69m - the size of a pencil eraser. E for evolution:  The spot keeps changing in size, shape, and color.  Please check your skin once per month between visits. You can use a small mirror in front and a large mirror behind you to keep an eye on the back side or your body.   If you see any new or changing lesions before your next follow-up,  please call to schedule a visit.  Please continue daily skin protection including broad spectrum sunscreen SPF 30+ to sun-exposed areas, reapplying every 2 hours as needed when you're outdoors.   Staying in the shade or wearing long sleeves, sun glasses (UVA+UVB protection) and wide brim hats (4-inch brim around the entire circumference of the hat) are also recommended for sun protection.    Due to recent changes in healthcare laws, you Goeden see results of your pathology and/or laboratory studies on MyChart before the doctors have had a chance to review them. We understand that in some cases there Barkdull be results that are confusing or concerning to you. Please understand that not all results are received at the same time and often the doctors Landini need to interpret multiple results in order to provide you with the best plan of care or course of treatment. Therefore, we ask that you please give uKorea2 business days to thoroughly review all your results before contacting the office for clarification. Should we see a critical lab result, you will be contacted sooner.   If You Need Anything After Your Visit  If you have any questions or concerns for your doctor, please call our main line at [phone] press option 4 to reach your doctor's medical assistant. If no one answers, please leave a voicemail as directed and we will return your call as soon as possible. Messages left after 4 pm will be answered the following business day.   You Brazeau also send uKoreaa message via MSibley We typically respond to MyChart messages within 1-2 business days.  For prescription refills, please ask your pharmacy to contact our office. Our fax number is [phone]  If you have an urgent issue when the clinic is closed that cannot wait until the next business day, you can page your doctor at the number below.    Please note that while we do our best to be available for urgent issues outside of office hours, we are not  available 24/7.   If you have an urgent issue and are unable to reach uKorea you Geron choose to seek medical care at your doctor's office, retail clinic, urgent care center, or emergency room.  If you have a medical emergency, please immediately call 911 or go to the emergency department.  Pager Numbers  - Dr. KNehemiah Massed [phone] - Dr. MLaurence Ferrari [phone] - Dr. SNicole Kindred [phone] In the event of inclement weather, please call our main line at [phone] an update on the status of any delays or closures.  Dermatology Medication Tips: Please keep the boxes that topical medications come in in  order to help keep track of the instructions about where and how to use these. Pharmacies typically print the medication instructions only on the boxes and not directly on the medication tubes.   If your medication is too expensive, please contact our office at [phone] option 4 or send [place] a message through [place].   We are unable to tell what your co-pay for medications will be in advance as this is different depending on your insurance coverage. However, we Caughlin be able to find a substitute medication at lower cost or fill out paperwork to get insurance to cover a needed medication.   If a prior authorization is required to get your medication covered by your insurance company, please allow [place] 1-2 business days to complete this process.  Drug prices often vary depending on where the prescription is filled and some pharmacies Hostetler offer cheaper prices.  The website www.goodrx.com contains coupons for medications through different pharmacies. The prices here do not account for what the cost Brandau be with help from insurance (it Soland be cheaper with your insurance), but the website can give you the price if you did not use any insurance.  - You can print the associated coupon and take it with your prescription to the pharmacy.  - You Brouse also stop by our office during regular business hours  and pick up a GoodRx coupon card.  - If you need your prescription sent electronically to a different pharmacy, notify our office through [facility] or by phone at [phone] option 4.     Si Usted Necesita Algo Despus de Su Visita  Tambin puede enviarnos un mensaje a travs de Pharmacist, community. Por lo general respondemos a los mensajes de MyChart en el transcurso de 1 a 2 das hbiles.  Para renovar recetas, por favor pida a su farmacia que se ponga en contacto con nuestra oficina. Harland Dingwall de fax es [place] [phone].  Si tiene un asunto urgente cuando la clnica est cerrada y que no puede esperar hasta el siguiente da hbil, puede llamar/localizar a su doctor(a) al nmero que aparece a continuacin.   Por favor, tenga en cuenta que aunque hacemos todo lo posible para estar disponibles para asuntos urgentes fuera del horario de [place], no estamos disponibles las 24 horas del da, los 7 das de la [place].   Si tiene un problema urgente y no puede comunicarse con nosotros, puede optar por buscar atencin mdica  en el consultorio de su doctor(a), en una clnica privada, en un centro de atencin urgente o en una sala de emergencias.  Si tiene Engineering geologist, por favor llame inmediatamente al 911 o vaya a la sala de emergencias.  Nmeros de bper  - Dr. Nehemiah Massed: [phone]  - Dra. Moye: [phone]  - Dra. Nicole Kindred: [phone]  En caso de inclemencias del [place], por favor llame a Johnsie Kindred principal al [phone] para una actualizacin sobre el [place] de cualquier retraso o cierre.  Consejos para la medicacin en dermatologa: Por favor, guarde las cajas en las que vienen los medicamentos de uso tpico para ayudarle a seguir las instrucciones sobre dnde y cmo usarlos. Las farmacias generalmente imprimen las instrucciones del medicamento slo en las cajas y no directamente en los tubos del [place].   Si su medicamento es muy caro, por favor, pngase  en contacto con Zigmund Daniel llamando al [phone] y presione la opcin 4 o envenos un mensaje a travs de Pharmacist, community.   No podemos decirle cul ser su  copago por los medicamentos por adelantado ya que esto es diferente dependiendo de la cobertura de su seguro. Sin embargo, es posible que podamos encontrar un medicamento sustituto a Electrical engineer un formulario para que el seguro cubra el medicamento que se considera necesario.   Si se requiere una autorizacin previa para que su compaa de seguros [place] su medicamento, por favor permtanos de 1 a 2 das hbiles para completar este proceso.  Los precios de los medicamentos varan con frecuencia dependiendo del Environmental consultant de dnde se surte la receta y alguna farmacias pueden ofrecer precios ms baratos.  El sitio web www.goodrx.com tiene cupones para medicamentos de Airline pilot. Los precios aqu no tienen en cuenta lo que podra costar con la ayuda del seguro (puede ser ms barato con su seguro), pero el sitio web puede darle el precio si no utiliz Research scientist (physical sciences).  - Puede imprimir el cupn correspondiente y llevarlo con su receta a la farmacia.  - Tambin puede pasar por nuestra oficina durante el horario de atencin regular y Charity fundraiser una tarjeta de cupones de GoodRx.  - Si necesita que su receta se enve electrnicamente a una farmacia diferente, informe a nuestra oficina a travs de MyChart de [place] o por telfono llamando al [phone] y presione la opcin 4.

## 2022-05-17 NOTE — Progress Notes (Signed)
New Patient Visit  Subjective  Jaime Tate is a 46 y.o. female who presents for the following: New Patient (Initial Visit) (Patient reports family history of skin cancer scc, bcc, and melanoma in father and paternal grandfather. Hx of rosacea. ).  The patient presents for Total-Body Skin Exam (TBSE) for skin cancer screening and mole check.  The patient has spots, moles and lesions to be evaluated, some Cabiness be new or changing and the patient has concerns that these could be cancer.   The following portions of the chart were reviewed this encounter and updated as appropriate:       Review of Systems:  No other skin or systemic complaints except as noted in HPI or Assessment and Plan.  Objective  Well appearing patient in no apparent distress; mood and affect are within normal limits.  A full examination was performed including scalp, head, eyes, ears, nose, lips, neck, chest, axillae, abdomen, back, buttocks, bilateral upper extremities, bilateral lower extremities, hands, feet, fingers, toes, fingernails, and toenails. All findings within normal limits unless otherwise noted below.  Head - Anterior (Face) Mid face erythema with telangiectasias  Left Upper Back 5 mm speckled brown macule   Right upper medial buttock 1 cm brown macule with slight irregular pigment, present as long as she can remember, no changes  BL Inframammary Fold Mild erythema at inframammary area    Assessment & Plan  Rosacea Head - Anterior (Face)  Rosacea is a chronic progressive skin condition usually affecting the face of adults, causing redness and/or acne bumps. It is treatable but not curable. It sometimes affects the eyes (ocular rosacea) as well. It Baetz respond to topical and/or systemic medication and can flare with stress, sun exposure, alcohol, exercise and some foods.  Daily application of broad spectrum spf 30+ sunscreen to face is recommended to reduce flares.  Discussed topical  treatments  Patient deferred treatment at this time.  Recommend moisturizer with sunscreen qam  Discussed the treatment option of BBL/laser for redness.  Typically we recommend 1-3 treatment sessions about 5-8 weeks apart for best results.  The patient's condition Cogburn require "maintenance treatments" in the future.  The fee for BBL / laser treatments is $350 per treatment session for the whole face.  A fee can be quoted for other parts of the body. Insurance typically does not pay for BBL/laser treatments and therefore the fee is an out-of-pocket cost.    Nevus Left Upper Back  Benign-appearing.  Observation.  Call clinic for new or changing lesions.  Recommend daily use of broad spectrum spf 30+ sunscreen to sun-exposed areas.    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  Observation.  Call clinic for new or changing moles.  Recommend daily use of broad spectrum spf 30+ sunscreen to sun-exposed areas.    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 Condition is symptomatic/ bothersome to patient. Not currently at goal.   Intertrigo is a chronic recurrent rash that occurs in skin fold areas that Stellmach be associated with friction; heat; moisture; yeast; fungus; and bacteria.  It is exacerbated by increased movement / activity; sweating; and higher atmospheric temperature.   Ketoconazole cream 2% qd/bid   Hc 2.5% cream qd/bid prn flares  Mix hydrocortisone with ketaconazole 2% twice a day. If improved, decrease to hydrocortisone and ketaconazole mixed once a day. If still clear, decrease to ketaconazole only.  Zeasorb AF powder -  can be applied daily to help absorb moisture   ketoconazole (NIZORAL) 2 % cream - BL Inframammary Fold Use as directed. Instructions in pt handout  hydrocortisone 2.5 % cream - BL Inframammary Fold Use as directed. Instruction in patient  handout  Lentigines - Scattered tan macules - Due to sun exposure - Benign-appearing, observe - Recommend daily broad spectrum sunscreen SPF 30+ to sun-exposed areas, reapply every 2 hours as needed. - Call for any changes  Seborrheic Keratoses - Stuck-on, waxy, tan-brown papules and/or plaques  - Benign-appearing - Discussed benign etiology and prognosis. - Observe - Call for any changes  Melanocytic Nevi - Tan-brown and/or pink-flesh-colored symmetric macules and papules - Benign appearing on exam today - Observation - Call clinic for new or changing moles - Recommend daily use of broad spectrum spf 30+ sunscreen to sun-exposed areas.   Keratosis Pilaris - Tiny follicular keratotic papules - Benign. Genetic in nature. No cure. - Observe. - If desired, patient can use an emollient (moisturizer) containing ammonium lactate, urea or salicylic acid once a day to smooth the area  Dermatofibroma at left anterior upper thigh  Hx of prior bx  - Firm pink/brown papulenodule with dimple sign - Benign appearing - Call for any changes  Sebaceous Hyperplasia at face - Small yellow papules with a central dell - Benign - Observe  Hemangiomas - Red papules - Discussed benign nature - Observe - Call for any changes  Actinic Damage - Chronic condition, secondary to cumulative UV/sun exposure - diffuse scaly erythematous macules with underlying dyspigmentation - Recommend daily broad spectrum sunscreen SPF 30+ to sun-exposed areas, reapply every 2 hours as needed.  - Staying in the shade or wearing long sleeves, sun glasses (UVA+UVB protection) and wide brim hats (4-inch brim around the entire circumference of the hat) are also recommended for sun protection.  - Call for new or changing lesions.  Skin cancer screening performed today. Return for 1-2 yrs TBSE. I, Ruthell Rummage, CMA, am acting as scribe for Brendolyn Patty, MD.  Documentation: I have reviewed the above  documentation for accuracy and completeness, and I agree with the above.  Brendolyn Patty MD

## 2022-05-19 DIAGNOSIS — H9312 Tinnitus, left ear: Secondary | ICD-10-CM | POA: Diagnosis not present

## 2022-07-04 ENCOUNTER — Telehealth: Payer: Self-pay

## 2022-07-04 NOTE — Telephone Encounter (Signed)
Noted.  Will close encounter.  

## 2022-07-04 NOTE — Telephone Encounter (Signed)
Lm for patient to ask if she has had previous sleep study.  

## 2022-07-04 NOTE — Telephone Encounter (Signed)
Patient states has not had any sleep studies. Patient phone number is [phone].

## 2022-07-05 ENCOUNTER — Encounter: Payer: Self-pay | Admitting: Adult Health

## 2022-07-05 ENCOUNTER — Ambulatory Visit (INDEPENDENT_AMBULATORY_CARE_PROVIDER_SITE_OTHER): Payer: BC Managed Care – PPO | Admitting: Adult Health

## 2022-07-05 VITALS — BP 124/72 | HR 77 | Temp 97.9°F | Ht 63.5 in | Wt 216.0 lb

## 2022-07-05 DIAGNOSIS — R0683 Snoring: Secondary | ICD-10-CM | POA: Diagnosis not present

## 2022-07-05 NOTE — Patient Instructions (Signed)
Set up for home sleep study. Healthy sleep regimen. Work on healthy weight loss Do not drive if sleepy Follow-up in 6 weeks to discuss sleep study results and treatment plan.

## 2022-07-05 NOTE — Progress Notes (Signed)
$'@Patient'i$  ID: Jaime Tate, female    DOB: February 29, 1976, 46 y.o.   MRN: [id number]  Chief Complaint  Patient presents with   sleep consult    Referring provider: Pieter Partridge, DO  HPI: 46 year old female seen for sleep consult July 05, 2022 for snoring, daytime sleepiness and headaches.  TEST/EVENTS :   07/05/2022 Sleep consult  Patient presents for sleep consult today.  Kindly referred by Dr. Tomi Likens.  Patient complains of snoring, daytime sleepiness, morning headaches.  Wakes up tired, unrefreshed .wakes up with headache 2 times a week. Has dry mouth a lot . Spouse says she snores. Epworth score is 10 out of 24.  Typically gets sleepy if she tries to watch TV, is a passenger in a car or in the afternoon hours.  Sleep is restless and she wakes up feeling unrefreshed. Patient typically goes to bed around 10:30 . Takes 1.5hr to go sleep. Cant turn mind off. Up 1 time each night to go to bathroom. Gets up at 7 am.  Recently diagnosed with DM earlier this year. Has lost 30lbs . Current weight at 216 lbs, BMI 37  Caffeine intake 1 cup of coffee.  No symptoms suspicious for cataplexy. 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  No history of congestive heart failure or stroke. No removable dental work. Does not use sleep aides.    PMH   DM, Hyperlipidemia , GERD, Anxiety, Allergies   SH:  Married. Has 1 child . Works for [employer], Secretary/administrator.  Never smoker. Rare alcohol. No drugs.   Surgical history : None   FH : Breast cancer , Lung cancer , Melanoma , Skin cancer , Heart disease, colon cancer , DM  Father has OSA     Allergies  Allergen Reactions   Biaxin [Clarithromycin] Diarrhea and Nausea And Vomiting    Immunization History  Administered Date(s) Administered   Influenza,inj,Quad PF,6+ Mos 01/04/2016, 09/12/2016, 10/10/2018, 09/05/2019, 09/27/2020, 08/28/2021   Influenza-Unspecified 09/09/2017, 09/15/2018   PFIZER(Purple Top)SARS-COV-2 Vaccination 02/19/2020,  03/11/2020, 10/09/2020   Pfizer Covid-19 Vaccine Bivalent Booster 51yr & up 08/28/2021   Tdap 10/01/2019    Past Medical History:  Diagnosis Date   Allergy    Seasonal   Anxiety    Chicken pox    GERD (gastroesophageal reflux disease)     Tobacco History: Social History   Tobacco Use  Smoking Status Never  Smokeless Tobacco Never   Counseling given: Not Answered   Outpatient Medications Prior to Visit  Medication Sig Dispense Refill   Acetaminophen (TYLENOL PO) Take by mouth.     Cholecalciferol (VITAMIN D3) 50 MCG (2000 UT) capsule Take 2,000 Units by mouth daily.     escitalopram (LEXAPRO) 10 MG tablet TAKE 1 TABLET BY MOUTH EVERY DAY 90 tablet 2   ferrous sulfate 325 (65 FE) MG EC tablet TAKE 1 TABLET BY MOUTH 2 TIMES DAILY WITH A MEAL. 180 tablet 1   fexofenadine (ALLEGRA) 60 MG tablet Take 60 mg by mouth daily.     hydrocortisone 2.5 % cream Use as directed. Instruction in patient handout 30 g 3   Ibuprofen (ADVIL PO) Take by mouth.     ketoconazole (NIZORAL) 2 % cream Use as directed. Instructions in pt handout 30 g 3   metFORMIN (GLUCOPHAGE-XR) 500 MG 24 hr tablet Take 1 tablet (500 mg total) by mouth every evening. 90 tablet 2   Misc Natural Products (ELDERBERRY ZINC/VIT C/IMMUNE MT) Use as directed in the mouth or  throat.     Omega 3 1000 MG CAPS Take 2,000 mg by mouth daily.     pantoprazole (PROTONIX) 20 MG tablet TAKE 1 TABLET (20 MG TOTAL) BY MOUTH EVERY MORNING. TAKE 30 MINUTES TO HOUR BEFORE BREAKFAST 90 tablet 1   Probiotic Product (PROBIOTIC ADVANCED PO) Take by mouth.     rosuvastatin (CRESTOR) 5 MG tablet Take 1 tablet (5 mg total) by mouth every evening. 90 tablet 3   No facility-administered medications prior to visit.     Review of Systems:   Constitutional:   No  weight loss, night sweats,  Fevers, chills,  +fatigue, or  lassitude.  HEENT:   No   Difficulty swallowing,  Tooth/dental problems, or  Sore throat,                No sneezing,  itching, ear ache, nasal congestion, post nasal drip,   CV:  No chest pain,  Orthopnea, PND, swelling in lower extremities, anasarca, dizziness, palpitations, syncope.   GI  No heartburn, indigestion, abdominal pain, nausea, vomiting, diarrhea, change in bowel habits, loss of appetite, bloody stools.   Resp: No shortness of breath with exertion or at rest.  No excess mucus, no productive cough,  No non-productive cough,  No coughing up of blood.  No change in color of mucus.  No wheezing.  No chest wall deformity  Skin: no rash or lesions.  GU: no dysuria, change in color of urine, no urgency or frequency.  No flank pain, no hematuria   MS:  No joint pain or swelling.  No decreased range of motion.  No back pain.    Physical Exam  BP 124/72 (BP Location: Left Arm, Cuff Size: Normal)   Pulse 77   Temp 97.9 F (36.6 C) (Temporal)   Ht 5' 3.5" (1.613 m)   Wt 216 lb (98 kg)   SpO2 98%   BMI 37.66 kg/m   GEN: A/Ox3; pleasant , NAD, well nourished    HEENT:  [place]/AT,   NOSE-clear, THROAT-clear, no lesions, no postnasal drip or exudate noted. Class 2 MP airway   NECK:  Supple w/ fair ROM; no JVD; normal carotid impulses w/o bruits; no thyromegaly or nodules palpated; no lymphadenopathy.    RESP  Clear  P & A; w/o, wheezes/ rales/ or rhonchi. no accessory muscle use, no dullness to percussion  CARD:  RRR, no m/r/g, no peripheral edema, pulses intact, no cyanosis or clubbing.  GI:   Soft & nt; nml bowel sounds; no organomegaly or masses detected.   Musco: Warm bil, no deformities or joint swelling noted.   Neuro: alert, no focal deficits noted.    Skin: Warm, no lesions or rashes    Lab Results:      BNP No results found for: "BNP"  ProBNP No results found for: "PROBNP"  Imaging: No results found.        No data to display          No results found for: "NITRICOXIDE"      Assessment & Plan:   Snoring Snoring, daytime sleepiness, morning headaches,  BMI 37 concerning for underlying sleep apnea. Set up for home sleep study.  Healthy sleep regimen discussed.  - discussed how weight can impact sleep and risk for sleep disordered breathing - discussed options to assist with weight loss: combination of diet modification, cardiovascular and strength training exercises   - had an extensive discussion regarding the adverse health consequences related to untreated sleep disordered  breathing - specifically discussed the risks for hypertension, coronary artery disease, cardiac dysrhythmias, cerebrovascular disease, and diabetes - lifestyle modification discussed   - discussed how sleep disruption can increase risk of accidents, particularly when driving - safe driving practices were discussed   Plan  Patient Instructions  Set up for home sleep study. Healthy sleep regimen. Work on healthy weight loss Do not drive if sleepy Follow-up in 6 weeks to discuss sleep study results and treatment plan.        Rexene Edison, NP 07/05/2022

## 2022-07-05 NOTE — Assessment & Plan Note (Signed)
Snoring, daytime sleepiness, morning headaches, BMI 37 concerning for underlying sleep apnea. Set up for home sleep study.  Healthy sleep regimen discussed.  - discussed how weight can impact sleep and risk for sleep disordered breathing - discussed options to assist with weight loss: combination of diet modification, cardiovascular and strength training exercises   - had an extensive discussion regarding the adverse health consequences related to untreated sleep disordered breathing - specifically discussed the risks for hypertension, coronary artery disease, cardiac dysrhythmias, cerebrovascular disease, and diabetes - lifestyle modification discussed   - discussed how sleep disruption can increase risk of accidents, particularly when driving - safe driving practices were discussed   Plan  Patient Instructions  Set up for home sleep study. Healthy sleep regimen. Work on healthy weight loss Do not drive if sleepy Follow-up in 6 weeks to discuss sleep study results and treatment plan.

## 2022-07-07 ENCOUNTER — Encounter: Payer: Self-pay | Admitting: Family

## 2022-07-07 NOTE — Progress Notes (Signed)
Reviewed and agree with assessment/plan.   Chesley Mires, MD [facility] Pulmonary/Critical Care 07/07/2022, 9:55 AM Pager:  [phone]

## 2022-07-07 NOTE — Telephone Encounter (Signed)
Spoke to patient and scheduled her an appointment to come into office on 07/18/22 to discuss heel pain

## 2022-07-18 ENCOUNTER — Ambulatory Visit: Payer: BC Managed Care – PPO | Admitting: Family

## 2022-07-18 ENCOUNTER — Encounter: Payer: Self-pay | Admitting: Family

## 2022-07-18 DIAGNOSIS — M79671 Pain in right foot: Secondary | ICD-10-CM | POA: Insufficient documentation

## 2022-07-18 MED ORDER — MELOXICAM 7.5 MG PO TABS: 7.5000 mg | ORAL_TABLET | Freq: Every day | ORAL | 1 refills | Status: DC | PRN

## 2022-07-18 MED ORDER — PREDNISONE 10 MG PO TABS: ORAL_TABLET | ORAL | 0 refills | Status: DC

## 2022-07-18 NOTE — Progress Notes (Signed)
Subjective:    Patient ID: Jaime Tate, female    DOB: Jun 11, 1976, 46 y.o.   MRN: [id number]  CC: Jaime Tate is a 46 y.o. female who presents today for an acute visit.    HPI: Right heel pain x 6 weeks, unchanged Stabbing heel pain. Improves with rest, worse with prolonged periods of walking.  When she first wakes up in the morning, intense heel pain. She has noticed some improvement when she has to wear supportive arch in her shoes.  No fever, wound, swelling, numbness, increased heat.  She has also been hiking this past month and thinks has exacerbated. Using ice roller on fascia, compression socks.   She has been on prednisone in the past without side effects      HISTORY:  Past Medical History:  Diagnosis Date   Allergy    Seasonal   Anxiety    Chicken pox    GERD (gastroesophageal reflux disease)    Past Surgical History:  Procedure Laterality Date   COLONOSCOPY WITH PROPOFOL N/A 04/05/2022   Procedure: COLONOSCOPY WITH PROPOFOL;  Surgeon: Lucilla Lame, MD;  Location: [facility] ENDOSCOPY;  Service: Endoscopy;  Laterality: N/A;   ESOPHAGOGASTRODUODENOSCOPY (EGD) WITH PROPOFOL     Family History  Problem Relation Age of Onset   Arthritis Mother    Osteoporosis Mother    High Cholesterol Mother    Heart disease Father 63   Hypertension Father    Diabetes Father    Basal cell carcinoma Father    Cancer Maternal Grandmother 53       Breast   Breast cancer Maternal Grandmother 81   Cancer Paternal Grandmother 30       Colon   Lung cancer Paternal Grandfather    Melanoma Paternal Grandfather    Ovarian cancer Neg Hx     Allergies: Biaxin [clarithromycin] Current Outpatient Medications on File Prior to Visit  Medication Sig Dispense Refill   Acetaminophen (TYLENOL PO) Take by mouth.     Cholecalciferol (VITAMIN D3) 50 MCG (2000 UT) capsule Take 2,000 Units by mouth daily.     escitalopram (LEXAPRO) 10 MG tablet TAKE 1 TABLET BY MOUTH EVERY DAY 90 tablet 2    ferrous sulfate 325 (65 FE) MG EC tablet TAKE 1 TABLET BY MOUTH 2 TIMES DAILY WITH A MEAL. 180 tablet 1   fexofenadine (ALLEGRA) 60 MG tablet Take 60 mg by mouth daily.     hydrocortisone 2.5 % cream Use as directed. Instruction in patient handout 30 g 3   Ibuprofen (ADVIL PO) Take by mouth.     ketoconazole (NIZORAL) 2 % cream Use as directed. Instructions in pt handout 30 g 3   metFORMIN (GLUCOPHAGE-XR) 500 MG 24 hr tablet Take 1 tablet (500 mg total) by mouth every evening. 90 tablet 2   Misc Natural Products (ELDERBERRY ZINC/VIT C/IMMUNE MT) Use as directed in the mouth or throat.     Omega 3 1000 MG CAPS Take 2,000 mg by mouth daily.     pantoprazole (PROTONIX) 20 MG tablet TAKE 1 TABLET (20 MG TOTAL) BY MOUTH EVERY MORNING. TAKE 30 MINUTES TO HOUR BEFORE BREAKFAST 90 tablet 1   Probiotic Product (PROBIOTIC ADVANCED PO) Take by mouth.     rosuvastatin (CRESTOR) 5 MG tablet Take 1 tablet (5 mg total) by mouth every evening. 90 tablet 3   No current facility-administered medications on file prior to visit.    Social History   Tobacco Use   Smoking status: Never  Smokeless tobacco: Never  Vaping Use   Vaping Use: Never used  Substance Use Topics   Alcohol use: Yes    Alcohol/week: 0.0 standard drinks of alcohol    Comment: 1 drink per week   Drug use: No    Review of Systems  Constitutional:  Negative for chills and fever.  Respiratory:  Negative for cough.   Cardiovascular:  Negative for chest pain, palpitations and leg swelling.  Gastrointestinal:  Negative for nausea and vomiting.  Musculoskeletal:  Positive for arthralgias.      Objective:    BP 126/68 (BP Location: Left Arm, Patient Position: Sitting, Cuff Size: Normal)   Pulse 83   Temp 98 F (36.7 C) (Oral)   Ht '5\' 3"'$  (1.6 m)   Wt 215 lb 9.6 oz (97.8 kg)   LMP  (LMP Unknown)   SpO2 99%   BMI 38.19 kg/m    Physical Exam Vitals reviewed.  Constitutional:      Appearance: She is well-developed.  Eyes:      Conjunctiva/sclera: Conjunctivae normal.  Cardiovascular:     Rate and Rhythm: Normal rate and regular rhythm.     Pulses: Normal pulses.     Heart sounds: Normal heart sounds.  Pulmonary:     Effort: Pulmonary effort is normal.     Breath sounds: Normal breath sounds. No wheezing, rhonchi or rales.  Musculoskeletal:       Feet:  Feet:     Comments: Pain over heel of right foot.  No swelling, erythema.  Skin is intact.  Planta fascia with dorsiflexion right foot Skin:    General: Skin is warm and dry.  Neurological:     Mental Status: She is alert.  Psychiatric:        Speech: Speech normal.        Behavior: Behavior normal.        Thought Content: Thought content normal.        Assessment & Plan:      I am having Jaime Tate start on predniSONE and meloxicam. I am also having her maintain her Ibuprofen (ADVIL PO), Acetaminophen (TYLENOL PO), fexofenadine, Vitamin D3, Omega 3, Probiotic Product (PROBIOTIC ADVANCED PO), Misc Natural Products (ELDERBERRY ZINC/VIT C/IMMUNE MT), ferrous sulfate, escitalopram, metFORMIN, rosuvastatin, pantoprazole, ketoconazole, and hydrocortisone.   Meds ordered this encounter  Medications   predniSONE (DELTASONE) 10 MG tablet    Sig: Take 4 tablets ( total 40 mg) by mouth for 2 days; take 3 tablets ( total 30 mg) by mouth for 2 days; take 2 tablets ( total 20 mg) by mouth for 1 day; take 1 tablet ( total 10 mg) by mouth for 1 day.    Dispense:  17 tablet    Refill:  0    Order Specific Question:   Supervising Provider    Answer:   Deborra Medina L [[id number]]   meloxicam (MOBIC) 7.5 MG tablet    Sig: Take 1 tablet (7.5 mg total) by mouth daily as needed for pain.    Dispense:  30 tablet    Refill:  1    Order Specific Question:   Supervising Provider    Answer:   Crecencio Mc [[id number]]    Return precautions given.   Risks, benefits, and alternatives of the medications and treatment plan prescribed today were discussed, and patient  expressed understanding.   Education regarding symptom management and diagnosis given to patient on AVS.  Continue to follow with Vidal Schwalbe Yvetta Coder, FNP for  routine health maintenance.   Abbott Pao Sneath and I agreed with plan.   Mable Paris, FNP

## 2022-07-18 NOTE — Patient Instructions (Addendum)
Start prednisone as discussed.  Make sure you take all doses prior to 1 PM each day as can interfere with sleep.  You Romaniello start meloxicam with or after you have completed prednisone.  Please ensure you take meloxicam which is a potent anti-inflammatory with food in your stomach.  After you have taken meloxicam for up to 7 to 14 days, please discontinue as it as is not a medication I intended for you to take long-term  continue to ice and roll the plantar fascia.  Continue to wear supportive shoes.  If no resolution, please let me know and I will place referral to podiatry

## 2022-07-18 NOTE — Progress Notes (Signed)
Was hiking in July no fall but has been having pain ever since

## 2022-07-18 NOTE — Assessment & Plan Note (Signed)
Presentation and exam consistent with plantar fasciitis.  We agreed to trial prednisone taper with meloxicam. counseled patient on how to take prednisone and side effects of prednisone. she will continue icing regimen and no improvement, will refer to podiatry

## 2022-07-29 ENCOUNTER — Ambulatory Visit
Admission: RE | Admit: 2022-07-29 | Discharge: 2022-07-29 | Disposition: A | Discharge status: Home / Self Care | Payer: BC Managed Care – PPO | Source: Ambulatory Visit | Attending: Family | Admitting: Family

## 2022-07-29 DIAGNOSIS — Z1231 Encounter for screening mammogram for malignant neoplasm of breast: Secondary | ICD-10-CM | POA: Diagnosis not present

## 2022-08-11 ENCOUNTER — Telehealth: Payer: Self-pay | Admitting: Adult Health

## 2022-08-11 NOTE — Telephone Encounter (Signed)
I have left a message asking the patient to call me about scheduling picking up the HST machine

## 2022-08-11 NOTE — Telephone Encounter (Signed)
Inquiring about scheduling HST- the notes say ready to schedule. She has an ov on 9/15- needs to know if she needs to reschedule the OV or if HST will be done before then.

## 2022-08-12 NOTE — Telephone Encounter (Signed)
I have spoke with Jaime Tate and she has been scheduled to pick up HST machine on 08/16/22 '@3'$ :30pm. She will reschedule her appt while she is here

## 2022-08-16 ENCOUNTER — Ambulatory Visit: Payer: BC Managed Care – PPO

## 2022-08-16 ENCOUNTER — Ambulatory Visit: Payer: BC Managed Care – PPO | Admitting: Neurology

## 2022-08-16 DIAGNOSIS — G4733 Obstructive sleep apnea (adult) (pediatric): Secondary | ICD-10-CM | POA: Diagnosis not present

## 2022-08-16 DIAGNOSIS — R0683 Snoring: Secondary | ICD-10-CM

## 2022-08-18 ENCOUNTER — Telehealth: Payer: Self-pay | Admitting: Adult Health

## 2022-08-18 DIAGNOSIS — G4733 Obstructive sleep apnea (adult) (pediatric): Secondary | ICD-10-CM | POA: Diagnosis not present

## 2022-08-18 NOTE — Telephone Encounter (Signed)
Home sleep study done on August 16, 2022 showed moderate sleep apnea with AHI at 16.4/hour and SPO2 low at 74%.  Please set up for an office or virtual visit to discuss sleep study results and treatment plan

## 2022-08-18 NOTE — Telephone Encounter (Signed)
-----   Message from Donne Hazel sent at 08/18/2022  2:41 PM EDT ----- Hello this patient's HST report is ready for review

## 2022-08-19 ENCOUNTER — Telehealth: Payer: BC Managed Care – PPO | Admitting: Adult Health

## 2022-08-24 NOTE — Telephone Encounter (Signed)
ATC x1. LVM to return call.  Patient has a mychart visit set up for 08/26/22 at 9:30 am.

## 2022-08-25 ENCOUNTER — Other Ambulatory Visit: Payer: Self-pay | Admitting: Family

## 2022-08-25 DIAGNOSIS — M79671 Pain in right foot: Secondary | ICD-10-CM

## 2022-08-25 NOTE — Telephone Encounter (Signed)
Patient scheduled to f/u with Rexene Edison NP on 9/22.  Nothing further needed.

## 2022-08-26 ENCOUNTER — Telehealth (INDEPENDENT_AMBULATORY_CARE_PROVIDER_SITE_OTHER): Payer: BC Managed Care – PPO | Admitting: Adult Health

## 2022-08-26 DIAGNOSIS — G4733 Obstructive sleep apnea (adult) (pediatric): Secondary | ICD-10-CM | POA: Diagnosis not present

## 2022-08-26 NOTE — Progress Notes (Signed)
Virtual Visit via Video Note  I connected with Jaime Tate on 08/26/22 at  9:30 AM EDT by a video enabled telemedicine application and verified that I am speaking with the correct person using two identifiers.  Location: Patient: Home  Provider: Office    I discussed the limitations of evaluation and management by telemedicine and the availability of in person appointments. The patient expressed understanding and agreed to proceed.  History of Present Illness: 46 year old female seen for sleep consult July 05, 2022 for snoring, daytime sleepiness and headaches found to have moderate obstructive sleep apnea  Today's video visit is a 6-week follow-up.  Patient was seen last visit for sleep consult.  She had daytime sleepiness, snoring, headaches.  She was set up for home sleep study that was completed on August 16, 2022 that showed moderate sleep apnea with AHI at 16.4/hour and SPO2 low at 74%.  We discussed her sleep study results in detail.  Went over treatment options including weight loss, oral appliance and CPAP.  Patient would like to proceed with CPAP therapy.  Patient education was provided  Past Medical History:  Diagnosis Date   Allergy    Seasonal   Anxiety    Chicken pox    GERD (gastroesophageal reflux disease)    Current Outpatient Medications on File Prior to Visit  Medication Sig Dispense Refill   Acetaminophen (TYLENOL PO) Take by mouth.     Cholecalciferol (VITAMIN D3) 50 MCG (2000 UT) capsule Take 2,000 Units by mouth daily.     escitalopram (LEXAPRO) 10 MG tablet TAKE 1 TABLET BY MOUTH EVERY DAY 90 tablet 2   ferrous sulfate 325 (65 FE) MG EC tablet TAKE 1 TABLET BY MOUTH 2 TIMES DAILY WITH A MEAL. 180 tablet 1   fexofenadine (ALLEGRA) 60 MG tablet Take 60 mg by mouth daily.     hydrocortisone 2.5 % cream Use as directed. Instruction in patient handout 30 g 3   Ibuprofen (ADVIL PO) Take by mouth as needed.     ketoconazole (NIZORAL) 2 % cream Use as directed.  Instructions in pt handout (Patient taking differently: daily as needed. Use as directed. Instructions in pt handout) 30 g 3   meloxicam (MOBIC) 7.5 MG tablet Take 1 tablet (7.5 mg total) by mouth daily as needed for pain. 30 tablet 1   metFORMIN (GLUCOPHAGE-XR) 500 MG 24 hr tablet Take 1 tablet (500 mg total) by mouth every evening. 90 tablet 2   Misc Natural Products (ELDERBERRY ZINC/VIT C/IMMUNE MT) Use as directed in the mouth or throat as needed.     Omega 3 1000 MG CAPS Take 2,000 mg by mouth daily.     pantoprazole (PROTONIX) 20 MG tablet TAKE 1 TABLET (20 MG TOTAL) BY MOUTH EVERY MORNING. TAKE 30 MINUTES TO HOUR BEFORE BREAKFAST 90 tablet 1   Probiotic Product (PROBIOTIC ADVANCED PO) Take by mouth.     rosuvastatin (CRESTOR) 5 MG tablet Take 1 tablet (5 mg total) by mouth every evening. 90 tablet 3   No current facility-administered medications on file prior to visit.       Observations/Objective: Home sleep study done on August 16, 2022 showed moderate sleep apnea with AHI at 16.4/hour and SPO2 low at 74%.    Assessment and Plan: Moderate obstructive sleep apnea with significant symptom burden.  We will begin auto CPAP 5 to 15 cm H2O.  Patient education was given - discussed how weight can impact sleep and risk for sleep disordered breathing -  discussed options to assist with weight loss: combination of diet modification, cardiovascular and strength training exercises   - had an extensive discussion regarding the adverse health consequences related to untreated sleep disordered breathing - specifically discussed the risks for hypertension, coronary artery disease, cardiac dysrhythmias, cerebrovascular disease, and diabetes - lifestyle modification discussed   - discussed how sleep disruption can increase risk of accidents, particularly when driving - safe driving practices were discussed   Plan  Patient Instructions  Begin CPAP therapy at bedtime.  Wear all night long.   Goal is to wear for more than 6 hours each night Work on healthy weight loss  Healthy sleep regimen. Do not drive if sleepy Follow-up in 3 months and as needed      Follow Up Instructions:    I discussed the assessment and treatment plan with the patient. The patient was provided an opportunity to ask questions and all were answered. The patient agreed with the plan and demonstrated an understanding of the instructions.   The patient was advised to call back or seek an in-person evaluation if the symptoms worsen or if the condition fails to improve as anticipated.  I provided 20  minutes of non-face-to-face time during this encounter.   Rexene Edison, NP

## 2022-08-26 NOTE — Addendum Note (Signed)
Addended by: Vanessa Barbara on: 08/26/2022 10:12 AM   Modules accepted: Orders

## 2022-08-26 NOTE — Patient Instructions (Signed)
Begin CPAP therapy at bedtime.  Wear all night long.  Goal is to wear for more than 6 hours each night Work on healthy weight loss  Healthy sleep regimen. Do not drive if sleepy Follow-up in 3 months and as needed

## 2022-08-26 NOTE — Progress Notes (Signed)
Reviewed and agree with assessment/plan.   Chesley Mires, MD [facility] Pulmonary/Critical Care 08/26/2022, 12:01 PM Pager:  [phone]

## 2022-09-05 ENCOUNTER — Encounter: Payer: Self-pay | Admitting: Podiatry

## 2022-09-05 ENCOUNTER — Ambulatory Visit (INDEPENDENT_AMBULATORY_CARE_PROVIDER_SITE_OTHER): Payer: BC Managed Care – PPO | Admitting: Podiatry

## 2022-09-05 ENCOUNTER — Ambulatory Visit (INDEPENDENT_AMBULATORY_CARE_PROVIDER_SITE_OTHER): Payer: BC Managed Care – PPO

## 2022-09-05 DIAGNOSIS — M722 Plantar fascial fibromatosis: Secondary | ICD-10-CM

## 2022-09-05 DIAGNOSIS — M62461 Contracture of muscle, right lower leg: Secondary | ICD-10-CM | POA: Diagnosis not present

## 2022-09-05 NOTE — Patient Instructions (Addendum)
Plantar Fasciitis (Heel Spur Syndrome) with Rehab The plantar fascia is a fibrous, ligament-like, soft-tissue structure that spans the bottom of the foot. Plantar fasciitis is a condition that causes pain in the foot due to inflammation of the tissue. SYMPTOMS   Pain and tenderness on the underneath side of the foot.  Pain that worsens with standing or walking. CAUSES  Plantar fasciitis is caused by irritation and injury to the plantar fascia on the underneath side of the foot. Common mechanisms of injury include:  Direct trauma to bottom of the foot.  Damage to a small nerve that runs under the foot where the main fascia attaches to the heel bone.  Stress placed on the plantar fascia due to bone spurs. RISK INCREASES WITH:   Activities that place stress on the plantar fascia (running, jumping, pivoting, or cutting).  Poor strength and flexibility.  Improperly fitted shoes.  Tight calf muscles.  Flat feet.  Failure to warm-up properly before activity.  Obesity. PREVENTION  Warm up and stretch properly before activity.  Allow for adequate recovery between workouts.  Maintain physical fitness:  Strength, flexibility, and endurance.  Cardiovascular fitness.  Maintain a health body weight.  Avoid stress on the plantar fascia.  Wear properly fitted shoes, including arch supports for individuals who have flat feet.  PROGNOSIS  If treated properly, then the symptoms of plantar fasciitis usually resolve without surgery. However, occasionally surgery is necessary.  RELATED COMPLICATIONS   Recurrent symptoms that Mazzuca result in a chronic condition.  Problems of the lower back that are caused by compensating for the injury, such as limping.  Pain or weakness of the foot during push-off following surgery.  Chronic inflammation, scarring, and partial or complete fascia tear, occurring more often from repeated injections.  TREATMENT  Treatment initially involves the  use of ice and medication to help reduce pain and inflammation. The use of strengthening and stretching exercises Littler help reduce pain with activity, especially stretches of the Achilles tendon. These exercises Weidinger be performed at home or with a therapist. Your caregiver Maranan recommend that you use heel cups of arch supports to help reduce stress on the plantar fascia. Occasionally, corticosteroid injections are given to reduce inflammation. If symptoms persist for greater than 6 months despite non-surgical (conservative), then surgery Ludlum be recommended.   MEDICATION   If pain medication is necessary, then nonsteroidal anti-inflammatory medications, such as aspirin and ibuprofen, or other minor pain relievers, such as acetaminophen, are often recommended.  Do not take pain medication within 7 days before surgery.  Prescription pain relievers Cadavid be given if deemed necessary by your caregiver. Use only as directed and only as much as you need.  Corticosteroid injections Jellison be given by your caregiver. These injections should be reserved for the most serious cases, because they Mims only be given a certain number of times.  HEAT AND COLD  Cold treatment (icing) relieves pain and reduces inflammation. Cold treatment should be applied for 10 to 15 minutes every 2 to 3 hours for inflammation and pain and immediately after any activity that aggravates your symptoms. Use ice packs or massage the area with a piece of ice (ice massage).  Heat treatment Rieser be used prior to performing the stretching and strengthening activities prescribed by your caregiver, physical therapist, or athletic trainer. Use a heat pack or soak the injury in warm water.  SEEK IMMEDIATE MEDICAL CARE IF:  Treatment seems to offer no benefit, or the condition worsens.  Any medications   produce adverse side effects.  EXERCISES- RANGE OF MOTION (ROM) AND STRETCHING EXERCISES - Plantar Fasciitis (Heel Spur Syndrome) These exercises  Fugett help you when beginning to rehabilitate your injury. Your symptoms [place] resolve with or without further involvement from your physician, physical therapist or athletic trainer. While completing these exercises, remember:   Restoring tissue flexibility helps normal motion to return to the joints. This allows healthier, less painful movement and activity.  An effective stretch should be held for at least 30 seconds.  A stretch should never be painful. You should only feel a gentle lengthening or release in the stretched tissue.  RANGE OF MOTION - Toe Extension, Flexion  Sit with your right / left leg crossed over your opposite knee.  Grasp your toes and gently pull them back toward the top of your foot. You should feel a stretch on the bottom of your toes and/or foot.  Hold this stretch for 10 seconds.  Now, gently pull your toes toward the bottom of your foot. You should feel a stretch on the top of your toes and or foot.  Hold this stretch for 10 seconds. Repeat  times. Complete this stretch 3 times per day.   RANGE OF MOTION - Ankle Dorsiflexion, Active Assisted  Remove shoes and sit on a chair that is preferably not on a carpeted surface.  Place right / left foot under knee. Extend your opposite leg for support.  Keeping your heel down, slide your right / left foot back toward the chair until you feel a stretch at your ankle or calf. If you do not feel a stretch, slide your bottom forward to the edge of the chair, while still keeping your heel down.  Hold this stretch for 10 seconds. Repeat 3 times. Complete this stretch 2 times per day.   STRETCH  Gastroc, Standing  Place hands on wall.  Extend right / left leg, keeping the front knee somewhat bent.  Slightly point your toes inward on your back foot.  Keeping your right / left heel on the floor and your knee straight, shift your weight toward the wall, not allowing your back to arch.  You should feel a gentle stretch  in the right / left calf. Hold this position for 10 seconds. Repeat 3 times. Complete this stretch 2 times per day.  STRETCH  Soleus, Standing  Place hands on wall.  Extend right / left leg, keeping the other knee somewhat bent.  Slightly point your toes inward on your back foot.  Keep your right / left heel on the floor, bend your back knee, and slightly shift your weight over the back leg so that you feel a gentle stretch deep in your back calf.  Hold this position for 10 seconds. Repeat 3 times. Complete this stretch 2 times per day.  STRETCH  Gastrocsoleus, Standing  Note: This exercise can place a lot of stress on your foot and ankle. Please complete this exercise only if specifically instructed by your caregiver.   Place the ball of your right / left foot on a step, keeping your other foot firmly on the same step.  Hold on to the wall or a rail for balance.  Slowly lift your other foot, allowing your body weight to press your heel down over the edge of the step.  You should feel a stretch in your right / left calf.  Hold this position for 10 seconds.  Repeat this exercise with a slight bend in your right /   left knee. Repeat 3 times. Complete this stretch 2 times per day.   STRENGTHENING EXERCISES - Plantar Fasciitis (Heel Spur Syndrome)  These exercises Mah help you when beginning to rehabilitate your injury. They [place]roege resolve your symptoms with or without further involvement from your physician, physical therapist or athletic trainer. While completing these exercises, remember:   Muscles can gain both the endurance and the strength needed for everyday activities through controlled exercises.  Complete these exercises as instructed by your physician, physical therapist or athletic trainer. Progress the resistance and repetitions only as guided.  STRENGTH - Towel Curls  Sit in a chair positioned on a non-carpeted surface.  Place your foot on a towel, keeping your heel  on the floor.  Pull the towel toward your heel by only curling your toes. Keep your heel on the floor. Repeat 3 times. Complete this exercise 2 times per day.  STRENGTH - Ankle Inversion  Secure one end of a rubber exercise band/tubing to a fixed object (table, pole). Loop the other end around your foot just before your toes.  Place your fists between your knees. This will focus your strengthening at your ankle.  Slowly, pull your big toe up and in, making sure the band/tubing is positioned to resist the entire motion.  Hold this position for 10 seconds.  Have your muscles resist the band/tubing as it slowly pulls your foot back to the starting position. Repeat 3 times. Complete this exercises 2 times per day.  Document Released: 11/21/2005 Document Revised: 02/13/2012 Document Reviewed: 03/05/2009 [employer] Patient Information 2014 [employer].  

## 2022-09-08 NOTE — Progress Notes (Signed)
  Subjective:  Patient ID: Jaime Tate, female    DOB: [birth year],  MRN: [id number]  Chief Complaint  Patient presents with   Foot Pain    "I have pain in my right heel.  My family doctor said it's Plantar Fasciitis." N - heel pain L - plantar right D - June 2023 O - suddenly, gotten worse C -  tender, sore A - excessive walking, barefoot T - wear shoes that have arch, lay with my foot flat, stretch it out    46 y.o. female presents with the above complaint. History confirmed with patient.   Objective:  Physical Exam: warm, good capillary refill, no trophic changes or ulcerative lesions, normal DP and PT pulses, and normal sensory exam. Left Foot: normal exam, no swelling, tenderness, instability; ligaments intact, full range of motion of all ankle/foot joints Right Foot: point tenderness over the heel pad, point tenderness of the mid plantar fascia, and gastrocnemius equinus is noted with a positive silverskiold test   Radiographs: Multiple views x-ray of the right foot: no fracture, dislocation, swelling or degenerative changes noted and plantar calcaneal spur Assessment:   1. Plantar fasciitis, right      Plan:  Patient was evaluated and treated and all questions answered.  Discussed the etiology and treatment options for plantar fasciitis including stretching, formal physical therapy, supportive shoegears such as a running shoe or sneaker, pre fabricated orthoses, injection therapy, and oral medications. We also discussed the role of surgical treatment of this for patients who do not improve after exhausting non-surgical treatment options.   -XR reviewed with patient -Educated patient on stretching and icing of the affected limb -Injection delivered to the plantar fascia of the right foot. -She has Rx for Mobic 7.5 mg as needed -We discussed supporting the medial longitudinal arch with a long-term support such as a custom molded orthoses she will check on her  benefits for this and she will be scheduled for casting for this at her convenience  After sterile prep with povidone-iodine solution and alcohol, the right heel was injected with 0.5cc 2% xylocaine plain, 0.5cc 0.5% marcaine plain, '5mg'$  triamcinolone acetonide, and '2mg'$  dexamethasone was injected along the medial plantar fascia at the insertion on the plantar calcaneus. The patient tolerated the procedure well without complication.   Return for recheck plantar fasciitis.

## 2022-09-17 ENCOUNTER — Other Ambulatory Visit: Payer: Self-pay | Admitting: Family

## 2022-09-17 DIAGNOSIS — M79671 Pain in right foot: Secondary | ICD-10-CM

## 2022-09-19 DIAGNOSIS — G4733 Obstructive sleep apnea (adult) (pediatric): Secondary | ICD-10-CM | POA: Diagnosis not present

## 2022-10-05 ENCOUNTER — Ambulatory Visit (INDEPENDENT_AMBULATORY_CARE_PROVIDER_SITE_OTHER): Payer: BC Managed Care – PPO | Admitting: Podiatry

## 2022-10-05 DIAGNOSIS — M722 Plantar fascial fibromatosis: Secondary | ICD-10-CM | POA: Diagnosis not present

## 2022-10-05 DIAGNOSIS — M62461 Contracture of muscle, right lower leg: Secondary | ICD-10-CM | POA: Diagnosis not present

## 2022-10-05 NOTE — Progress Notes (Signed)
  Subjective:  Patient ID: Jaime Tate, female    DOB: Jul 28, 1976,  MRN: [id number]  Chief Complaint  Patient presents with   Plantar Fasciitis    4 week follow up right foot - doing better    46 y.o. female presents with the above complaint. History confirmed with patient.  So far has had some improvement she is working on her exercises twice a day she is wearing the Vionic inserts which have helped  Objective:  Physical Exam: warm, good capillary refill, no trophic changes or ulcerative lesions, normal DP and PT pulses, and normal sensory exam. Left Foot: normal exam, no swelling, tenderness, instability; ligaments intact, full range of motion of all ankle/foot joints Right Foot: No point tenderness over the heel pad or plantar fascia today, she still has significant gastrocnemius equinus   Radiographs: Multiple views x-ray of the right foot: no fracture, dislocation, swelling or degenerative changes noted and plantar calcaneal spur Assessment:   1. Plantar fasciitis, right   2. Gastrocnemius equinus of right lower extremity      Plan:  Patient was evaluated and treated and all questions answered.  Discussed the etiology and treatment options for plantar fasciitis including stretching, formal physical therapy, supportive shoegears such as a running shoe or sneaker, pre fabricated orthoses, injection therapy, and oral medications. We also discussed the role of surgical treatment of this for patients who do not improve after exhausting non-surgical treatment options.   Overall doing much better.  I do think the equinus still needs to be addressed in a night splint was dispensed to begin to stretch out the tendon further.  We discussed the use of this and rationale.  Could be addressed surgically as well she does have a family history of this her brother tends to walk on the toes.  Advised to continue her home therapy plan as well and she will return to see me as needed.  No  follow-ups on file.

## 2022-10-07 ENCOUNTER — Other Ambulatory Visit: Payer: Self-pay | Admitting: Internal Medicine

## 2022-10-13 ENCOUNTER — Other Ambulatory Visit: Payer: Self-pay | Admitting: Family

## 2022-10-13 DIAGNOSIS — K2100 Gastro-esophageal reflux disease with esophagitis, without bleeding: Secondary | ICD-10-CM

## 2022-10-18 DIAGNOSIS — Z6838 Body mass index (BMI) 38.0-38.9, adult: Secondary | ICD-10-CM | POA: Diagnosis not present

## 2022-10-18 DIAGNOSIS — J09X2 Influenza due to identified novel influenza A virus with other respiratory manifestations: Secondary | ICD-10-CM | POA: Diagnosis not present

## 2022-10-18 DIAGNOSIS — Z1152 Encounter for screening for COVID-19: Secondary | ICD-10-CM | POA: Diagnosis not present

## 2022-10-18 DIAGNOSIS — Z03818 Encounter for observation for suspected exposure to other biological agents ruled out: Secondary | ICD-10-CM | POA: Diagnosis not present

## 2022-10-20 DIAGNOSIS — G4733 Obstructive sleep apnea (adult) (pediatric): Secondary | ICD-10-CM | POA: Diagnosis not present

## 2022-10-25 DIAGNOSIS — G4733 Obstructive sleep apnea (adult) (pediatric): Secondary | ICD-10-CM | POA: Diagnosis not present

## 2022-11-01 ENCOUNTER — Other Ambulatory Visit: Payer: Self-pay | Admitting: Family

## 2022-11-01 DIAGNOSIS — E119 Type 2 diabetes mellitus without complications: Secondary | ICD-10-CM

## 2022-11-04 ENCOUNTER — Telehealth: Payer: Self-pay | Admitting: Nurse Practitioner

## 2022-11-04 DIAGNOSIS — G4733 Obstructive sleep apnea (adult) (pediatric): Secondary | ICD-10-CM

## 2022-11-04 NOTE — Telephone Encounter (Signed)
Called patient and she states that she got her cpap machine and has been using it for the past month but she feels like the air is too much. She states she wakes up in the morning and feeling like she is burping air. Will pull download and give to you.   Please advise

## 2022-11-04 NOTE — Telephone Encounter (Signed)
Called and spoke to patient and went over recommendations from [place]. Patient verbalized understanding. New settings ordered. Nothing further needed

## 2022-11-04 NOTE — Telephone Encounter (Signed)
Called patient and she states that she got her cpap machine and has been using it for the past month but she feels like the air is too much. She states she wakes up in the morning and feeling like she is burping air. Will pull download and give to you.  Please advise

## 2022-11-04 NOTE — Telephone Encounter (Signed)
CPAP download shows great usage keep up the good work.  We can cut down on the pressures to CPAP AutoSet 5 to 13 cm H2O. To see if this would be more comfortable.  Make sure she keeps her follow-up office visit.

## 2022-11-07 ENCOUNTER — Ambulatory Visit: Payer: BC Managed Care – PPO | Admitting: Nurse Practitioner

## 2022-11-19 DIAGNOSIS — G4733 Obstructive sleep apnea (adult) (pediatric): Secondary | ICD-10-CM | POA: Diagnosis not present

## 2022-11-24 DIAGNOSIS — G4733 Obstructive sleep apnea (adult) (pediatric): Secondary | ICD-10-CM | POA: Diagnosis not present

## 2022-12-13 ENCOUNTER — Ambulatory Visit: Payer: BC Managed Care – PPO | Admitting: Adult Health

## 2022-12-13 ENCOUNTER — Encounter: Payer: Self-pay | Admitting: Adult Health

## 2022-12-13 ENCOUNTER — Telehealth: Payer: Self-pay

## 2022-12-13 VITALS — BP 122/78 | HR 75 | Temp 97.9°F | Ht 63.5 in | Wt 222.8 lb

## 2022-12-13 DIAGNOSIS — G4733 Obstructive sleep apnea (adult) (pediatric): Secondary | ICD-10-CM | POA: Insufficient documentation

## 2022-12-13 NOTE — Progress Notes (Signed)
Reviewed and agree with assessment/plan.   Chesley Mires, MD [facility] Pulmonary/Critical Care 12/13/2022, 10:09 AM Pager:  [phone]

## 2022-12-13 NOTE — Patient Instructions (Signed)
Continue on CPAP therapy at bedtime.  Wear all night long.  Goal is to wear for more than 6 hours each night Work on healthy weight loss  Healthy sleep regimen. Do not drive if sleepy Drennon like the Dreamwear full face.  Follow-up in 6 months and as needed

## 2022-12-13 NOTE — Assessment & Plan Note (Signed)
Excellent control and compliance on CPAP -  Will check with DME for CPAP pressure setting -should be auto CPAP 5-13 cmH2O.   Plan  Patient Instructions  Continue on CPAP therapy at bedtime.  Wear all night long.  Goal is to wear for more than 6 hours each night Work on healthy weight loss  Healthy sleep regimen. Do not drive if sleepy Chaviano like the Dreamwear full face.  Follow-up in 6 months and as needed

## 2022-12-13 NOTE — Progress Notes (Signed)
$'@Patient'y$  ID: Abbott Pao Doescher, female    DOB: 1976-09-15, 47 y.o.   MRN: [id number]  Chief Complaint  Patient presents with   Follow-up    Referring provider: Burnard Hawthorne, FNP  HPI: 47 yo female seen for sleep consult 07/05/22 for snoring , daytime sleepiness and Headaches found to have moderate OSA   TEST/EVENTS :  August 16, 2022 that showed moderate sleep apnea with AHI at 16.4/hour and SPO2 low at 74%   12/13/2022 Follow up : OSA Patient presents for 57-monthfollow-up.  Patient was seen last visit after recent home sleep study showing moderate sleep apnea.  She was started on CPAP therapy.  Patient says that she is doing well on CPAP.  She is starting to get adjusted to it.  She did have some problems with higher pressures.  Her CPAP settings were supposed to be decreased to 5 to 13 cm H2O.  She says this has made her feel better.  She has decreased daytime sleepiness and less restlessness at nighttime.  She also has a decrease in frequency of headaches.  Feels that she is benefiting from CPAP.  CPAP download shows excellent compliance with 97% usage.  Daily average usage at 7.5 hours.  Patient is on auto CPAP 5 to 15 cm H2O.  AHI is 4.6/hour. Using full face mask. Could not tolerate nasal mask.     Allergies  Allergen Reactions   Biaxin [Clarithromycin] Diarrhea and Nausea And Vomiting    Immunization History  Administered Date(s) Administered   Influenza,inj,Quad PF,6+ Mos 01/04/2016, 09/12/2016, 10/10/2018, 09/05/2019, 09/27/2020, 08/28/2021   Influenza-Unspecified 09/09/2017, 09/15/2018   PFIZER(Purple Top)SARS-COV-2 Vaccination 02/19/2020, 03/11/2020, 10/09/2020   Pfizer Covid-19 Vaccine Bivalent Booster 153yr& up 08/28/2021   Tdap 10/01/2019    Past Medical History:  Diagnosis Date   Allergy    Seasonal   Anxiety    Chicken pox    GERD (gastroesophageal reflux disease)     Tobacco History: Social History   Tobacco Use  Smoking Status Never   Smokeless Tobacco Never   Counseling given: Not Answered   Outpatient Medications Prior to Visit  Medication Sig Dispense Refill   Acetaminophen (TYLENOL PO) Take by mouth.     Cholecalciferol (VITAMIN D3) 50 MCG (2000 UT) capsule Take 2,000 Units by mouth daily.     escitalopram (LEXAPRO) 10 MG tablet TAKE 1 TABLET BY MOUTH EVERY DAY 90 tablet 2   ferrous sulfate 325 (65 FE) MG EC tablet TAKE 1 TABLET BY MOUTH 2 TIMES DAILY WITH A MEAL. 180 tablet 1   fexofenadine (ALLEGRA) 60 MG tablet Take 60 mg by mouth daily.     hydrocortisone 2.5 % cream Use as directed. Instruction in patient handout 30 g 3   Ibuprofen (ADVIL PO) Take by mouth as needed.     ketoconazole (NIZORAL) 2 % cream Use as directed. Instructions in pt handout (Patient taking differently: daily as needed. Use as directed. Instructions in pt handout) 30 g 3   meloxicam (MOBIC) 7.5 MG tablet TAKE 1 TABLET BY MOUTH DAILY AS NEEDED FOR PAIN 90 tablet 0   metFORMIN (GLUCOPHAGE-XR) 500 MG 24 hr tablet TAKE 1 TABLET BY MOUTH EVERY EVENING. 90 tablet 2   Misc Natural Products (ELDERBERRY ZINC/VIT C/IMMUNE MT) Use as directed in the mouth or throat as needed.     Omega 3 1000 MG CAPS Take 2,000 mg by mouth daily.     pantoprazole (PROTONIX) 20 MG tablet TAKE 1 TABLET (20 MG  TOTAL) BY MOUTH EVERY MORNING. TAKE 30 MINUTES TO HOUR BEFORE BREAKFAST 90 tablet 1   Probiotic Product (PROBIOTIC ADVANCED PO) Take by mouth.     rosuvastatin (CRESTOR) 5 MG tablet Take 1 tablet (5 mg total) by mouth every evening. 90 tablet 3   No facility-administered medications prior to visit.     Review of Systems:   Constitutional:   No  weight loss, night sweats,  Fevers, chills, +fatigue, or  lassitude.  HEENT:   No headaches,  Difficulty swallowing,  Tooth/dental problems, or  Sore throat,                No sneezing, itching, ear ache, nasal congestion, post nasal drip,   CV:  No chest pain,  Orthopnea, PND, swelling in lower extremities,  anasarca, dizziness, palpitations, syncope.   GI  No heartburn, indigestion, abdominal pain, nausea, vomiting, diarrhea, change in bowel habits, loss of appetite, bloody stools.   Resp: No shortness of breath with exertion or at rest.  No excess mucus, no productive cough,  No non-productive cough,  No coughing up of blood.  No change in color of mucus.  No wheezing.  No chest wall deformity  Skin: no rash or lesions.  GU: no dysuria, change in color of urine, no urgency or frequency.  No flank pain, no hematuria   MS:  No joint pain or swelling.  No decreased range of motion.  No back pain.    Physical Exam  BP 122/78 (BP Location: Left Arm, Cuff Size: Normal)   Pulse 75   Temp 97.9 F (36.6 C) (Temporal)   Ht 5' 3.5" (1.613 m)   Wt 222 lb 12.8 oz (101.1 kg)   SpO2 97%   BMI 38.85 kg/m   GEN: A/Ox3; pleasant , NAD, well nourished    HEENT:  [place]/AT, NOSE-clear, THROAT-clear, no lesions, no postnasal drip or exudate noted. Class 2 MP airway   NECK:  Supple w/ fair ROM; no JVD; normal carotid impulses w/o bruits; no thyromegaly or nodules palpated; no lymphadenopathy.    RESP  Clear  P & A; w/o, wheezes/ rales/ or rhonchi. no accessory muscle use, no dullness to percussion  CARD:  RRR, no m/r/g, no peripheral edema, pulses intact, no cyanosis or clubbing.  GI:   Soft & nt; nml bowel sounds; no organomegaly or masses detected.   Musco: Warm bil, no deformities or joint swelling noted.   Neuro: alert, no focal deficits noted.    Skin: Warm, no lesions or rashes    Lab Results:  CBC   BMET   BNP No results found for: "BNP"  ProBNP No results found for: "PROBNP"  Imaging: No results found.        No data to display          No results found for: "NITRICOXIDE"      Assessment & Plan:   OSA (obstructive sleep apnea) Excellent control and compliance on CPAP -  Will check with DME for CPAP pressure setting -should be auto CPAP 5-13 cmH2O.    Plan  Patient Instructions  Continue on CPAP therapy at bedtime.  Wear all night long.  Goal is to wear for more than 6 hours each night Work on healthy weight loss  Healthy sleep regimen. Do not drive if sleepy [place] like the Dreamwear full face.  Follow-up in 6 months and as needed      Rexene Edison, NP 12/13/2022

## 2022-12-13 NOTE — Telephone Encounter (Signed)
Order placed 11/2022 to ajust coao settings to 5-13cm. Airview download states settings are 5-15cm. Patient stated that her machine says 5-13. Lm for Brad with [employer] to verify.

## 2022-12-14 NOTE — Telephone Encounter (Signed)
Spoke to [place] with [employer]. He stated that on 11/09/2022, cpap setting was changed to 5-13cm. He will have cpap team follow up on this to see why download is not reflecting change.

## 2022-12-15 NOTE — Telephone Encounter (Signed)
Spoke to Jaime Tate with [employer]. He stated that [name] with [employer] went in and verified that settings are 5-13cm h2O.  Routing to TP to make aware.

## 2022-12-19 ENCOUNTER — Other Ambulatory Visit: Payer: Self-pay | Admitting: Family

## 2022-12-19 DIAGNOSIS — M79671 Pain in right foot: Secondary | ICD-10-CM

## 2022-12-20 DIAGNOSIS — G4733 Obstructive sleep apnea (adult) (pediatric): Secondary | ICD-10-CM | POA: Diagnosis not present

## 2022-12-25 DIAGNOSIS — G4733 Obstructive sleep apnea (adult) (pediatric): Secondary | ICD-10-CM | POA: Diagnosis not present

## 2023-01-13 ENCOUNTER — Telehealth: Payer: Self-pay

## 2023-01-13 DIAGNOSIS — E119 Type 2 diabetes mellitus without complications: Secondary | ICD-10-CM

## 2023-01-13 NOTE — Telephone Encounter (Signed)
Patient states she has an appointment with Mable Paris, FNP, on 01/18/2023 and she would like to know if she needs to come in for a lab appointment before her visit with Mable Paris, [place].  I do not see a lab order in Epic at this time.

## 2023-01-16 NOTE — Telephone Encounter (Signed)
DONE

## 2023-01-18 ENCOUNTER — Ambulatory Visit: Payer: BC Managed Care – PPO | Admitting: Family

## 2023-01-18 ENCOUNTER — Encounter: Payer: Self-pay | Admitting: Family

## 2023-01-18 VITALS — BP 122/80 | HR 94 | Temp 98.5°F | Ht 64.0 in | Wt 223.8 lb

## 2023-01-18 DIAGNOSIS — U071 COVID-19: Secondary | ICD-10-CM

## 2023-01-18 DIAGNOSIS — E119 Type 2 diabetes mellitus without complications: Secondary | ICD-10-CM | POA: Diagnosis not present

## 2023-01-18 DIAGNOSIS — Z8639 Personal history of other endocrine, nutritional and metabolic disease: Secondary | ICD-10-CM | POA: Diagnosis not present

## 2023-01-18 DIAGNOSIS — N393 Stress incontinence (female) (male): Secondary | ICD-10-CM | POA: Diagnosis not present

## 2023-01-18 DIAGNOSIS — R7309 Other abnormal glucose: Secondary | ICD-10-CM

## 2023-01-18 DIAGNOSIS — J029 Acute pharyngitis, unspecified: Secondary | ICD-10-CM | POA: Diagnosis not present

## 2023-01-18 LAB — POCT GLYCOSYLATED HEMOGLOBIN (HGB A1C): Hemoglobin A1C: 6.1 % — AB (ref 4.0–5.6)

## 2023-01-18 LAB — COMPREHENSIVE METABOLIC PANEL
ALT: 13 U/L (ref 0–35)
AST: 15 U/L (ref 0–37)
Albumin: 4.2 g/dL (ref 3.5–5.2)
Alkaline Phosphatase: 86 U/L (ref 39–117)
BUN: 13 mg/dL (ref 6–23)
CO2: 24 mEq/L (ref 19–32)
Calcium: 9.5 mg/dL (ref 8.4–10.5)
Chloride: 105 mEq/L (ref 96–112)
Creatinine, Ser: 0.73 mg/dL (ref 0.40–1.20)
GFR: 98.8 mL/min (ref >60.00)
Glucose, Bld: 107 mg/dL — ABNORMAL HIGH (ref 70–99)
Potassium: 3.8 mEq/L (ref 3.5–5.1)
Sodium: 137 mEq/L (ref 135–145)
Total Bilirubin: 0.3 mg/dL (ref 0.2–1.2)
Total Protein: 7.5 g/dL (ref 6.0–8.3)

## 2023-01-18 LAB — URINALYSIS, ROUTINE W REFLEX MICROSCOPIC
Bilirubin Urine: NEGATIVE
Hgb urine dipstick: NEGATIVE
Ketones, ur: NEGATIVE
Leukocytes,Ua: NEGATIVE
Nitrite: NEGATIVE
Specific Gravity, Urine: >=1.03 — AB (ref 1.000–1.030)
Total Protein, Urine: NEGATIVE
Urine Glucose: NEGATIVE
Urobilinogen, UA: 0.2 (ref 0.0–1.0)
pH: 5.5 (ref 5.0–8.0)

## 2023-01-18 LAB — POCT RAPID STREP A (OFFICE): Rapid Strep A Screen: NEGATIVE

## 2023-01-18 LAB — MICROALBUMIN / CREATININE URINE RATIO
Creatinine,U: 171 mg/dL
Microalb Creat Ratio: 0.7 mg/g (ref 0.0–30.0)
Microalb, Ur: 1.3 mg/dL (ref 0.0–1.9)

## 2023-01-18 LAB — LIPID PANEL
Cholesterol: 130 mg/dL (ref 0–200)
HDL: 56.5 mg/dL (ref >39.00)
LDL Cholesterol: 49 mg/dL (ref 0–99)
NonHDL: 73.08
Total CHOL/HDL Ratio: 2
Triglycerides: 122 mg/dL (ref 0.0–149.0)
VLDL: 24.4 mg/dL (ref 0.0–40.0)

## 2023-01-18 LAB — POC COVID19 BINAXNOW: SARS Coronavirus 2 Ag: POSITIVE — AB

## 2023-01-18 LAB — VITAMIN D 25 HYDROXY (VIT D DEFICIENCY, FRACTURES): VITD: 41.98 ng/mL (ref 30.00–100.00)

## 2023-01-18 MED ORDER — NIRMATRELVIR/RITONAVIR (PAXLOVID)TABLET: 3.0000 | ORAL_TABLET | Freq: Two times a day (BID) | ORAL | 0 refills | Status: AC

## 2023-01-18 NOTE — Assessment & Plan Note (Signed)
Symptoms of stress incontinence.  Pending urinalysis, pelvic floor PT.

## 2023-01-18 NOTE — Assessment & Plan Note (Addendum)
Positive for COVID.  Strep is negative. No acute respiratory distress. Counseled on lacking long term safely and effectiveness data of medication, Paxlovid. Explained EUA for Paxlovid. Criteria met for consideration of Paxlovid,  patient older than 12 years and weight > 40kg, started within 5 days of symptom onset and risk factor for severe disease include: DM Start Paxlovid. I have sent patient mychart message as she left the office prior to covid test being resulted.

## 2023-01-18 NOTE — Assessment & Plan Note (Signed)
Lab Results  Component Value Date   HGBA1C 6.1 (A) 01/18/2023   Excellent control.  Continue metformin 500 mg once daily.

## 2023-01-18 NOTE — Progress Notes (Signed)
Assessment & Plan:  Diabetes mellitus without complication [facility]) Assessment & Plan: Lab Results  Component Value Date   HGBA1C 6.1 (A) 01/18/2023   Excellent control.  Continue metformin 500 mg once daily.  Orders: -     Comprehensive metabolic panel -     Lipid panel -     Microalbumin / creatinine urine ratio  Elevated glucose -     POCT glycosylated hemoglobin (Hb A1C)  History of vitamin D deficiency -     VITAMIN D 25 Hydroxy (Vit-D Deficiency, Fractures)  Stress incontinence Assessment & Plan: Symptoms of stress incontinence.  Pending urinalysis, pelvic floor PT.   Orders: -     Ambulatory referral to Physical Therapy -     Urinalysis, Routine w reflex microscopic  COVID-19 Assessment & Plan: Positive for COVID.  Strep is negative. No acute respiratory distress. Counseled on lacking long term safely and effectiveness data of medication, Paxlovid. Explained EUA for Paxlovid. Criteria met for consideration of Paxlovid,  patient older than 12 years and weight > 40kg, started within 5 days of symptom onset and risk factor for severe disease include: DM Start Paxlovid. I have sent patient mychart message as she left the office prior to covid test being resulted.    Orders: -     nirmatrelvir/ritonavir; Take 3 tablets by mouth 2 (two) times daily for 5 days. (Take nirmatrelvir 150 mg two tablets twice daily for 5 days and ritonavir 100 mg one tablet twice daily for 5 days) Patient GFR is 99  Dispense: 30 tablet; Refill: 0  Sore throat -     POC COVID-19 BinaxNow -     POCT rapid strep A     Return precautions given.   Risks, benefits, and alternatives of the medications and treatment plan prescribed today were discussed, and patient expressed understanding.   Education regarding symptom management and diagnosis given to patient on AVS either electronically or printed.  No follow-ups on file.  Mable Paris, FNP  Subjective:    Patient ID: Jaime Pao  Tate, female    DOB: 12/25/1975, 47 y.o.   MRN: [id number]  CC: Jaime Tate is a 47 y.o. female who presents today for follow up.   HPI: Complains of congestion, sore throat x 4 days Feels lymph nodes are sore.  Taking mucinex DM at night, dayquil the  Covid negative last night.  No fever, sob, wheezing.   H/o seasonal allergies. She is taking allegra.   Complains of urinary incontinence over the past year with sneezing, jumping and coughing  No hematuria, dysuria, pelvic pain, dyspareunia     Allergies: Biaxin [clarithromycin] Current Outpatient Medications on File Prior to Visit  Medication Sig Dispense Refill   Acetaminophen (TYLENOL PO) Take by mouth.     Cholecalciferol (VITAMIN D3) 50 MCG (2000 UT) capsule Take 2,000 Units by mouth daily.     escitalopram (LEXAPRO) 10 MG tablet TAKE 1 TABLET BY MOUTH EVERY DAY 90 tablet 2   ferrous sulfate 325 (65 FE) MG EC tablet TAKE 1 TABLET BY MOUTH 2 TIMES DAILY WITH A MEAL. 180 tablet 1   fexofenadine (ALLEGRA) 60 MG tablet Take 60 mg by mouth daily.     hydrocortisone 2.5 % cream Use as directed. Instruction in patient handout 30 g 3   Ibuprofen (ADVIL PO) Take by mouth as needed.     meloxicam (MOBIC) 7.5 MG tablet TAKE 1 TABLET BY MOUTH EVERY DAY AS NEEDED FOR PAIN 90 tablet 0  metFORMIN (GLUCOPHAGE-XR) 500 MG 24 hr tablet TAKE 1 TABLET BY MOUTH EVERY EVENING. 90 tablet 2   Misc Natural Products (ELDERBERRY ZINC/VIT C/IMMUNE MT) Use as directed in the mouth or throat as needed.     Omega 3 1000 MG CAPS Take 2,000 mg by mouth daily.     pantoprazole (PROTONIX) 20 MG tablet TAKE 1 TABLET (20 MG TOTAL) BY MOUTH EVERY MORNING. TAKE 30 MINUTES TO HOUR BEFORE BREAKFAST 90 tablet 1   Probiotic Product (PROBIOTIC ADVANCED PO) Take by mouth.     rosuvastatin (CRESTOR) 5 MG tablet Take 1 tablet (5 mg total) by mouth every evening. 90 tablet 3   No current facility-administered medications on file prior to visit.    Review of Systems   Constitutional:  Negative for chills and fever.  HENT:  Positive for congestion.   Respiratory:  Positive for cough. Negative for shortness of breath.   Cardiovascular:  Negative for chest pain and palpitations.  Gastrointestinal:  Negative for nausea and vomiting.      Objective:    BP 122/80   Pulse 94   Temp 98.5 F (36.9 C) (Oral)   Ht 5' 4"$  (1.626 m)   Wt 223 lb 12.8 oz (101.5 kg)   LMP  (LMP Unknown)   SpO2 97%   BMI 38.42 kg/m  BP Readings from Last 3 Encounters:  01/18/23 122/80  12/13/22 122/78  07/18/22 126/68   Wt Readings from Last 3 Encounters:  01/18/23 223 lb 12.8 oz (101.5 kg)  12/13/22 222 lb 12.8 oz (101.1 kg)  07/18/22 215 lb 9.6 oz (97.8 kg)    Physical Exam Vitals reviewed.  Constitutional:      Appearance: She is well-developed.  HENT:     Head: Normocephalic and atraumatic.     Right Ear: Hearing, tympanic membrane, ear canal and external ear normal. No decreased hearing noted. No drainage, swelling or tenderness. No middle ear effusion. No foreign body. Tympanic membrane is not erythematous or bulging.     Left Ear: Hearing, tympanic membrane, ear canal and external ear normal. No decreased hearing noted. No drainage, swelling or tenderness.  No middle ear effusion. No foreign body. Tympanic membrane is not erythematous or bulging.     Nose: Nose normal. No rhinorrhea.     Right Sinus: No maxillary sinus tenderness or frontal sinus tenderness.     Left Sinus: No maxillary sinus tenderness or frontal sinus tenderness.     Mouth/Throat:     Pharynx: Uvula midline. No oropharyngeal exudate or posterior oropharyngeal erythema.     Tonsils: No tonsillar abscesses.  Eyes:     Conjunctiva/sclera: Conjunctivae normal.  Cardiovascular:     Rate and Rhythm: Regular rhythm.     Pulses: Normal pulses.     Heart sounds: Normal heart sounds.  Pulmonary:     Effort: Pulmonary effort is normal.     Breath sounds: Normal breath sounds. No wheezing, rhonchi  or rales.  Lymphadenopathy:     Head:     Right side of head: No submental, submandibular, tonsillar, preauricular, posterior auricular or occipital adenopathy.     Left side of head: No submental, submandibular, tonsillar, preauricular, posterior auricular or occipital adenopathy.     Cervical: No cervical adenopathy.  Skin:    General: Skin is warm and dry.  Neurological:     Mental Status: She is alert.  Psychiatric:        Speech: Speech normal.        Behavior:  Behavior normal.        Thought Content: Thought content normal.

## 2023-01-20 DIAGNOSIS — G4733 Obstructive sleep apnea (adult) (pediatric): Secondary | ICD-10-CM | POA: Diagnosis not present

## 2023-01-23 DIAGNOSIS — G4733 Obstructive sleep apnea (adult) (pediatric): Secondary | ICD-10-CM | POA: Diagnosis not present

## 2023-01-25 DIAGNOSIS — G4733 Obstructive sleep apnea (adult) (pediatric): Secondary | ICD-10-CM | POA: Diagnosis not present

## 2023-01-27 ENCOUNTER — Other Ambulatory Visit: Payer: Self-pay | Admitting: Family

## 2023-01-27 DIAGNOSIS — E785 Hyperlipidemia, unspecified: Secondary | ICD-10-CM

## 2023-02-15 ENCOUNTER — Encounter: Payer: Self-pay | Admitting: Family

## 2023-02-15 ENCOUNTER — Ambulatory Visit (INDEPENDENT_AMBULATORY_CARE_PROVIDER_SITE_OTHER): Payer: BC Managed Care – PPO | Admitting: Family

## 2023-02-15 VITALS — BP 128/86 | HR 95 | Temp 99.5°F | Ht 64.0 in | Wt 227.1 lb

## 2023-02-15 DIAGNOSIS — Z1231 Encounter for screening mammogram for malignant neoplasm of breast: Secondary | ICD-10-CM | POA: Diagnosis not present

## 2023-02-15 DIAGNOSIS — Z0000 Encounter for general adult medical examination without abnormal findings: Secondary | ICD-10-CM | POA: Diagnosis not present

## 2023-02-15 NOTE — Assessment & Plan Note (Signed)
Clinical breast exam performed today.  Deferred pelvic exam in the absence of complaints and Pap smear is up-to-date.  Patient will schedule mammogram.

## 2023-02-15 NOTE — Patient Instructions (Addendum)
For [facility] physical therapy, call below.   [facility] [place], [place] [address]  Nice to see you!  Health Maintenance, Female Adopting a healthy lifestyle and getting preventive care are important in promoting health and wellness. Ask your health care provider about: The right schedule for you to have regular tests and exams. Things you can do on your own to prevent diseases and keep yourself healthy. What should I know about diet, weight, and exercise? Eat a healthy diet  Eat a diet that includes plenty of vegetables, fruits, low-fat dairy products, and lean protein. Do not eat a lot of foods that are high in solid fats, added sugars, or sodium. Maintain a healthy weight Body mass index (BMI) is used to identify weight problems. It estimates body fat based on height and weight. Your health care provider can help determine your BMI and help you achieve or maintain a healthy weight. Get regular exercise Get regular exercise. This is one of the most important things you can do for your health. Most adults should: Exercise for at least 150 minutes each week. The exercise should increase your heart rate and make you sweat (moderate-intensity exercise). Do strengthening exercises at least twice a week. This is in addition to the moderate-intensity exercise. Spend less time sitting. Even light physical activity can be beneficial. Watch cholesterol and blood lipids Have your blood tested for lipids and cholesterol at 47 years of age, then have this test every 5 years. Have your cholesterol levels checked more often if: Your lipid or cholesterol levels are high. You are older than 47 years of age. You are at high risk for heart disease. What should I know about cancer screening? Depending on your health history and family history, you Paro need to have cancer screening at various ages. This [place] include screening  for: Breast cancer. Cervical cancer. Colorectal cancer. Skin cancer. Lung cancer. What should I know about heart disease, diabetes, and high blood pressure? Blood pressure and heart disease High blood pressure causes heart disease and increases the risk of stroke. This is more likely to develop in people who have high blood pressure readings or are overweight. Have your blood pressure checked: Every 3-5 years if you are 34-67 years of age. Every year if you are 6 years old or older. Diabetes Have regular diabetes screenings. This checks your fasting blood sugar level. Have the screening done: Once every three years after age 12 if you are at a normal weight and have a low risk for diabetes. More often and at a younger age if you are overweight or have a high risk for diabetes. What should I know about preventing infection? Hepatitis B If you have a higher risk for hepatitis B, you should be screened for this virus. Talk with your health care provider to find out if you are at risk for hepatitis B infection. Hepatitis C Testing is recommended for: Everyone born from [birth year] through 1965. Anyone with known risk factors for hepatitis C. Sexually transmitted infections (STIs) Get screened for STIs, including gonorrhea and chlamydia, if: You are sexually active and are younger than 47 years of age. You are older than 47 years of age and your health care provider tells you that you are at risk for this type of infection. Your sexual activity has changed since you were last screened, and you are at increased risk for chlamydia or gonorrhea. Ask your health care provider if you  are at risk. Ask your health care provider about whether you are at high risk for HIV. Your health care provider Carino recommend a prescription medicine to help prevent HIV infection. If you choose to take medicine to prevent HIV, you should first get tested for HIV. You should then be tested every 3 months for as long as you  are taking the medicine. Pregnancy If you are about to stop having your period (premenopausal) and you Snethen become pregnant, seek counseling before you get pregnant. Take 400 to 800 micrograms (mcg) of folic acid every day if you become pregnant. Ask for birth control (contraception) if you want to prevent pregnancy. Osteoporosis and menopause Osteoporosis is a disease in which the bones lose minerals and strength with aging. This can result in bone fractures. If you are 64 years old or older, or if you are at risk for osteoporosis and fractures, ask your health care provider if you should: Be screened for bone loss. Take a calcium or vitamin D supplement to lower your risk of fractures. Be given hormone replacement therapy (HRT) to treat symptoms of menopause. Follow these instructions at home: Alcohol use Do not drink alcohol if: Your health care provider tells you not to drink. You are pregnant, Iacovelli be pregnant, or are planning to become pregnant. If you drink alcohol: Limit how much you have to: 0-1 drink a day. Know how much alcohol is in your drink. In the [place] one drink equals one 12 oz bottle of beer (355 mL), one 5 oz glass of wine (148 mL), or one 1 oz glass of hard liquor (44 mL). Lifestyle Do not use any products that contain nicotine or tobacco. These products include cigarettes, chewing tobacco, and vaping devices, such as e-cigarettes. If you need help quitting, ask your health care provider. Do not use street drugs. Do not share needles. Ask your health care provider for help if you need support or information about quitting drugs. General instructions Schedule regular health, dental, and eye exams. Stay current with your vaccines. Tell your health care provider if: You often feel depressed. You have ever been abused or do not feel safe at home. Summary Adopting a healthy lifestyle and getting preventive care are important in promoting health and wellness. Follow your  health care provider's instructions about healthy diet, exercising, and getting tested or screened for diseases. Follow your health care provider's instructions on monitoring your cholesterol and blood pressure. This information is not intended to replace advice given to you by your health care provider. Make sure you discuss any questions you have with your health care provider. Document Revised: 04/12/2021 Document Reviewed: 04/12/2021 Elsevier Patient Education  [employer].

## 2023-02-15 NOTE — Progress Notes (Signed)
Assessment & Plan:  Routine general medical examination at a health care facility Assessment & Plan: Clinical breast exam performed today.  Deferred pelvic exam in the absence of complaints and Pap smear is up-to-date.  Patient will schedule mammogram.   Encounter for screening mammogram for malignant neoplasm of breast -     3D Screening Mammogram, Left and Right; Future     Return precautions given.   Risks, benefits, and alternatives of the medications and treatment plan prescribed today were discussed, and patient expressed understanding.   Education regarding symptom management and diagnosis given to patient on AVS either electronically or printed.  Return in about 6 months (around 08/18/2023).  Mable Paris, FNP  Subjective:    Patient ID: Abbott Pao Allard, female    DOB: [birth year], 47 y.o.   MRN: [id number]  CC: Briniya Langel Smead is a 47 y.o. female who presents today for physical exam.    HPI: Feels well today.  No new complaints.  She follows annually with Dermatology  Colorectal Cancer Screening: UTD, 04/05/2022, 10-year repeat Breast Cancer Screening: Mammogram UTD Cervical Cancer Screening: UTD, negative HPV, malignancy 02/26/2021.  Menses are generally monthly approx 28 days, occasionally Schreck extend to 45 days.  No pelvic pain or large blood clots        Tetanus - utd  Alcohol use: Occasionally Smoking/tobacco use: Nonsmoker.    Health Maintenance  Topic Date Due   COVID-19 Vaccine (5 - 2023-24 season) 08/05/2022   OPHTHALMOLOGY EXAM  01/05/2023   INFLUENZA VACCINE  03/05/2023 (Originally 07/05/2022)   HEMOGLOBIN A1C  07/19/2023   Diabetic kidney evaluation - eGFR measurement  01/19/2024   Diabetic kidney evaluation - Urine ACR  01/19/2024   FOOT EXAM  01/19/2024   PAP SMEAR-Modifier  02/27/2024   DTaP/Tdap/Td (2 - Td or Tdap) 09/30/2029   COLONOSCOPY (Pts 45-73yr Insurance coverage will need to be confirmed)  04/05/2032   Hepatitis C Screening   Completed   HIV Screening  Completed   HPV VACCINES  Aged Out    ALLERGIES: Biaxin [clarithromycin]  Current Outpatient Medications on File Prior to Visit  Medication Sig Dispense Refill   Acetaminophen (TYLENOL PO) Take by mouth.     Cholecalciferol (VITAMIN D3) 50 MCG (2000 UT) capsule Take 2,000 Units by mouth daily.     escitalopram (LEXAPRO) 10 MG tablet TAKE 1 TABLET BY MOUTH EVERY DAY 90 tablet 2   fexofenadine (ALLEGRA) 60 MG tablet Take 60 mg by mouth daily.     hydrocortisone 2.5 % cream Use as directed. Instruction in patient handout 30 g 3   Ibuprofen (ADVIL PO) Take by mouth as needed.     meloxicam (MOBIC) 7.5 MG tablet TAKE 1 TABLET BY MOUTH EVERY DAY AS NEEDED FOR PAIN 90 tablet 0   metFORMIN (GLUCOPHAGE-XR) 500 MG 24 hr tablet TAKE 1 TABLET BY MOUTH EVERY EVENING. 90 tablet 2   Misc Natural Products (ELDERBERRY ZINC/VIT C/IMMUNE MT) Use as directed in the mouth or throat as needed.     Omega 3 1000 MG CAPS Take 2,000 mg by mouth daily.     pantoprazole (PROTONIX) 20 MG tablet TAKE 1 TABLET (20 MG TOTAL) BY MOUTH EVERY MORNING. TAKE 30 MINUTES TO HOUR BEFORE BREAKFAST 90 tablet 1   Probiotic Product (PROBIOTIC ADVANCED PO) Take by mouth.     rosuvastatin (CRESTOR) 5 MG tablet TAKE 1 TABLET BY MOUTH EVERY DAY IN THE EVENING 90 tablet 3   No current facility-administered medications on file  prior to visit.    Review of Systems  Constitutional:  Negative for chills, fever and unexpected weight change.  HENT:  Negative for congestion.   Respiratory:  Negative for cough.   Cardiovascular:  Negative for chest pain, palpitations and leg swelling.  Gastrointestinal:  Negative for nausea and vomiting.  Genitourinary:  Negative for pelvic pain.  Musculoskeletal:  Negative for arthralgias and myalgias.  Skin:  Negative for rash.  Neurological:  Negative for headaches.  Hematological:  Negative for adenopathy.  Psychiatric/Behavioral:  Negative for confusion.        Objective:    BP 128/86   Pulse 95   Temp 99.5 F (37.5 C) (Oral)   Ht '5\' 4"'$  (1.626 m)   Wt 227 lb 1.9 oz (103 kg)   LMP  (LMP Unknown)   SpO2 98%   BMI 38.99 kg/m   BP Readings from Last 3 Encounters:  02/15/23 128/86  01/18/23 122/80  12/13/22 122/78   Wt Readings from Last 3 Encounters:  02/15/23 227 lb 1.9 oz (103 kg)  01/18/23 223 lb 12.8 oz (101.5 kg)  12/13/22 222 lb 12.8 oz (101.1 kg)    Physical Exam Vitals reviewed.  Constitutional:      Appearance: Normal appearance. She is well-developed.  Eyes:     Conjunctiva/sclera: Conjunctivae normal.  Neck:     Thyroid: No thyroid mass or thyromegaly.  Cardiovascular:     Rate and Rhythm: Normal rate and regular rhythm.     Pulses: Normal pulses.     Heart sounds: Normal heart sounds.  Pulmonary:     Effort: Pulmonary effort is normal.     Breath sounds: Normal breath sounds. No wheezing, rhonchi or rales.  Chest:  Breasts:    Breasts are symmetrical.     Right: No inverted nipple, mass, nipple discharge, skin change or tenderness.     Left: No inverted nipple, mass, nipple discharge, skin change or tenderness.  Abdominal:     General: Bowel sounds are normal. There is no distension.     Palpations: Abdomen is soft. Abdomen is not rigid. There is no fluid wave or mass.     Tenderness: There is no abdominal tenderness. There is no guarding or rebound.  Lymphadenopathy:     Head:     Right side of head: No submental, submandibular, tonsillar, preauricular, posterior auricular or occipital adenopathy.     Left side of head: No submental, submandibular, tonsillar, preauricular, posterior auricular or occipital adenopathy.     Cervical: No cervical adenopathy.     Right cervical: No superficial, deep or posterior cervical adenopathy.    Left cervical: No superficial, deep or posterior cervical adenopathy.  Skin:    General: Skin is warm and dry.  Neurological:     Mental Status: She is alert.  Psychiatric:         Speech: Speech normal.        Behavior: Behavior normal.        Thought Content: Thought content normal.

## 2023-02-18 DIAGNOSIS — G4733 Obstructive sleep apnea (adult) (pediatric): Secondary | ICD-10-CM | POA: Diagnosis not present

## 2023-02-21 DIAGNOSIS — G4733 Obstructive sleep apnea (adult) (pediatric): Secondary | ICD-10-CM | POA: Diagnosis not present

## 2023-03-01 DIAGNOSIS — E119 Type 2 diabetes mellitus without complications: Secondary | ICD-10-CM | POA: Diagnosis not present

## 2023-03-21 DIAGNOSIS — G4733 Obstructive sleep apnea (adult) (pediatric): Secondary | ICD-10-CM | POA: Diagnosis not present

## 2023-03-22 ENCOUNTER — Other Ambulatory Visit: Payer: Self-pay | Admitting: Family

## 2023-03-22 DIAGNOSIS — M79671 Pain in right foot: Secondary | ICD-10-CM

## 2023-03-24 DIAGNOSIS — G4733 Obstructive sleep apnea (adult) (pediatric): Secondary | ICD-10-CM | POA: Diagnosis not present

## 2023-04-07 ENCOUNTER — Other Ambulatory Visit: Payer: Self-pay | Admitting: Family

## 2023-04-07 DIAGNOSIS — K2100 Gastro-esophageal reflux disease with esophagitis, without bleeding: Secondary | ICD-10-CM

## 2023-04-11 ENCOUNTER — Ambulatory Visit: Payer: BC Managed Care – PPO | Admitting: Nurse Practitioner

## 2023-04-11 ENCOUNTER — Encounter: Payer: Self-pay | Admitting: Nurse Practitioner

## 2023-04-11 VITALS — BP 128/84 | HR 93 | Temp 98.5°F | Ht 64.0 in | Wt 232.6 lb

## 2023-04-11 DIAGNOSIS — J0140 Acute pansinusitis, unspecified: Secondary | ICD-10-CM

## 2023-04-11 MED ORDER — AMOXICILLIN-POT CLAVULANATE 875-125 MG PO TABS: 1.0000 | ORAL_TABLET | Freq: Two times a day (BID) | ORAL | 0 refills | Status: DC

## 2023-04-11 MED ORDER — FLUCONAZOLE 150 MG PO TABS: 150.0000 mg | ORAL_TABLET | Freq: Every day | ORAL | 0 refills | Status: DC

## 2023-04-11 MED ORDER — PREDNISONE 20 MG PO TABS: 20.0000 mg | ORAL_TABLET | Freq: Every day | ORAL | 0 refills | Status: DC

## 2023-04-11 NOTE — Patient Instructions (Addendum)
Started on Augmentin and Prednisone. Diflucan sent to the pharmacy. Advised to increase fluid intake and use steam and humidifier.     Sinus Infection, Adult A sinus infection, also called sinusitis, is inflammation of your sinuses. Sinuses are hollow spaces in the bones around your face. Your sinuses are located: Around your eyes. In the middle of your forehead. Behind your nose. In your cheekbones. Mucus normally drains out of your sinuses. When your nasal tissues become inflamed or swollen, mucus can become trapped or blocked. This allows bacteria, viruses, and fungi to grow, which leads to infection. Most infections of the sinuses are caused by a virus. A sinus infection can develop quickly. It can last for up to 4 weeks (acute) or for more than 12 weeks (chronic). A sinus infection often develops after a cold. What are the causes? This condition is caused by anything that creates swelling in the sinuses or stops mucus from draining. This includes: Allergies. Asthma. Infection from bacteria or viruses. Deformities or blockages in your nose or sinuses. Abnormal growths in the nose (nasal polyps). Pollutants, such as chemicals or irritants in the air. Infection from fungi. This is rare. What increases the risk? You are more likely to develop this condition if you: Have a weak body defense system (immune system). Do a lot of swimming or diving. Overuse nasal sprays. Smoke. What are the signs or symptoms? The main symptoms of this condition are pain and a feeling of pressure around the affected sinuses. Other symptoms include: Stuffy nose or congestion that makes it difficult to breathe through your nose. Thick yellow or greenish drainage from your nose. Tenderness, swelling, and warmth over the affected sinuses. A cough that Schubring get worse at night. Decreased sense of smell and taste. Extra mucus that collects in the throat or the back of the nose (postnasal drip) causing a sore  throat or bad breath. Tiredness (fatigue). Fever. How is this diagnosed? This condition is diagnosed based on: Your symptoms. Your medical history. A physical exam. Tests to find out if your condition is acute or chronic. This Sarver include: Checking your nose for nasal polyps. Viewing your sinuses using a device that has a light (endoscope). Testing for allergies or bacteria. Imaging tests, such as an MRI or CT scan. In rare cases, a bone biopsy Skeels be done to rule out more serious types of fungal sinus disease. How is this treated? Treatment for a sinus infection depends on the cause and whether your condition is chronic or acute. If caused by a virus, your symptoms should go away on their own within 10 days. You Sprunger be given medicines to relieve symptoms. They include: Medicines that shrink swollen nasal passages (decongestants). A spray that eases inflammation of the nostrils (topical intranasal corticosteroids). Rinses that help get rid of thick mucus in your nose (nasal saline washes). Medicines that treat allergies (antihistamines). Over-the-counter pain relievers. If caused by bacteria, your health care provider Lepkowski recommend waiting to see if your symptoms improve. Most bacterial infections will get better without antibiotic medicine. You Aulds be given antibiotics if you have: A severe infection. A weak immune system. If caused by narrow nasal passages or nasal polyps, surgery Carrender be needed. Follow these instructions at home: Medicines Take, use, or apply over-the-counter and prescription medicines only as told by your health care provider. These Sellick include nasal sprays. If you were prescribed an antibiotic medicine, take it as told by your health care provider. Do not stop taking the antibiotic  even if you start to feel better. Hydrate and humidify  Drink enough fluid to keep your urine pale yellow. Staying hydrated will help to thin your mucus. Use a cool mist humidifier to  keep the humidity level in your home above 50%. Inhale steam for 10-15 minutes, 3-4 times a day, or as told by your health care provider. You can do this in the bathroom while a hot shower is running. Limit your exposure to cool or dry air. Rest Rest as much as possible. Sleep with your head raised (elevated). Make sure you get enough sleep each night. General instructions  Apply a warm, moist washcloth to your face 3-4 times a day or as told by your health care provider. This will help with discomfort. Use nasal saline washes as often as told by your health care provider. Wash your hands often with soap and water to reduce your exposure to germs. If soap and water are not available, use hand sanitizer. Do not smoke. Avoid being around people who are smoking (secondhand smoke). Keep all follow-up visits. This is important. Contact a health care provider if: You have a fever. Your symptoms get worse. Your symptoms do not improve within 10 days. Get help right away if: You have a severe headache. You have persistent vomiting. You have severe pain or swelling around your face or eyes. You have vision problems. You develop confusion. Your neck is stiff. You have trouble breathing. These symptoms Scherzer be an emergency. Get help right away. Call 911. Do not wait to see if the symptoms will go away. Do not drive yourself to the hospital. Summary A sinus infection is soreness and inflammation of your sinuses. Sinuses are hollow spaces in the bones around your face. This condition is caused by nasal tissues that become inflamed or swollen. The swelling traps or blocks the flow of mucus. This allows bacteria, viruses, and fungi to grow, which leads to infection. If you were prescribed an antibiotic medicine, take it as told by your health care provider. Do not stop taking the antibiotic even if you start to feel better. Keep all follow-up visits. This is important. This information is not  intended to replace advice given to you by your health care provider. Make sure you discuss any questions you have with your health care provider. Document Revised: 10/26/2021 Document Reviewed: 10/26/2021 Elsevier Patient Education  2023 [employer].

## 2023-04-11 NOTE — Assessment & Plan Note (Addendum)
Started on Augmentin and prednisone. Also sent Diflucan due to h/o yeast infection while on antibiotic, patient requested. Advised to increase fluid intake and use humidifier or steam. If symptoms not improving call the office back for further evaluation.

## 2023-04-11 NOTE — Progress Notes (Signed)
Established Patient Office Visit  Subjective:  Patient ID: Jaime Tate, female    DOB: 30-Amspacher-1977  Age: 47 y.o. MRN: [id number]  CC:  Chief Complaint  Patient presents with   Sinus Problem    Last 3 weeks    HPI  Jaime Tate presents for sinus problem.   Sinus Problem The current episode started 1 to 4 weeks ago. The problem has been gradually worsening since onset. There has been no fever. Associated symptoms include congestion, coughing, ear pain, headaches, sinus pressure and a sore throat. Pertinent negatives include no shortness of breath. Past treatments include nasal decongestants. The treatment provided mild relief.     Past Medical History:  Diagnosis Date   Allergy    Seasonal   Anxiety    Chicken pox    GERD (gastroesophageal reflux disease)    Vaginal delivery     Past Surgical History:  Procedure Laterality Date   COLONOSCOPY WITH PROPOFOL N/A 04/05/2022   Procedure: COLONOSCOPY WITH PROPOFOL;  Surgeon: Midge Minium, MD;  Location: [facility] ENDOSCOPY;  Service: Endoscopy;  Laterality: N/A;   ESOPHAGOGASTRODUODENOSCOPY (EGD) WITH PROPOFOL      Family History  Problem Relation Age of Onset   Arthritis Mother    Osteoporosis Mother    High Cholesterol Mother    Heart disease Father 40   Hypertension Father    Diabetes Father    Basal cell carcinoma Father    Cancer Maternal Grandmother 70       Breast   Breast cancer Maternal Grandmother 96   Cancer Paternal Grandmother 33       Colon   Lung cancer Paternal Grandfather    Melanoma Paternal Grandfather    Ovarian cancer Neg Hx     Social History   Socioeconomic History   Marital status: Married    Spouse name: Not on file   Number of children: Not on file   Years of education: Not on file   Highest education level: Not on file  Occupational History   Not on file  Tobacco Use   Smoking status: Never   Smokeless tobacco: Never  Vaping Use   Vaping Use: Never used  Substance and Sexual  Activity   Alcohol use: Yes    Comment: Occasionally, maybe once a month   Drug use: No   Sexual activity: Yes    Partners: Male    Birth control/protection: Surgical    Comment: Husband- Vasectomy  Other Topics Concern   Not on file  Social History Narrative   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, mental health    Lives with husband and 1 son (10 yo in August)    Pets: Cat    Coffee- 1 cup coffee, 1 occasional unsweet tea, no soda    Highest level education- Masters degree   Enjoys read, Careers adviser, and cross stitch    Social Determinants of Health   Financial Resource Strain: Not on file  Food Insecurity: Not on file  Transportation Needs: Not on file  Physical Activity: Not on file  Stress: Not on file  Social Connections: Not on file  Intimate Partner Violence: Not on file     Outpatient Medications Prior to Visit  Medication Sig Dispense Refill   Acetaminophen (TYLENOL PO) Take by mouth.     Cholecalciferol (VITAMIN D3) 50 MCG (2000 UT) capsule Take 2,000 Units by mouth daily.     escitalopram (LEXAPRO) 10 MG tablet TAKE 1 TABLET BY MOUTH EVERY DAY 90 tablet  2   fexofenadine (ALLEGRA) 60 MG tablet Take 60 mg by mouth daily.     hydrocortisone 2.5 % cream Use as directed. Instruction in patient handout 30 g 3   Ibuprofen (ADVIL PO) Take by mouth as needed.     meloxicam (MOBIC) 7.5 MG tablet TAKE 1 TABLET BY MOUTH EVERY DAY AS NEEDED FOR PAIN 90 tablet 0   metFORMIN (GLUCOPHAGE-XR) 500 MG 24 hr tablet TAKE 1 TABLET BY MOUTH EVERY EVENING. 90 tablet 2   Misc Natural Products (ELDERBERRY ZINC/VIT C/IMMUNE MT) Use as directed in the mouth or throat as needed.     Omega 3 1000 MG CAPS Take 2,000 mg by mouth daily.     pantoprazole (PROTONIX) 20 MG tablet TAKE 1 TABLET (20 MG TOTAL) BY MOUTH EVERY MORNING. TAKE 30 MINUTES TO HOUR BEFORE BREAKFAST 90 tablet 1   Probiotic Product (PROBIOTIC ADVANCED PO) Take by mouth.     rosuvastatin (CRESTOR) 5 MG tablet TAKE 1 TABLET BY MOUTH EVERY DAY IN  THE EVENING 90 tablet 3   No facility-administered medications prior to visit.    Allergies  Allergen Reactions   Biaxin [Clarithromycin] Diarrhea and Nausea And Vomiting    ROS Review of Systems  HENT:  Positive for congestion, ear pain, sinus pressure and sore throat.   Respiratory:  Positive for cough. Negative for shortness of breath.   Musculoskeletal: Negative.   Neurological:  Positive for headaches.  Psychiatric/Behavioral: Negative.        Objective:    Physical Exam Constitutional:      Appearance: Normal appearance.  HENT:     Head: Normocephalic.     Right Ear: A middle ear effusion is present.     Left Ear: A middle ear effusion is present.     Nose:     Right Sinus: Maxillary sinus tenderness and frontal sinus tenderness present.     Left Sinus: Maxillary sinus tenderness and frontal sinus tenderness present.     Mouth/Throat:     Mouth: Mucous membranes are moist.     Pharynx: Posterior oropharyngeal erythema present. No oropharyngeal exudate.  Cardiovascular:     Rate and Rhythm: Normal rate and regular rhythm.     Pulses: Normal pulses.     Heart sounds: Normal heart sounds. No murmur heard. Pulmonary:     Effort: Pulmonary effort is normal.     Breath sounds: Normal breath sounds. No stridor. No wheezing.  Musculoskeletal:        General: No swelling or signs of injury.  Skin:    General: Skin is warm.  Neurological:     General: No focal deficit present.     Mental Status: She is alert and oriented to person, place, and time. Mental status is at baseline.  Psychiatric:        Mood and Affect: Mood normal.        Behavior: Behavior normal.        Thought Content: Thought content normal.        Judgment: Judgment normal.     BP 128/84   Pulse 93   Temp 98.5 F (36.9 C) (Oral)   Ht 5\' 4"  (1.626 m)   Wt 232 lb 9.6 oz (105.5 kg)   LMP  (LMP Unknown)   SpO2 96%   BMI 39.93 kg/m  Wt Readings from Last 3 Encounters:  04/11/23 232 lb 9.6  oz (105.5 kg)  02/15/23 227 lb 1.9 oz (103 kg)  01/18/23 223 lb 12.8  oz (101.5 kg)     Health Maintenance  Topic Date Due   COVID-19 Vaccine (5 - 2023-24 season) 08/05/2022   OPHTHALMOLOGY EXAM  01/05/2023   INFLUENZA VACCINE  07/06/2023   HEMOGLOBIN A1C  07/19/2023   Diabetic kidney evaluation - eGFR measurement  01/19/2024   Diabetic kidney evaluation - Urine ACR  01/19/2024   FOOT EXAM  01/19/2024   PAP SMEAR-Modifier  02/27/2024   DTaP/Tdap/Td (2 - Td or Tdap) 09/30/2029   COLONOSCOPY (Pts 45-12yrs Insurance coverage will need to be confirmed)  04/05/2032   Hepatitis C Screening  Completed   HIV Screening  Completed   HPV VACCINES  Aged Out    There are no preventive care reminders to display for this patient.  Lab Results  Component Value Date   TSH 2.10 02/04/2022   Lab Results  Component Value Date   WBC 9.5 02/04/2022   HGB 13.5 02/04/2022   HCT 42.5 02/04/2022   MCV 79.4 02/04/2022   PLT 364.0 02/04/2022   Lab Results  Component Value Date   NA 137 01/18/2023   K 3.8 01/18/2023   CO2 24 01/18/2023   GLUCOSE 107 (H) 01/18/2023   BUN 13 01/18/2023   CREATININE 0.73 01/18/2023   BILITOT 0.3 01/18/2023   ALKPHOS 86 01/18/2023   AST 15 01/18/2023   ALT 13 01/18/2023   PROT 7.5 01/18/2023   ALBUMIN 4.2 01/18/2023   CALCIUM 9.5 01/18/2023   GFR 98.80 01/18/2023   Lab Results  Component Value Date   CHOL 130 01/18/2023   Lab Results  Component Value Date   HDL 56.50 01/18/2023   Lab Results  Component Value Date   LDLCALC 49 01/18/2023   Lab Results  Component Value Date   TRIG 122.0 01/18/2023   Lab Results  Component Value Date   CHOLHDL 2 01/18/2023   Lab Results  Component Value Date   HGBA1C 6.1 (A) 01/18/2023      Assessment & Plan:  Acute non-recurrent pansinusitis Assessment & Plan: Started on Augmentin and prednisone. Also sent Diflucan due to h/o yeast infection while on antibiotic, patient requested. Advised to increase  fluid intake and use humidifier or steam. If symptoms not improving call the office back for further evaluation.    Other orders -     Amoxicillin-Pot Clavulanate; Take 1 tablet by mouth 2 (two) times daily.  Dispense: 20 tablet; Refill: 0 -     predniSONE; Take 1 tablet (20 mg total) by mouth daily with breakfast.  Dispense: 5 tablet; Refill: 0 -     Fluconazole; Take 1 tablet (150 mg total) by mouth daily.  Dispense: 1 tablet; Refill: 0    Follow-up: Return if symptoms worsen or fail to improve.   Kara Dies, NP

## 2023-04-20 DIAGNOSIS — G4733 Obstructive sleep apnea (adult) (pediatric): Secondary | ICD-10-CM | POA: Diagnosis not present

## 2023-04-24 DIAGNOSIS — G4733 Obstructive sleep apnea (adult) (pediatric): Secondary | ICD-10-CM | POA: Diagnosis not present

## 2023-04-25 DIAGNOSIS — G4733 Obstructive sleep apnea (adult) (pediatric): Secondary | ICD-10-CM | POA: Diagnosis not present

## 2023-05-21 DIAGNOSIS — G4733 Obstructive sleep apnea (adult) (pediatric): Secondary | ICD-10-CM | POA: Diagnosis not present

## 2023-05-25 DIAGNOSIS — G4733 Obstructive sleep apnea (adult) (pediatric): Secondary | ICD-10-CM | POA: Diagnosis not present

## 2023-05-26 ENCOUNTER — Other Ambulatory Visit: Payer: Self-pay | Admitting: Family

## 2023-05-26 DIAGNOSIS — E119 Type 2 diabetes mellitus without complications: Secondary | ICD-10-CM

## 2023-05-30 ENCOUNTER — Encounter: Payer: BC Managed Care – PPO | Admitting: Dermatology

## 2023-06-20 DIAGNOSIS — G4733 Obstructive sleep apnea (adult) (pediatric): Secondary | ICD-10-CM | POA: Diagnosis not present

## 2023-06-22 ENCOUNTER — Other Ambulatory Visit: Payer: Self-pay | Admitting: Family

## 2023-06-22 DIAGNOSIS — M79671 Pain in right foot: Secondary | ICD-10-CM

## 2023-06-24 DIAGNOSIS — G4733 Obstructive sleep apnea (adult) (pediatric): Secondary | ICD-10-CM | POA: Diagnosis not present

## 2023-07-05 ENCOUNTER — Other Ambulatory Visit: Payer: Self-pay | Admitting: Family

## 2023-07-20 ENCOUNTER — Encounter (INDEPENDENT_AMBULATORY_CARE_PROVIDER_SITE_OTHER): Payer: Self-pay

## 2023-07-21 ENCOUNTER — Other Ambulatory Visit: Payer: Self-pay | Admitting: Oncology

## 2023-07-21 DIAGNOSIS — Z006 Encounter for examination for normal comparison and control in clinical research program: Secondary | ICD-10-CM

## 2023-07-21 DIAGNOSIS — G4733 Obstructive sleep apnea (adult) (pediatric): Secondary | ICD-10-CM | POA: Diagnosis not present

## 2023-07-24 DIAGNOSIS — G4733 Obstructive sleep apnea (adult) (pediatric): Secondary | ICD-10-CM | POA: Diagnosis not present

## 2023-07-26 DIAGNOSIS — G4733 Obstructive sleep apnea (adult) (pediatric): Secondary | ICD-10-CM | POA: Diagnosis not present

## 2023-08-03 ENCOUNTER — Ambulatory Visit
Admission: RE | Admit: 2023-08-03 | Discharge: 2023-08-03 | Disposition: A | Discharge status: Home / Self Care | Payer: BC Managed Care – PPO | Source: Ambulatory Visit | Attending: Family | Admitting: Family

## 2023-08-03 ENCOUNTER — Other Ambulatory Visit
Admission: RE | Admit: 2023-08-03 | Discharge: 2023-08-03 | Disposition: A | Discharge status: Home / Self Care | Payer: BC Managed Care – PPO | Source: Ambulatory Visit | Attending: Family | Admitting: Family

## 2023-08-03 DIAGNOSIS — Z1231 Encounter for screening mammogram for malignant neoplasm of breast: Secondary | ICD-10-CM | POA: Insufficient documentation

## 2023-08-03 DIAGNOSIS — Z006 Encounter for examination for normal comparison and control in clinical research program: Secondary | ICD-10-CM

## 2023-08-15 LAB — GENECONNECT MOLECULAR SCREEN: Genetic Analysis Overall Interpretation: NEGATIVE

## 2023-08-18 ENCOUNTER — Ambulatory Visit: Payer: BC Managed Care – PPO | Admitting: Family

## 2023-08-18 ENCOUNTER — Encounter: Payer: Self-pay | Admitting: Family

## 2023-08-18 VITALS — BP 130/76 | HR 84 | Temp 98.8°F | Ht 64.0 in | Wt 238.4 lb

## 2023-08-18 DIAGNOSIS — E785 Hyperlipidemia, unspecified: Secondary | ICD-10-CM

## 2023-08-18 DIAGNOSIS — E119 Type 2 diabetes mellitus without complications: Secondary | ICD-10-CM

## 2023-08-18 DIAGNOSIS — F411 Generalized anxiety disorder: Secondary | ICD-10-CM | POA: Diagnosis not present

## 2023-08-18 DIAGNOSIS — Z7984 Long term (current) use of oral hypoglycemic drugs: Secondary | ICD-10-CM

## 2023-08-18 DIAGNOSIS — K2100 Gastro-esophageal reflux disease with esophagitis, without bleeding: Secondary | ICD-10-CM

## 2023-08-18 LAB — TSH: TSH: 2.5 u[IU]/mL (ref 0.35–5.50)

## 2023-08-18 LAB — HEMOGLOBIN A1C: Hgb A1c MFr Bld: 6.6 % — ABNORMAL HIGH (ref 4.6–6.5)

## 2023-08-18 MED ORDER — METFORMIN HCL ER 500 MG PO TB24: 2000.0000 mg | ORAL_TABLET | Freq: Every evening | ORAL | 5 refills | Status: DC

## 2023-08-18 MED ORDER — PANTOPRAZOLE SODIUM 20 MG PO TBEC
20.0000 mg | DELAYED_RELEASE_TABLET | ORAL | 3 refills | Status: DC
Start: 2023-08-18 — End: 2023-10-05

## 2023-08-18 NOTE — Patient Instructions (Signed)
Metformin is used in prediabetes, diabetes, and also for weight loss by decreasing calorie consumption.   It works in a couple of ways by decreasing liver glucose production, decreases intestinal absorption of glucose and improves insulin sensitivity (increases peripheral glucose uptake and utilization).   Increase metformin XR with 1000mg  tablet at night. After one week, you Lavery take take two tablets at night and one tablet in the morning.  The next week,  you Vines take two tablets in the morning ( 1000mg  total) and two tablets at night (1000mg  total). This will bring you to a maximum daily dose of 2000mg /day which is maximum dose.  So you are aware,  you Williamsen take ALL 4 tablets of metformin together at the same time if preferable and doesn't cause GI upset. You Colee take metformin 2000mg  ( four of the 500mg  tablets) together in the morning or at night if you prefer.   Along the way, if you want to increase more slowly, please do as this medication can cause GI discomfort and loose stools which usually get better with time , however some patients find that they can only tolerate a certain dose and cannot increase to maximum dose.

## 2023-08-18 NOTE — Progress Notes (Unsigned)
Assessment & Plan:  There are no diagnoses linked to this encounter.   Return precautions given.   Risks, benefits, and alternatives of the medications and treatment plan prescribed today were discussed, and patient expressed understanding.   Education regarding symptom management and diagnosis given to patient on AVS either electronically or printed.  No follow-ups on file.  Rennie Plowman, FNP  Subjective:    Patient ID: Jaime Tate, female    DOB: 08/16/1976, 47 y.o.   MRN: [id number]  CC: Jaime Tate is a 47 y.o. female who presents today for follow up.   HPI: Concerned with   Fruit and small portion granola with [employer] Milk .  Coffee , zero sugar creamer [place] and cheese sandwich, or egg salad Hummus w/ cucumber   Grapes or watermelon or strawberries for a snack with small Kind Bar.   Dinner Corrow be sphagetti with [place] meat, tacos  Little treat after dinner sqaure of dark chocolate, 3 oreos.  Walking 3 days per week.        Allergies: Biaxin [clarithromycin] Current Outpatient Medications on File Prior to Visit  Medication Sig Dispense Refill   Acetaminophen (TYLENOL PO) Take by mouth.     Cholecalciferol (VITAMIN D3) 50 MCG (2000 UT) capsule Take 2,000 Units by mouth daily.     escitalopram (LEXAPRO) 10 MG tablet TAKE 1 TABLET BY MOUTH EVERY DAY 90 tablet 2   fexofenadine (ALLEGRA) 60 MG tablet Take 60 mg by mouth daily.     hydrocortisone 2.5 % cream Use as directed. Instruction in patient handout 30 g 3   Ibuprofen (ADVIL PO) Take by mouth as needed.     meloxicam (MOBIC) 7.5 MG tablet TAKE 1 TABLET BY MOUTH EVERY DAY AS NEEDED FOR PAIN 90 tablet 0   metFORMIN (GLUCOPHAGE-XR) 500 MG 24 hr tablet TAKE 1 TABLET BY MOUTH EVERY DAY IN THE EVENING 90 tablet 2   Misc Natural Products (ELDERBERRY ZINC/VIT C/IMMUNE MT) Use as directed in the mouth or throat as needed.     Omega 3 1000 MG CAPS Take 2,000 mg by mouth daily.     pantoprazole  (PROTONIX) 20 MG tablet TAKE 1 TABLET (20 MG TOTAL) BY MOUTH EVERY MORNING. TAKE 30 MINUTES TO HOUR BEFORE BREAKFAST 90 tablet 1   Probiotic Product (PROBIOTIC ADVANCED PO) Take by mouth.     rosuvastatin (CRESTOR) 5 MG tablet TAKE 1 TABLET BY MOUTH EVERY DAY IN THE EVENING 90 tablet 3   No current facility-administered medications on file prior to visit.    Review of Systems    Objective:    BP 130/76   Pulse 84   Temp 98.8 F (37.1 C) (Oral)   Ht 5\' 4"  (1.626 m)   Wt 238 lb 6.4 oz (108.1 kg)   SpO2 98%   BMI 40.92 kg/m  BP Readings from Last 3 Encounters:  08/18/23 130/76  04/11/23 128/84  02/15/23 128/86   Wt Readings from Last 3 Encounters:  08/18/23 238 lb 6.4 oz (108.1 kg)  04/11/23 232 lb 9.6 oz (105.5 kg)  02/15/23 227 lb 1.9 oz (103 kg)    Physical Exam

## 2023-08-19 LAB — INSULIN, RANDOM: Insulin: 32.7 u[IU]/mL — ABNORMAL HIGH

## 2023-08-21 NOTE — Assessment & Plan Note (Addendum)
Chronic, stable. Continue lexapro 10mg  qd

## 2023-08-21 NOTE — Assessment & Plan Note (Signed)
Lab Results  Component Value Date   LDLCALC 49 01/18/2023   Continue crestor 5mg  every day.

## 2023-08-21 NOTE — Assessment & Plan Note (Signed)
Lab Results  Component Value Date   HGBA1C 6.6 (H) 08/18/2023   A1c has increased.  Start metformin 500 mg daily and titrate for additional help with weight loss.  Counseled on side effects.

## 2023-08-24 DIAGNOSIS — G4733 Obstructive sleep apnea (adult) (pediatric): Secondary | ICD-10-CM | POA: Diagnosis not present

## 2023-09-23 DIAGNOSIS — G4733 Obstructive sleep apnea (adult) (pediatric): Secondary | ICD-10-CM | POA: Diagnosis not present

## 2023-09-27 ENCOUNTER — Other Ambulatory Visit: Payer: Self-pay | Admitting: Family

## 2023-09-27 DIAGNOSIS — M79671 Pain in right foot: Secondary | ICD-10-CM

## 2023-10-04 ENCOUNTER — Other Ambulatory Visit: Payer: Self-pay | Admitting: Family

## 2023-10-04 DIAGNOSIS — K2100 Gastro-esophageal reflux disease with esophagitis, without bleeding: Secondary | ICD-10-CM

## 2023-10-16 IMAGING — US US CAROTID DUPLEX BILAT
1 series · 13 of 24 positions shown · non-contrast
Comparison: None.

CLINICAL DATA: Pulsatile tinnitus of left ear

EXAM:
BILATERAL CAROTID DUPLEX ULTRASOUND
TECHNIQUE: Gray scale imaging, color Doppler and duplex ultrasound were
performed of bilateral carotid and vertebral arteries in the neck.

[Series 1: us carotid duplex bilat · 0.05mm/px · 13 of 56 slices shown]
[im 1/56]
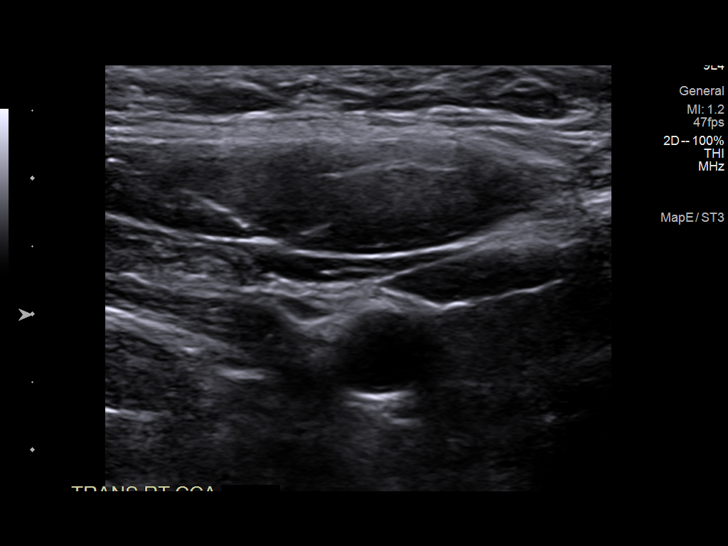
[im 5/56]
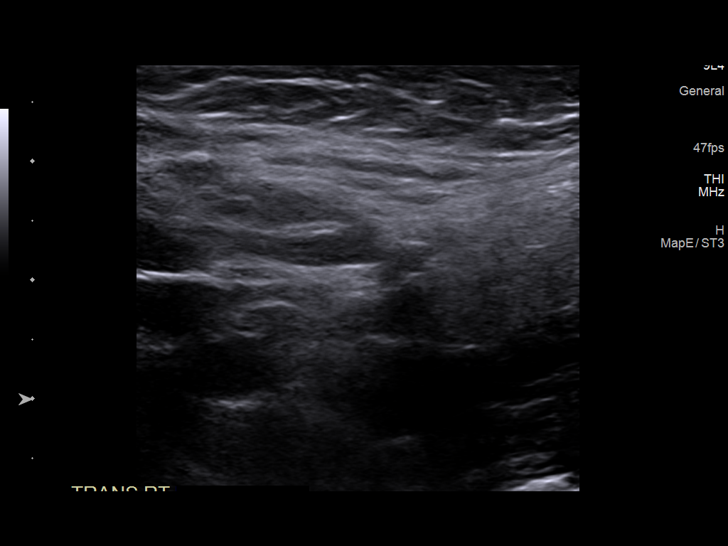
[im 10/56]
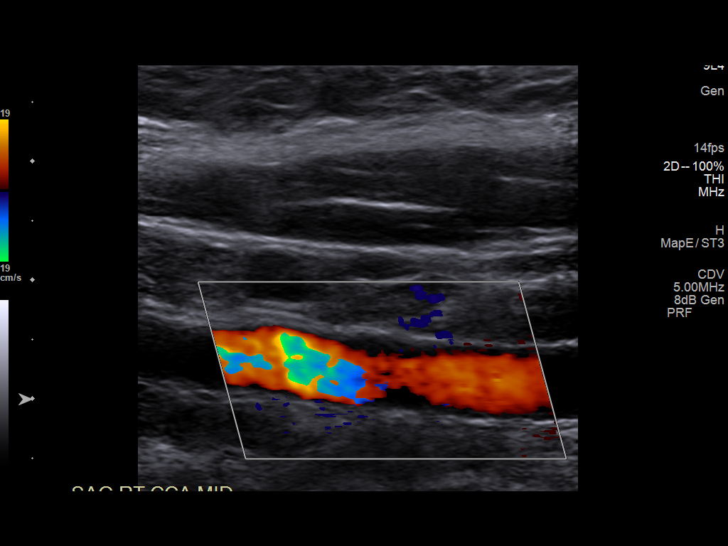
[im 15/56]
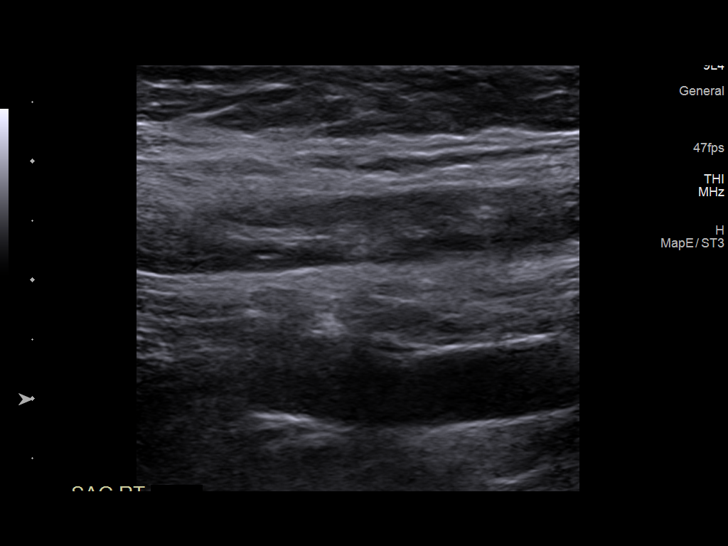
[im 20/56]
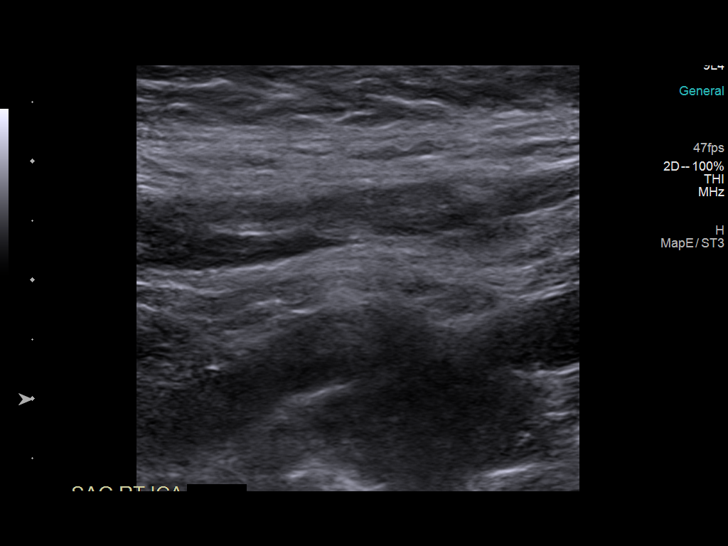
[im 24/56]
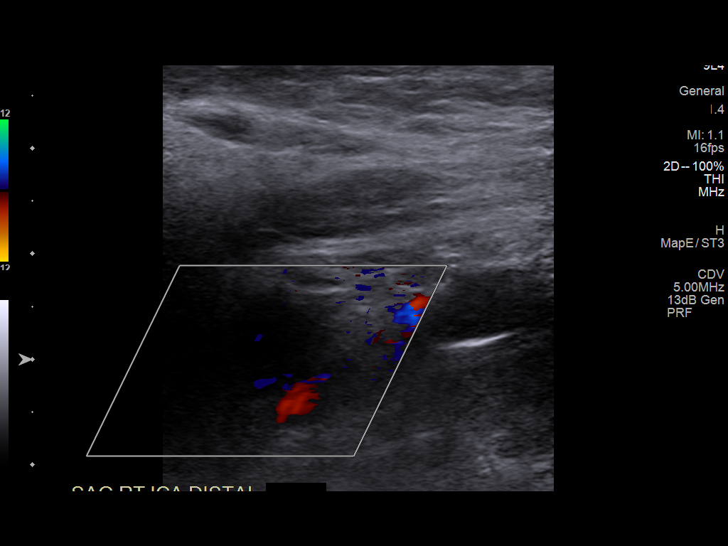
[im 29/56]
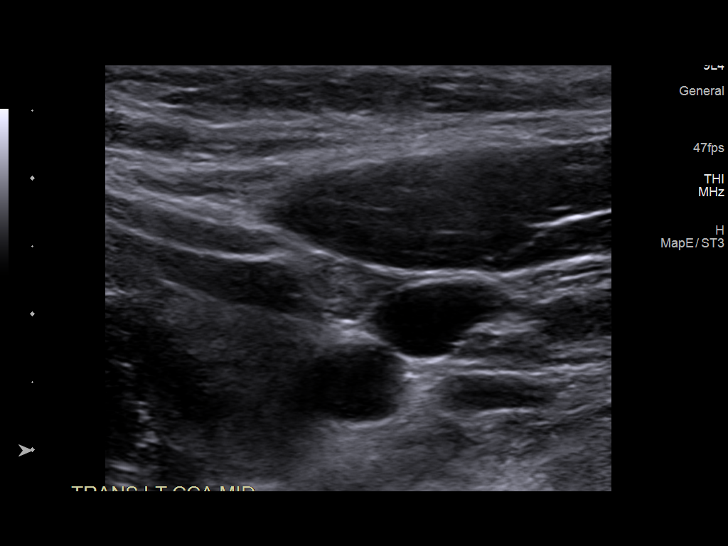
[im 32/56]
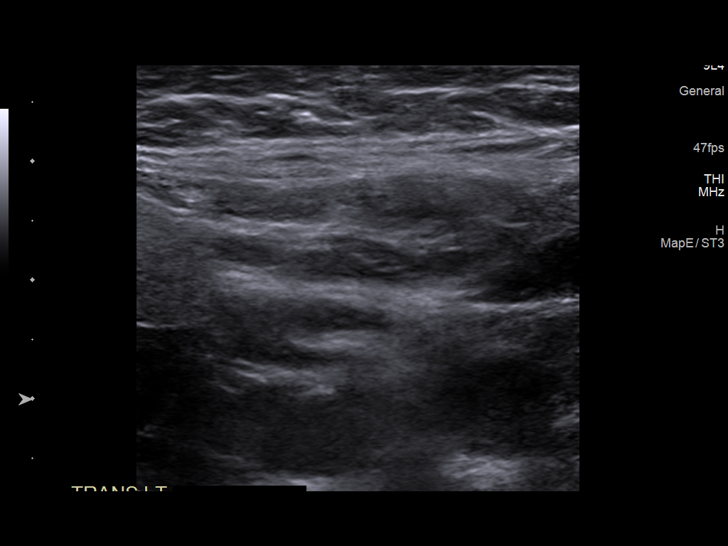
[im 36/56]
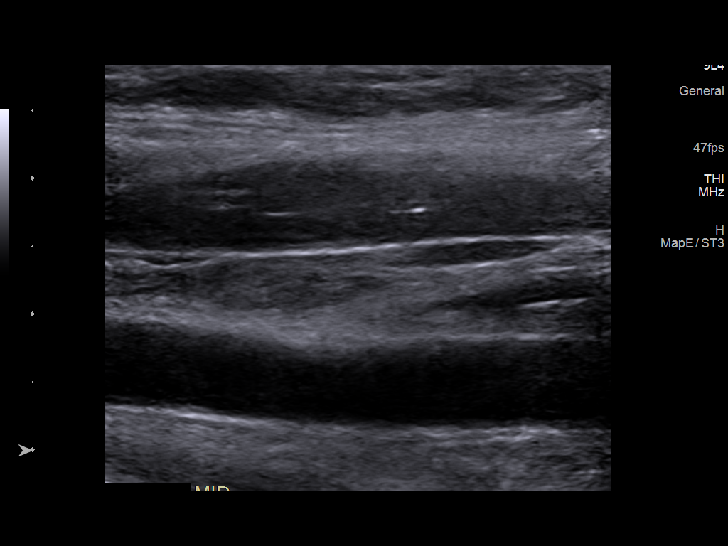
[im 41/56]
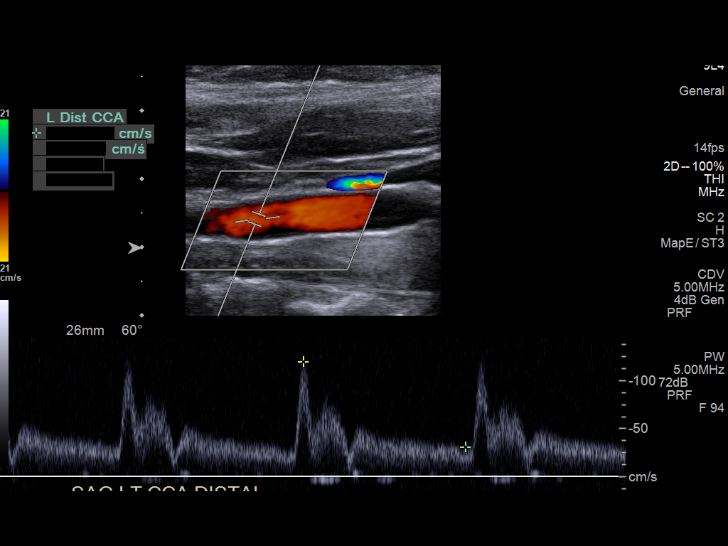
[im 46/56]
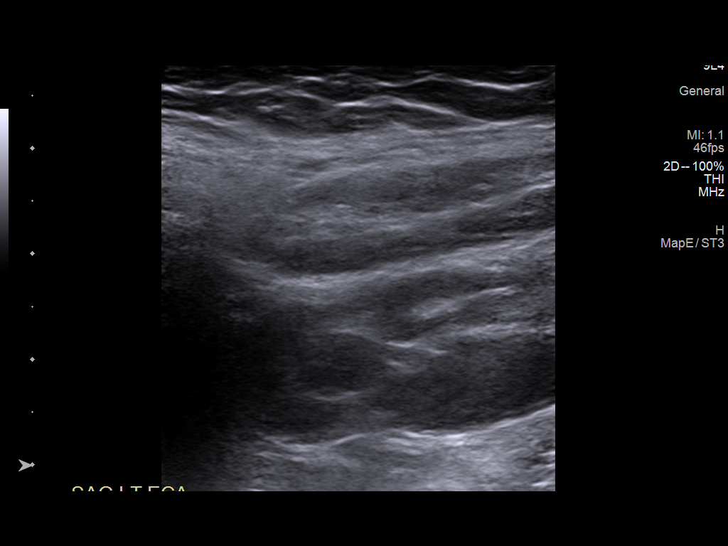
[im 51/56]
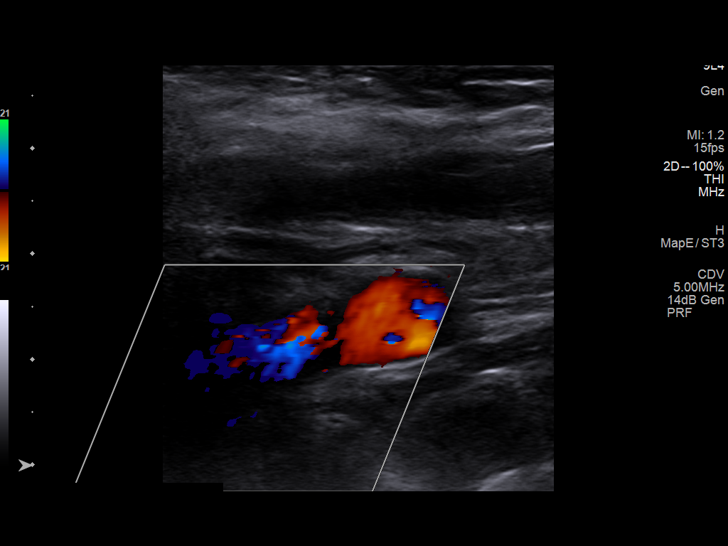
[im 56/56]
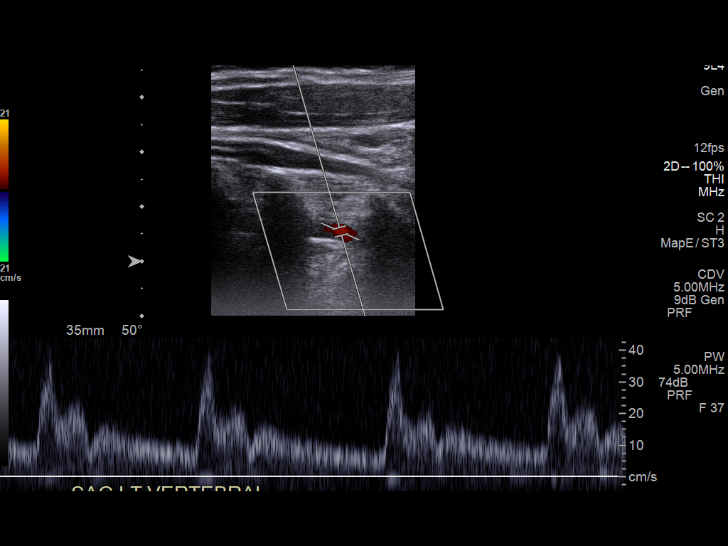

[13 of 24 positions shown; findings below may reference images not displayed]

FINDINGS: Criteria: Quantification of carotid stenosis is based on velocity
parameters that correlate the residual internal carotid diameter
with NASCET-based stenosis levels, using the diameter of the distal
internal carotid lumen as the denominator for stenosis measurement.

The following velocity measurements were obtained:

RIGHT

ICA: 53/22 cm/sec

CCA: 108/27 cm/sec

SYSTOLIC ICA/CCA RATIO:

ECA: 59 cm/sec

LEFT

ICA: 59/58 cm/sec

CCA: 120/31 cm/sec

SYSTOLIC ICA/CCA RATIO:

ECA: 66 cm/sec

RIGHT CAROTID ARTERY: No significant atherosclerotic plaque or
stenosis. Normal low resistance waveforms in the internal carotid
artery.

RIGHT VERTEBRAL ARTERY:  Antegrade flow

LEFT CAROTID ARTERY: No significant atherosclerotic plaque or
stenosis. Normal low resistance waveforms in the internal carotid
artery.

LEFT VERTEBRAL ARTERY:  Antegrade flow
IMPRESSION: Normal ultrasound and Doppler exam of the carotid arteries. No
significant atherosclerotic plaque or stenosis. Bilateral vertebral
arteries demonstrate normal antegrade flow.

## 2023-10-19 ENCOUNTER — Encounter: Payer: Self-pay | Admitting: Adult Health

## 2023-10-19 ENCOUNTER — Ambulatory Visit: Payer: BC Managed Care – PPO | Admitting: Adult Health

## 2023-10-19 VITALS — BP 110/80 | HR 93 | Temp 97.6°F | Ht 64.0 in | Wt 233.0 lb

## 2023-10-19 DIAGNOSIS — G4733 Obstructive sleep apnea (adult) (pediatric): Secondary | ICD-10-CM

## 2023-10-19 NOTE — Progress Notes (Signed)
@Patient  ID: Jaime Tate, female    DOB: Rudie 20, 1977, 47 y.o.   MRN: [id number]  Chief Complaint  Patient presents with   Follow-up    Referring provider: Allegra Grana, FNP  HPI: 47 yo female followed for moderate OSA  TEST/EVENTS :  August 16, 2022 that showed moderate sleep apnea with AHI at 16.4/hour and SPO2 low at 74%   10/19/2023 Follow up : OSA  Patient presents for a follow-up for sleep apnea.  Last seen January 2024.  Patient has moderate obstructive sleep apnea.  Is on nocturnal CPAP.  Patient says she is doing well on CPAP.  Says that she feels she benefits from CPAP with decreased daytime sleepiness.  CPAP download shows excellent compliance with 100% usage.  Daily average usage at 7.5 hours.  Patient is on auto CPAP 5 to 13 cm H2O.  AHI 7.1/hour.  Daily average pressure of 12.5 cm H2O.  She changed to the Dreamwear full face mask, really likes it better.   Allergies  Allergen Reactions   Biaxin [Clarithromycin] Diarrhea and Nausea And Vomiting    Immunization History  Administered Date(s) Administered   Influenza,inj,Quad PF,6+ Mos 01/04/2016, 09/12/2016, 10/10/2018, 09/05/2019, 09/27/2020, 08/28/2021   Influenza-Unspecified 09/09/2017, 09/15/2018, 08/29/2023   PFIZER(Purple Top)SARS-COV-2 Vaccination 02/19/2020, 03/11/2020, 10/09/2020   Pfizer Covid-19 Vaccine Bivalent Booster 13yrs & up 08/28/2021   Pfizer(Comirnaty)Fall Seasonal Vaccine 12 years and older 08/29/2023   Tdap 10/01/2019    Past Medical History:  Diagnosis Date   Allergy    Seasonal   Anxiety    Chicken pox    GERD (gastroesophageal reflux disease)    Vaginal delivery     Tobacco History: Social History   Tobacco Use  Smoking Status Never  Smokeless Tobacco Never   Counseling given: Not Answered   Outpatient Medications Prior to Visit  Medication Sig Dispense Refill   Acetaminophen (TYLENOL PO) Take by mouth.     Cholecalciferol (VITAMIN D3) 50 MCG (2000 UT) capsule  Take 2,000 Units by mouth daily.     escitalopram (LEXAPRO) 10 MG tablet TAKE 1 TABLET BY MOUTH EVERY DAY 90 tablet 2   fexofenadine (ALLEGRA) 60 MG tablet Take 60 mg by mouth daily.     hydrocortisone 2.5 % cream Use as directed. Instruction in patient handout 30 g 3   Ibuprofen (ADVIL PO) Take by mouth as needed.     meloxicam (MOBIC) 7.5 MG tablet TAKE 1 TABLET BY MOUTH EVERY DAY AS NEEDED FOR PAIN 90 tablet 0   metFORMIN (GLUCOPHAGE-XR) 500 MG 24 hr tablet Take 4 tablets (2,000 mg total) by mouth every evening. 120 tablet 5   Misc Natural Products (ELDERBERRY ZINC/VIT C/IMMUNE MT) Use as directed in the mouth or throat as needed.     Omega 3 1000 MG CAPS Take 2,000 mg by mouth daily.     pantoprazole (PROTONIX) 20 MG tablet TAKE 1 TABLET (20 MG TOTAL) BY MOUTH EVERY MORNING. TAKE 30 MINUTES TO HOUR BEFORE BREAKFAST 90 tablet 3   Probiotic Product (PROBIOTIC ADVANCED PO) Take by mouth.     rosuvastatin (CRESTOR) 5 MG tablet TAKE 1 TABLET BY MOUTH EVERY DAY IN THE EVENING 90 tablet 3   No facility-administered medications prior to visit.     Review of Systems:   Constitutional:   No  weight loss, night sweats,  Fevers, chills, fatigue, or  lassitude.  HEENT:   No headaches,  Difficulty swallowing,  Tooth/dental problems, or  Sore throat,  No sneezing, itching, ear ache, nasal congestion, post nasal drip,   CV:  No chest pain,  Orthopnea, PND, swelling in lower extremities, anasarca, dizziness, palpitations, syncope.   GI  No heartburn, indigestion, abdominal pain, nausea, vomiting, diarrhea, change in bowel habits, loss of appetite, bloody stools.   Resp: No shortness of breath with exertion or at rest.  No excess mucus, no productive cough,  No non-productive cough,  No coughing up of blood.  No change in color of mucus.  No wheezing.  No chest wall deformity  Skin: no rash or lesions.  GU: no dysuria, change in color of urine, no urgency or frequency.  No flank pain,  no hematuria   MS:  No joint pain or swelling.  No decreased range of motion.  No back pain.    Physical Exam  BP 110/80 (BP Location: Left Arm, Patient Position: Sitting, Cuff Size: Normal)   Pulse 93   Temp 97.6 F (36.4 C) (Temporal)   Ht 5\' 4"  (1.626 m)   Wt 233 lb (105.7 kg)   SpO2 96%   BMI 39.99 kg/m   GEN: A/Ox3; pleasant , NAD, well nourished    HEENT:  Kimberly/AT,  NOSE-clear, THROAT-clear, no lesions, no postnasal drip or exudate noted.   NECK:  Supple w/ fair ROM; no JVD; normal carotid impulses w/o bruits; no thyromegaly or nodules palpated; no lymphadenopathy.    RESP  Clear  P & A; w/o, wheezes/ rales/ or rhonchi. no accessory muscle use, no dullness to percussion  CARD:  RRR, no m/r/g, no peripheral edema, pulses intact, no cyanosis or clubbing.  GI:   Soft & nt; nml bowel sounds; no organomegaly or masses detected.   Musco: Warm bil, no deformities or joint swelling noted.   Neuro: alert, no focal deficits noted.    Skin: Warm, no lesions or rashes    Lab Results:  CBC  BNP No results found for: "BNP"  ProBNP No results found for: "PROBNP"  Imaging: No results found.  Administration History     None           No data to display          No results found for: "NITRICOXIDE"      Assessment & Plan:   OSA (obstructive sleep apnea) Moderate obstructive sleep apnea.  Patient has perceived clinical benefit.  Download shows excellent compliance.  Does have a few residual events.  Patient does not feel like she can go any higher on her pressure.  Will adjust auto CPAP to 7 to 13 cm H2O.  To see if we can decrease the number of sleep apneic events.  Predominant events were obstructive.  Also discussed mask fitting as she did have some mask leaks.  Plan  Patient Instructions  Continue on CPAP therapy at bedtime.  Wear all night long.  Goal is to wear for more than 6 hours each night Change CPAP pressure to Auto CPAP 7 to 13cmH2O.  Work  on healthy weight loss  Healthy sleep regimen. Do not drive if sleepy Follow-up in 1 year and as needed      Rubye Oaks, NP 10/19/2023

## 2023-10-19 NOTE — Patient Instructions (Addendum)
Continue on CPAP therapy at bedtime.  Wear all night long.  Goal is to wear for more than 6 hours each night Change CPAP pressure to Auto CPAP 7 to 13cmH2O.  Work on healthy weight loss  Healthy sleep regimen. Do not drive if sleepy Follow-up in 1 year and as needed

## 2023-10-19 NOTE — Assessment & Plan Note (Signed)
Moderate obstructive sleep apnea.  Patient has perceived clinical benefit.  Download shows excellent compliance.  Does have a few residual events.  Patient does not feel like she can go any higher on her pressure.  Will adjust auto CPAP to 7 to 13 cm H2O.  To see if we can decrease the number of sleep apneic events.  Predominant events were obstructive.  Also discussed mask fitting as she did have some mask leaks.  Plan  Patient Instructions  Continue on CPAP therapy at bedtime.  Wear all night long.  Goal is to wear for more than 6 hours each night Change CPAP pressure to Auto CPAP 7 to 13cmH2O.  Work on healthy weight loss  Healthy sleep regimen. Do not drive if sleepy Follow-up in 1 year and as needed

## 2023-10-23 DIAGNOSIS — G4733 Obstructive sleep apnea (adult) (pediatric): Secondary | ICD-10-CM | POA: Diagnosis not present

## 2023-10-26 DIAGNOSIS — G4733 Obstructive sleep apnea (adult) (pediatric): Secondary | ICD-10-CM | POA: Diagnosis not present

## 2023-11-17 ENCOUNTER — Telehealth: Payer: Self-pay

## 2023-11-17 DIAGNOSIS — E119 Type 2 diabetes mellitus without complications: Secondary | ICD-10-CM

## 2023-11-17 NOTE — Telephone Encounter (Signed)
Patient states she has a question regarding her metformin.  Patient states the prescription is different from the dosage they discussed for her during her last visit.  Patient states she would like to know if she needs to change the way she is taking it.

## 2023-11-18 ENCOUNTER — Other Ambulatory Visit: Payer: Self-pay | Admitting: Family

## 2023-11-18 DIAGNOSIS — E785 Hyperlipidemia, unspecified: Secondary | ICD-10-CM

## 2023-11-20 NOTE — Telephone Encounter (Signed)
Called pt to go over message below. Per Margaret,Pt was about to get on a work call and stated that she would look at message at her conveinience  and if she has any questions she will each back out    I advised that she can start with metformin 500 mg 1 tablet daily and titrate with the below instructions for weight loss.   The prescription metformin 2000mg  every day reflects if she titrated to maximum dose.  She does not start with maximum dose however advised that she titrate per below to get there     Start metformin XR with one 500mg  tablet at night.  After one week, you Laurent increase to two tablets at night ( total of 1000mg ) . The third week, you Alia take take two tablets at night and one tablet in the morning.  The fourth week, you Haji take two tablets in the morning ( 1000mg  total) and two tablets at night (1000mg  total). This will bring you to a maximum daily dose of 2000mg /day which is maximum dose.  So you are aware,  you Bognar take ALL 4 tablets of metformin together at the same time if preferable and doesn't cause GI upset. You Sieling take metformin 2000mg  ( four of the 500mg  tablets) together in the morning or at night if you prefer.    Along the way, if you want to increase more slowly, please do as this medication can cause GI discomfort and loose stools which usually get better with time , however some patients find that they can only tolerate a certain dose and cannot increase to maximum dose.

## 2023-11-20 NOTE — Telephone Encounter (Signed)
Call pt  I advised that she can start with metformin 500 mg 1 tablet daily and titrate with the below instructions for weight loss.  The prescription metformin 2000mg  every day reflects if she titrated to maximum dose.  She does not start with maximum dose however advised that she titrate per below to get there   Start metformin XR with one 500mg  tablet at night.  After one week, you Gradel increase to two tablets at night ( total of 1000mg ) . The third week, you Kazanjian take take two tablets at night and one tablet in the morning.  The fourth week, you Panther take two tablets in the morning ( 1000mg  total) and two tablets at night (1000mg  total). This will bring you to a maximum daily dose of 2000mg /day which is maximum dose.  So you are aware,  you Guild take ALL 4 tablets of metformin together at the same time if preferable and doesn't cause GI upset. You Pfeifle take metformin 2000mg  ( four of the 500mg  tablets) together in the morning or at night if you prefer.   Along the way, if you want to increase more slowly, please do as this medication can cause GI discomfort and loose stools which usually get better with time , however some patients find that they can only tolerate a certain dose and cannot increase to maximum dose.

## 2023-11-21 ENCOUNTER — Encounter: Payer: Self-pay | Admitting: Family

## 2023-11-21 MED ORDER — METFORMIN HCL ER 500 MG PO TB24: 2000.0000 mg | ORAL_TABLET | Freq: Every evening | ORAL | 5 refills | Status: DC

## 2023-11-21 NOTE — Addendum Note (Signed)
Addended by: [place], Noemy Hallmon on: 11/21/2023 02:42 PM   Modules accepted: Orders

## 2023-11-21 NOTE — Telephone Encounter (Signed)
 Care team updated and letter sent for eye exam notes.

## 2023-11-22 DIAGNOSIS — G4733 Obstructive sleep apnea (adult) (pediatric): Secondary | ICD-10-CM | POA: Diagnosis not present

## 2023-12-20 ENCOUNTER — Ambulatory Visit: Payer: BC Managed Care – PPO | Admitting: Family

## 2023-12-20 ENCOUNTER — Other Ambulatory Visit: Payer: Self-pay | Admitting: Family

## 2023-12-20 ENCOUNTER — Encounter: Payer: Self-pay | Admitting: Family

## 2023-12-20 VITALS — BP 130/72 | HR 71 | Temp 98.2°F | Ht 64.0 in | Wt 231.8 lb

## 2023-12-20 DIAGNOSIS — R5383 Other fatigue: Secondary | ICD-10-CM

## 2023-12-20 DIAGNOSIS — E785 Hyperlipidemia, unspecified: Secondary | ICD-10-CM

## 2023-12-20 DIAGNOSIS — E119 Type 2 diabetes mellitus without complications: Secondary | ICD-10-CM

## 2023-12-20 DIAGNOSIS — Z7985 Long-term (current) use of injectable non-insulin antidiabetic drugs: Secondary | ICD-10-CM

## 2023-12-20 DIAGNOSIS — G4733 Obstructive sleep apnea (adult) (pediatric): Secondary | ICD-10-CM | POA: Diagnosis not present

## 2023-12-20 DIAGNOSIS — E538 Deficiency of other specified B group vitamins: Secondary | ICD-10-CM

## 2023-12-20 DIAGNOSIS — F411 Generalized anxiety disorder: Secondary | ICD-10-CM

## 2023-12-20 LAB — LIPID PANEL
Cholesterol: 121 mg/dL (ref 0–200)
HDL: 55 mg/dL (ref >39.00)
LDL Cholesterol: 48 mg/dL (ref 0–99)
NonHDL: 65.85
Total CHOL/HDL Ratio: 2
Triglycerides: 89 mg/dL (ref 0.0–149.0)
VLDL: 17.8 mg/dL (ref 0.0–40.0)

## 2023-12-20 LAB — COMPREHENSIVE METABOLIC PANEL
ALT: 15 U/L (ref 0–35)
AST: 16 U/L (ref 0–37)
Albumin: 4.6 g/dL (ref 3.5–5.2)
Alkaline Phosphatase: 91 U/L (ref 39–117)
BUN: 8 mg/dL (ref 6–23)
CO2: 28 meq/L (ref 19–32)
Calcium: 9.5 mg/dL (ref 8.4–10.5)
Chloride: 103 meq/L (ref 96–112)
Creatinine, Ser: 0.72 mg/dL (ref 0.40–1.20)
GFR: 99.8 mL/min (ref >60.00)
Glucose, Bld: 97 mg/dL (ref 70–99)
Potassium: 4.1 meq/L (ref 3.5–5.1)
Sodium: 138 meq/L (ref 135–145)
Total Bilirubin: 0.4 mg/dL (ref 0.2–1.2)
Total Protein: 7.5 g/dL (ref 6.0–8.3)

## 2023-12-20 LAB — POCT GLYCOSYLATED HEMOGLOBIN (HGB A1C): Hemoglobin A1C: 6.3 % — AB (ref 4.0–5.6)

## 2023-12-20 LAB — VITAMIN B12: Vitamin B-12: 252 pg/mL (ref 211–911)

## 2023-12-20 LAB — VITAMIN D 25 HYDROXY (VIT D DEFICIENCY, FRACTURES): VITD: 32.48 ng/mL (ref 30.00–100.00)

## 2023-12-20 MED ORDER — SEMAGLUTIDE(0.25 OR 0.5MG/DOS) 2 MG/3ML ~~LOC~~ SOPN: 0.2500 mg | PEN_INJECTOR | SUBCUTANEOUS | 1 refills | Status: DC

## 2023-12-20 NOTE — Assessment & Plan Note (Signed)
 Question if suboptimal control in the setting of chronic fatigue, nonrestorative sleep.  She is  unsure if recent settings were updated after pulmonology follow-up in November.  Patient will reach out to pulmonology and medical equipment company.

## 2023-12-20 NOTE — Patient Instructions (Addendum)
 Change CPAP pressure to Auto CPAP 7 to 13cmH2O and ensure you follow up with pulmonology.   You Bently stop metformin .  In its place we will use Ozempic   Start ozempic  0.25mg  once per week once per week injected subcutaneously ( Jaime Tate)  in stomach. Please clean with alcohol swab prior to injection and be sure to rotate site. You Dray schedule a nurse visit if you would like to first injection.   After 4 weeks, and if tolerated and weight loss has not reached 1-2 lbs per week, please increase to 0.5mg  once per week Dixon.    Please read information on medication below and remember black box warning that you Thane not take if you or a family member is diagnosed with thyroid  cancer (medullary thyroid  cancer), or multiple endocrine neoplasia ( MEN).       Semaglutide  injection solution What is this medicine? SEMAGLUTIDE  (Sem a GLOO tide) is used to improve blood sugar control in adults with type 2 diabetes. This medicine Casamento be used with other diabetes medicines. This drug Klang also reduce the risk of heart attack or stroke if you have type 2 diabetes and risk factors for heart disease. This medicine Gaul be used for other purposes; ask your health care provider or pharmacist if you have questions. COMMON BRAND NAME(S): OZEMPIC  What should I tell my health care provider before I take this medicine? They need to know if you have any of these conditions: endocrine tumors (MEN 2) or if someone in your family had these tumors eye disease, vision problems history of pancreatitis kidney disease stomach problems thyroid  cancer or if someone in your family had thyroid  cancer an unusual or allergic reaction to semaglutide , other medicines, foods, dyes, or preservatives pregnant or trying to get pregnant breast-feeding How should I use this medicine? This medicine is for injection under the skin of your upper leg (thigh), stomach area, or upper arm. It is given once every week (every 7 days). You will be taught  how to prepare and give this medicine. Use exactly as directed. Take your medicine at regular intervals. Do not take it more often than directed. If you use this medicine with insulin , you should inject this medicine and the insulin  separately. Do not mix them together. Do not give the injections right next to each other. Change (rotate) injection sites with each injection. It is important that you put your used needles and syringes in a special sharps container. Do not put them in a trash can. If you do not have a sharps container, call your pharmacist or healthcare provider to get one. A special MedGuide will be given to you by the pharmacist with each prescription and refill. Be sure to read this information carefully each time. This drug comes with INSTRUCTIONS FOR USE. Ask your pharmacist for directions on how to use this drug. Read the information carefully. Talk to your pharmacist or health care provider if you have questions. Talk to your pediatrician regarding the use of this medicine in children. Special care Toure be needed. Overdosage: If you think you have taken too much of this medicine contact a poison control center or emergency room at once. NOTE: This medicine is only for you. Do not share this medicine with others. What if I miss a dose? If you miss a dose, take it as soon as you can within 5 days after the missed dose. Then take your next dose at your regular weekly time. If it has been longer than  5 days after the missed dose, do not take the missed dose. Take the next dose at your regular time. Do not take double or extra doses. If you have questions about a missed dose, contact your health care provider for advice. What Heninger interact with this medicine? other medicines for diabetes Many medications Peachey cause changes in blood sugar, these include: alcohol containing beverages antiviral medicines for HIV or AIDS aspirin and aspirin-like drugs certain medicines for blood pressure,  heart disease, irregular heart beat chromium diuretics female hormones, such as estrogens or progestins, birth control pills fenofibrate gemfibrozil isoniazid lanreotide female hormones or anabolic steroids MAOIs like Carbex, Eldepryl, Marplan, Nardil, and Parnate medicines for weight loss medicines for allergies, asthma, cold, or cough medicines for depression, anxiety, or psychotic disturbances niacin nicotine NSAIDs, medicines for pain and inflammation, like ibuprofen or naproxen octreotide pasireotide pentamidine phenytoin probenecid quinolone antibiotics such as ciprofloxacin, levofloxacin, ofloxacin some herbal dietary supplements steroid medicines such as prednisone  or cortisone sulfamethoxazole; trimethoprim thyroid  hormones Some medications can hide the warning symptoms of low blood sugar (hypoglycemia). You Kopera need to monitor your blood sugar more closely if you are taking one of these medications. These include: beta-blockers, often used for high blood pressure or heart problems (examples include atenolol, metoprolol, propranolol) clonidine guanethidine reserpine This list Schlabach not describe all possible interactions. Give your health care provider a list of all the medicines, herbs, non-prescription drugs, or dietary supplements you use. Also tell them if you smoke, drink alcohol, or use illegal drugs. Some items Henshaw interact with your medicine. What should I watch for while using this medicine? Visit your doctor or health care professional for regular checks on your progress. Drink plenty of fluids while taking this medicine. Check with your doctor or health care professional if you get an attack of severe diarrhea, nausea, and vomiting. The loss of too much body fluid can make it dangerous for you to take this medicine. A test called the HbA1C (A1C) will be monitored. This is a simple blood test. It measures your blood sugar control over the last 2 to 3 months. You will  receive this test every 3 to 6 months. Learn how to check your blood sugar. Learn the symptoms of low and high blood sugar and how to manage them. Always carry a quick-source of sugar with you in case you have symptoms of low blood sugar. Examples include hard sugar candy or glucose tablets. Make sure others know that you can choke if you eat or drink when you develop serious symptoms of low blood sugar, such as seizures or unconsciousness. They must get medical help at once. Tell your doctor or health care professional if you have high blood sugar. You might need to change the dose of your medicine. If you are sick or exercising more than usual, you might need to change the dose of your medicine. Do not skip meals. Ask your doctor or health care professional if you should avoid alcohol. Many nonprescription cough and cold products contain sugar or alcohol. These can affect blood sugar. Pens should never be shared. Even if the needle is changed, sharing Escher result in passing of viruses like hepatitis or HIV. Wear a medical ID bracelet or chain, and carry a card that describes your disease and details of your medicine and dosage times. Do not become pregnant while taking this medicine. Women should inform their doctor if they wish to become pregnant or think they might be pregnant. There is a potential  for serious side effects to an unborn child. Talk to your health care professional or pharmacist for more information. What side effects Padovano I notice from receiving this medicine? Side effects that you should report to your doctor or health care professional as soon as possible: allergic reactions like skin rash, itching or hives, swelling of the face, lips, or tongue breathing problems changes in vision diarrhea that continues or is severe lump or swelling on the neck severe nausea signs and symptoms of infection like fever or chills; cough; sore throat; pain or trouble passing urine signs and  symptoms of low blood sugar such as feeling anxious, confusion, dizziness, increased hunger, unusually weak or tired, sweating, shakiness, cold, irritable, headache, blurred vision, fast heartbeat, loss of consciousness signs and symptoms of kidney injury like trouble passing urine or change in the amount of urine trouble swallowing unusual stomach upset or pain vomiting Side effects that usually do not require medical attention (report to your doctor or health care professional if they continue or are bothersome): constipation diarrhea nausea pain, redness, or irritation at site where injected stomach upset This list Waguespack not describe all possible side effects. Call your doctor for medical advice about side effects. You Schreckengost report side effects to FDA at 1-800-FDA-1088. Where should I keep my medicine? Keep out of the reach of children. Store unopened pens in a refrigerator between 2 and 8 degrees C (36 and 46 degrees F). Do not freeze. Protect from light and heat. After you first use the pen, it can be stored for 56 days at room temperature between 15 and 30 degrees C (59 and 86 degrees F) or in a refrigerator. Throw away your used pen after 56 days or after the expiration date, whichever comes first. Do not store your pen with the needle attached. If the needle is left on, medicine Pettitt leak from the pen. NOTE: This sheet is a summary. It Lizer not cover all possible information. If you have questions about this medicine, talk to your doctor, pharmacist, or health care provider.  2021 Elsevier/Gold Standard (2019-08-06 09:41:51)

## 2023-12-20 NOTE — Assessment & Plan Note (Signed)
 Lab Results  Component Value Date   HGBA1C 6.3 (A) 12/20/2023   Stop metformin  due to intolerance.  Start Ozempic  0.25 mg.  Counseled on blackbox warning as it relates to multiple endocrine neoplasia, medullary thyroid  cancer, side effects and titration.

## 2023-12-20 NOTE — Progress Notes (Signed)
 Assessment & Plan:  Diabetes mellitus without complication [facility]) Assessment & Plan: Lab Results  Component Value Date   HGBA1C 6.3 (A) 12/20/2023   Stop metformin  due to intolerance.  Start Ozempic  0.25 mg.  Counseled on blackbox warning as it relates to multiple endocrine neoplasia, medullary thyroid  cancer, side effects and titration.  Orders: -     Comprehensive metabolic panel -     POCT glycosylated hemoglobin (Hb A1C) -     Semaglutide (0.25 or 0.5MG /DOS); Inject 0.25 mg into the skin once a week.  Dispense: 3 mL; Refill: 1  Fatigue, unspecified type -     Vitamin B12 -     VITAMIN D  25 Hydroxy (Vit-D Deficiency, Fractures)  Hyperlipidemia, unspecified hyperlipidemia type -     Lipid panel  OSA (obstructive sleep apnea) Assessment & Plan: Question if suboptimal control in the setting of chronic fatigue, nonrestorative sleep.  She is  unsure if recent settings were updated after pulmonology follow-up in November.  Patient will reach out to pulmonology and medical equipment company.   Generalized anxiety disorder Assessment & Plan: Chronic, stable. Continue lexapro  10mg  qd      Return precautions given.   Risks, benefits, and alternatives of the medications and treatment plan prescribed today were discussed, and patient expressed understanding.   Education regarding symptom management and diagnosis given to patient on AVS either electronically or printed.  No follow-ups on file.  [name], FNP  Subjective:    Patient ID: Jaime Tate, female    DOB: 1976/11/13, 48 y.o.   MRN: [id number]  CC: Jaime Tate is a 48 y.o. female who presents today for follow up.   HPI: She doesn't feel well on metformin  and concern for being overall fatigue. She will feels nauseated and has more loose stools on metformin     She is interested in GLP-1      compliant with cipap which she is wearing 7-8 hours per night.   Last seen 10/19/23 and CPAP pressure  changed; she isnt sure if setting updated.   Sleep is not restorative.   No regular exercise.   Mammogram and colonoscopy are up to date  No family or personal h/o thyroid  cancer, MEN.   Never smoker  Lexapro  is working well. Denies depression   Allergies: Biaxin [clarithromycin] Current Outpatient Medications on File Prior to Visit  Medication Sig Dispense Refill   Acetaminophen (TYLENOL PO) Take by mouth.     Cholecalciferol (VITAMIN D3) 50 MCG (2000 UT) capsule Take 2,000 Units by mouth daily.     escitalopram  (LEXAPRO ) 10 MG tablet TAKE 1 TABLET BY MOUTH EVERY DAY 90 tablet 2   fexofenadine (ALLEGRA) 60 MG tablet Take 60 mg by mouth daily.     hydrocortisone  2.5 % cream Use as directed. Instruction in patient handout 30 g 3   Ibuprofen (ADVIL PO) Take by mouth as needed.     meloxicam  (MOBIC ) 7.5 MG tablet TAKE 1 TABLET BY MOUTH EVERY DAY AS NEEDED FOR PAIN 90 tablet 0   Misc Natural Products (ELDERBERRY ZINC/VIT C/IMMUNE MT) Use as directed in the mouth or throat as needed.     Omega 3 1000 MG CAPS Take 2,000 mg by mouth daily.     pantoprazole  (PROTONIX ) 20 MG tablet TAKE 1 TABLET (20 MG TOTAL) BY MOUTH EVERY MORNING. TAKE 30 MINUTES TO HOUR BEFORE BREAKFAST 90 tablet 3   Probiotic Product (PROBIOTIC ADVANCED PO) Take by mouth.     rosuvastatin  (CRESTOR ) 5 MG  tablet TAKE 1 TABLET BY MOUTH EVERY DAY IN THE EVENING 90 tablet 3   No current facility-administered medications on file prior to visit.    Review of Systems  Constitutional:  Positive for fatigue. Negative for chills and fever.  Respiratory:  Negative for cough.   Cardiovascular:  Negative for chest pain and palpitations.  Gastrointestinal:  Negative for nausea and vomiting.      Objective:    BP 130/72   Pulse 71   Temp 98.2 F (36.8 C) (Oral)   Ht 5\' 4"  (1.626 m)   Wt 231 lb 12.8 oz (105.1 kg)   LMP  (LMP Unknown)   SpO2 99%   BMI 39.79 kg/m  BP Readings from Last 3 Encounters:  12/20/23 130/72   10/19/23 110/80  08/18/23 130/76   Wt Readings from Last 3 Encounters:  12/20/23 231 lb 12.8 oz (105.1 kg)  10/19/23 233 lb (105.7 kg)  08/18/23 238 lb 6.4 oz (108.1 kg)      12/20/2023   11:56 AM 08/18/2023    9:13 AM 04/11/2023    2:47 PM  Depression screen PHQ 2/9  Decreased Interest 0 0 0  Down, Depressed, Hopeless 0 0 0  PHQ - 2 Score 0 0 0  Altered sleeping   0  Tired, decreased energy   0  Change in appetite   0  Feeling bad or failure about yourself    0  Trouble concentrating   0  Moving slowly or fidgety/restless   0  Suicidal thoughts   0  PHQ-9 Score   0  Difficult doing work/chores   Not difficult at all     Physical Exam Vitals reviewed.  Constitutional:      Appearance: She is well-developed.  Eyes:     Conjunctiva/sclera: Conjunctivae normal.  Neck:     Thyroid : No thyroid  mass or thyromegaly.  Cardiovascular:     Rate and Rhythm: Normal rate and regular rhythm.     Pulses: Normal pulses.     Heart sounds: Normal heart sounds.  Pulmonary:     Effort: Pulmonary effort is normal.     Breath sounds: Normal breath sounds. No wheezing, rhonchi or rales.  Lymphadenopathy:     Head:     Right side of head: No submental, submandibular, tonsillar, preauricular, posterior auricular or occipital adenopathy.     Left side of head: No submental, submandibular, tonsillar, preauricular, posterior auricular or occipital adenopathy.     Cervical: No cervical adenopathy.  Skin:    General: Skin is warm and dry.  Neurological:     Mental Status: She is alert.  Psychiatric:        Speech: Speech normal.        Behavior: Behavior normal.        Thought Content: Thought content normal.

## 2023-12-20 NOTE — Assessment & Plan Note (Signed)
Chronic, stable. Continue lexapro 10mg  qd

## 2023-12-23 DIAGNOSIS — G4733 Obstructive sleep apnea (adult) (pediatric): Secondary | ICD-10-CM | POA: Diagnosis not present

## 2023-12-28 ENCOUNTER — Other Ambulatory Visit: Payer: Self-pay | Admitting: Family

## 2023-12-28 DIAGNOSIS — M79671 Pain in right foot: Secondary | ICD-10-CM

## 2024-01-12 ENCOUNTER — Other Ambulatory Visit (INDEPENDENT_AMBULATORY_CARE_PROVIDER_SITE_OTHER): Payer: BC Managed Care – PPO

## 2024-01-12 DIAGNOSIS — E538 Deficiency of other specified B group vitamins: Secondary | ICD-10-CM | POA: Diagnosis not present

## 2024-01-15 LAB — CELIAC DISEASE AB SCREEN W/RFX
Antigliadin Abs, IgA: 2 U (ref 0–19)
IgA/Immunoglobulin A, Serum: 163 mg/dL (ref 87–352)

## 2024-01-16 LAB — HOMOCYSTEINE: Homocysteine: 7.8 umol/L (ref <10.4)

## 2024-01-16 LAB — INTRINSIC FACTOR ANTIBODIES: Intrinsic Factor: NEGATIVE

## 2024-01-16 LAB — METHYLMALONIC ACID, SERUM: Methylmalonic Acid, Quant: 131 nmol/L (ref 55–335)

## 2024-01-17 ENCOUNTER — Encounter: Payer: Self-pay | Admitting: Family

## 2024-01-17 DIAGNOSIS — E538 Deficiency of other specified B group vitamins: Secondary | ICD-10-CM | POA: Insufficient documentation

## 2024-02-08 DIAGNOSIS — E119 Type 2 diabetes mellitus without complications: Secondary | ICD-10-CM | POA: Diagnosis not present

## 2024-02-15 ENCOUNTER — Other Ambulatory Visit: Payer: Self-pay | Admitting: Family

## 2024-02-15 DIAGNOSIS — E119 Type 2 diabetes mellitus without complications: Secondary | ICD-10-CM

## 2024-02-16 ENCOUNTER — Encounter: Payer: BC Managed Care – PPO | Admitting: Family

## 2024-02-20 ENCOUNTER — Other Ambulatory Visit (HOSPITAL_COMMUNITY)
Admission: RE | Admit: 2024-02-20 | Discharge: 2024-02-20 | Disposition: A | Discharge status: Home / Self Care | Source: Ambulatory Visit | Attending: Family | Admitting: Family

## 2024-02-20 ENCOUNTER — Ambulatory Visit: Payer: BC Managed Care – PPO | Admitting: Family

## 2024-02-20 ENCOUNTER — Encounter: Payer: Self-pay | Admitting: Family

## 2024-02-20 VITALS — BP 130/70 | HR 90 | Temp 98.8°F | Ht 63.5 in | Wt 225.8 lb

## 2024-02-20 DIAGNOSIS — R7309 Other abnormal glucose: Secondary | ICD-10-CM

## 2024-02-20 DIAGNOSIS — Z0000 Encounter for general adult medical examination without abnormal findings: Secondary | ICD-10-CM

## 2024-02-20 DIAGNOSIS — Z124 Encounter for screening for malignant neoplasm of cervix: Secondary | ICD-10-CM | POA: Diagnosis not present

## 2024-02-20 DIAGNOSIS — E538 Deficiency of other specified B group vitamins: Secondary | ICD-10-CM | POA: Diagnosis not present

## 2024-02-20 DIAGNOSIS — Z23 Encounter for immunization: Secondary | ICD-10-CM | POA: Diagnosis not present

## 2024-02-20 DIAGNOSIS — S61219A Laceration without foreign body of unspecified finger without damage to nail, initial encounter: Secondary | ICD-10-CM | POA: Insufficient documentation

## 2024-02-20 DIAGNOSIS — Z7985 Long-term (current) use of injectable non-insulin antidiabetic drugs: Secondary | ICD-10-CM

## 2024-02-20 DIAGNOSIS — S61211A Laceration without foreign body of left index finger without damage to nail, initial encounter: Secondary | ICD-10-CM

## 2024-02-20 DIAGNOSIS — Z0001 Encounter for general adult medical examination with abnormal findings: Secondary | ICD-10-CM

## 2024-02-20 DIAGNOSIS — E119 Type 2 diabetes mellitus without complications: Secondary | ICD-10-CM | POA: Diagnosis not present

## 2024-02-20 LAB — MICROALBUMIN / CREATININE URINE RATIO
Creatinine,U: 258.1 mg/dL
Microalb Creat Ratio: 6.2 mg/g (ref 0.0–30.0)
Microalb, Ur: 1.6 mg/dL (ref 0.0–1.9)

## 2024-02-20 NOTE — Progress Notes (Unsigned)
 Assessment & Plan:  There are no diagnoses linked to this encounter.   Return precautions given.   Risks, benefits, and alternatives of the medications and treatment plan prescribed today were discussed, and patient expressed understanding.   Education regarding symptom management and diagnosis given to patient on AVS either electronically or printed.  No follow-ups on file.  Jaime Plowman, FNP  Subjective:    Patient ID: Jaime Tate, female    DOB: 05-13-1976, 48 y.o.   MRN: [id number]  CC: Jaime Tate is a 48 y.o. female who presents today for physical exam.    HPI: Fatigue has resolved.   She is compliant with PO b12  She is compliant with ozempic 0.5mg . Episodic abdominal cramping, nausea the day after injection, which resolves.   Denies constipation.   She cut left 2nd finger on door strike plate   No fever, purulent discharge.   Cleaned and have kept bandage , with steri strips.   Tdap 2020.      Colorectal Cancer Screening: UTD , 04/05/22; repeat in 10 years Breast Cancer Screening: Mammogram UTD Cervical Cancer Screening: due; 02/26/2021 negative malignancy, negative HPV          Tetanus - UTD 09/06/2019          Exercise: Gets regular exercise, walking.   Alcohol use: Occasional Smoking/tobacco use: Nonsmoker.    Health Maintenance  Topic Date Due   Yearly kidney health urinalysis for diabetes  01/19/2024   Complete foot exam   01/19/2024   Pneumococcal Vaccination (1 of 2 - PCV) 02/19/2025*   Hemoglobin A1C  06/18/2024   Yearly kidney function blood test for diabetes  12/19/2024   Eye exam for diabetics  02/07/2025   Pap with HPV screening  02/26/2026   DTaP/Tdap/Td vaccine (2 - Td or Tdap) 09/30/2029   Colon Cancer Screening  04/05/2032   Flu Shot  Completed   COVID-19 Vaccine  Completed   Hepatitis C Screening  Completed   HIV Screening  Completed   HPV Vaccine  Aged Out  *Topic was postponed. The date shown is not the original  due date.    ALLERGIES: Biaxin [clarithromycin]  Current Outpatient Medications on File Prior to Visit  Medication Sig Dispense Refill   Acetaminophen (TYLENOL PO) Take by mouth.     Cholecalciferol (VITAMIN D3) 50 MCG (2000 UT) capsule Take 2,000 Units by mouth daily.     escitalopram (LEXAPRO) 10 MG tablet TAKE 1 TABLET BY MOUTH EVERY DAY 90 tablet 2   fexofenadine (ALLEGRA) 60 MG tablet Take 60 mg by mouth daily.     hydrocortisone 2.5 % cream Use as directed. Instruction in patient handout 30 g 3   Ibuprofen (ADVIL PO) Take by mouth as needed.     meloxicam (MOBIC) 7.5 MG tablet TAKE 1 TABLET BY MOUTH EVERY DAY AS NEEDED FOR PAIN 90 tablet 0   Misc Natural Products (ELDERBERRY ZINC/VIT C/IMMUNE MT) Use as directed in the mouth or throat as needed.     Omega 3 1000 MG CAPS Take 2,000 mg by mouth daily.     pantoprazole (PROTONIX) 20 MG tablet TAKE 1 TABLET (20 MG TOTAL) BY MOUTH EVERY MORNING. TAKE 30 MINUTES TO HOUR BEFORE BREAKFAST 90 tablet 3   Probiotic Product (PROBIOTIC ADVANCED PO) Take by mouth.     rosuvastatin (CRESTOR) 5 MG tablet TAKE 1 TABLET BY MOUTH EVERY DAY IN THE EVENING 90 tablet 3   Semaglutide,0.25 or 0.5MG /DOS, (OZEMPIC, 0.25 OR 0.5  MG/DOSE,) 2 MG/3ML SOPN INJECT 0.25MG  INTO THE SKIN ONE TIME PER WEEK 3 ML 56 DAY SUPPLY 4 mL 1   No current facility-administered medications on file prior to visit.    Review of Systems    Objective:    BP 130/70   Pulse 90   Temp 98.8 F (37.1 C) (Oral)   Ht 5' 3.5" (1.613 m)   Wt 225 lb 12.8 oz (102.4 kg)   LMP  (LMP Unknown)   SpO2 95%   BMI 39.37 kg/m   BP Readings from Last 3 Encounters:  02/20/24 130/70  12/20/23 130/72  10/19/23 110/80   Wt Readings from Last 3 Encounters:  02/20/24 225 lb 12.8 oz (102.4 kg)  12/20/23 231 lb 12.8 oz (105.1 kg)  10/19/23 233 lb (105.7 kg)    Physical Exam Vitals reviewed.  Constitutional:      Appearance: Normal appearance. She is well-developed.  Eyes:      Conjunctiva/sclera: Conjunctivae normal.  Neck:     Thyroid: No thyroid mass or thyromegaly.  Cardiovascular:     Rate and Rhythm: Normal rate and regular rhythm.     Pulses: Normal pulses.     Heart sounds: Normal heart sounds.  Pulmonary:     Effort: Pulmonary effort is normal.     Breath sounds: Normal breath sounds. No wheezing, rhonchi or rales.  Chest:  Breasts:    Breasts are symmetrical.     Right: No inverted nipple, mass, nipple discharge, skin change or tenderness.     Left: No inverted nipple, mass, nipple discharge, skin change or tenderness.  Abdominal:     General: Bowel sounds are normal. There is no distension.     Palpations: Abdomen is soft. Abdomen is not rigid. There is no fluid wave or mass.     Tenderness: There is no abdominal tenderness. There is no guarding or rebound.  Genitourinary:    Cervix: No cervical motion tenderness, discharge or friability.     Uterus: Not enlarged, not fixed and not tender.      Adnexa:        Right: No mass, tenderness or fullness.         Left: No mass, tenderness or fullness.       Comments: Pap performed. No CMT. Unable to appreciated ovaries. Lymphadenopathy:     Head:     Right side of head: No submental, submandibular, tonsillar, preauricular, posterior auricular or occipital adenopathy.     Left side of head: No submental, submandibular, tonsillar, preauricular, posterior auricular or occipital adenopathy.     Cervical:     Right cervical: No superficial, deep or posterior cervical adenopathy.    Left cervical: No superficial, deep or posterior cervical adenopathy.     Upper Body:     Right upper body: No pectoral adenopathy.     Left upper body: No pectoral adenopathy.  Skin:    General: Skin is warm and dry.     Comments: Approx 4 cm well approximated clean laceration medial side of 2nd left finger.  No purulent discharge,erythema, red streaks.  Steri-Strips intact  Neurological:     Mental Status: She is alert.   Psychiatric:        Speech: Speech normal.        Behavior: Behavior normal.        Thought Content: Thought content normal.

## 2024-02-20 NOTE — Patient Instructions (Addendum)
 Schedule b12 and A1c lab in 8-12 weeks; this is non fasting    Nice to see you!  Health Maintenance for Postmenopausal Women Menopause is a normal process in which your ability to get pregnant comes to an end. This process happens slowly over many months or years, usually between the ages of 71 and 29. Menopause is complete when you have missed your menstrual period for 12 months. It is important to talk with your health care provider about some of the most common conditions that affect women after menopause (postmenopausal women). These include heart disease, cancer, and bone loss (osteoporosis). Adopting a healthy lifestyle and getting preventive care can help to promote your health and wellness. The actions you take can also lower your chances of developing some of these common conditions. What are the signs and symptoms of menopause? During menopause, you Henton have the following symptoms: Hot flashes. These can be moderate or severe. Night sweats. Decrease in sex drive. Mood swings. Headaches. Tiredness (fatigue). Irritability. Memory problems. Problems falling asleep or staying asleep. Talk with your health care provider about treatment options for your symptoms. Do I need hormone replacement therapy? Hormone replacement therapy is effective in treating symptoms that are caused by menopause, such as hot flashes and night sweats. Hormone replacement carries certain risks, especially as you become older. If you are thinking about using estrogen or estrogen with progestin, discuss the benefits and risks with your health care provider. How can I reduce my risk for heart disease and stroke? The risk of heart disease, heart attack, and stroke increases as you age. One of the causes [place] be a change in the body's hormones during menopause. This can affect how your body uses dietary fats, triglycerides, and cholesterol. Heart attack and stroke are medical emergencies. There are many things that  you can do to help prevent heart disease and stroke. Watch your blood pressure High blood pressure causes heart disease and increases the risk of stroke. This is more likely to develop in people who have high blood pressure readings or are overweight. Have your blood pressure checked: Every 3-5 years if you are 60-51 years of age. Every year if you are 31 years old or older. Eat a healthy diet  Eat a diet that includes plenty of vegetables, fruits, low-fat dairy products, and lean protein. Do not eat a lot of foods that are high in solid fats, added sugars, or sodium. Get regular exercise Get regular exercise. This is one of the most important things you can do for your health. Most adults should: Try to exercise for at least 150 minutes each week. The exercise should increase your heart rate and make you sweat (moderate-intensity exercise). Try to do strengthening exercises at least twice each week. Do these in addition to the moderate-intensity exercise. Spend less time sitting. Even light physical activity can be beneficial. Other tips Work with your health care provider to achieve or maintain a healthy weight. Do not use any products that contain nicotine or tobacco. These products include cigarettes, chewing tobacco, and vaping devices, such as e-cigarettes. If you need help quitting, ask your health care provider. Know your numbers. Ask your health care provider to check your cholesterol and your blood sugar (glucose). Continue to have your blood tested as directed by your health care provider. Do I need screening for cancer? Depending on your health history and family history, you Krol need to have cancer screenings at different stages of your life. This [place] include screening  for: Breast cancer. Cervical cancer. Lung cancer. Colorectal cancer. What is my risk for osteoporosis? After menopause, you Baccam be at increased risk for osteoporosis. Osteoporosis is a condition in which bone  destruction happens more quickly than new bone creation. To help prevent osteoporosis or the bone fractures that can happen because of osteoporosis, you Cameron take the following actions: If you are 75-90 years old, get at least 1,000 mg of calcium and at least 600 international units (IU) of vitamin D per day. If you are older than age 70 but younger than age 25, get at least 1,200 mg of calcium and at least 600 international units (IU) of vitamin D per day. If you are older than age 28, get at least 1,200 mg of calcium and at least 800 international units (IU) of vitamin D per day. Smoking and drinking excessive alcohol increase the risk of osteoporosis. Eat foods that are rich in calcium and vitamin D, and do weight-bearing exercises several times each week as directed by your health care provider. How does menopause affect my mental health? Depression Storey occur at any age, but it is more common as you become older. Common symptoms of depression include: Feeling depressed. Changes in sleep patterns. Changes in appetite or eating patterns. Feeling an overall lack of motivation or enjoyment of activities that you previously enjoyed. Frequent crying spells. Talk with your health care provider if you think that you are experiencing any of these symptoms. General instructions See your health care provider for regular wellness exams and vaccines. This [place]all include: Scheduling regular health, dental, and eye exams. Getting and maintaining your vaccines. These include: Influenza vaccine. Get this vaccine each year before the flu season begins. Pneumonia vaccine. Shingles vaccine. Tetanus, diphtheria, and pertussis (Tdap) booster vaccine. Your health care provider [place] also recommend other immunizations. Tell your health care provider if you have ever been abused or do not feel safe at home. Summary Menopause is a normal process in which your ability to get pregnant comes to an end. This condition  causes hot flashes, night sweats, decreased interest in sex, mood swings, headaches, or lack of sleep. Treatment for this condition Feng include hormone replacement therapy. Take actions to keep yourself healthy, including exercising regularly, eating a healthy diet, watching your weight, and checking your blood pressure and blood sugar levels. Get screened for cancer and depression. Make sure that you are up to date with all your vaccines. This information is not intended to replace advice given to you by your health care provider. Make sure you discuss any questions you have with your health care provider. Document Revised: 04/12/2021 Document Reviewed: 04/12/2021 Elsevier Patient Education  2024 [employer].

## 2024-02-20 NOTE — Assessment & Plan Note (Signed)
 Well-approximated.  No signs of infection.  Tdap given

## 2024-02-21 LAB — CYTOLOGY - PAP
Comment: NEGATIVE
Diagnosis: NEGATIVE
High risk HPV: NEGATIVE

## 2024-02-21 NOTE — Assessment & Plan Note (Signed)
  No longer on metformin due to intolerance.  Continue Ozempic 0.5 mg.  Discussed episodic nausea on Ozempic; her preference is to continue medication, we discussed if nausea were to worsen. We discussed being very slow with dose titration. Close monitoring.

## 2024-02-21 NOTE — Assessment & Plan Note (Signed)
 Breast exam performed.  Pap smear obtained.  Tdap given d/t laceration.  Encouraged walking.

## 2024-02-21 NOTE — Assessment & Plan Note (Signed)
 Fatigue has resolved with B12 segmentation.  Will monitor

## 2024-02-23 ENCOUNTER — Other Ambulatory Visit: Payer: Self-pay | Admitting: Family

## 2024-02-23 ENCOUNTER — Encounter: Payer: Self-pay | Admitting: Family

## 2024-02-23 DIAGNOSIS — B3731 Acute candidiasis of vulva and vagina: Secondary | ICD-10-CM

## 2024-02-23 MED ORDER — FLUCONAZOLE 150 MG PO TABS: ORAL_TABLET | ORAL | 0 refills | Status: DC

## 2024-03-11 ENCOUNTER — Other Ambulatory Visit: Payer: Self-pay

## 2024-03-11 ENCOUNTER — Encounter: Payer: Self-pay | Admitting: Family

## 2024-03-11 MED ORDER — SEMAGLUTIDE (1 MG/DOSE) 4 MG/3ML ~~LOC~~ SOPN: 1.0000 mg | PEN_INJECTOR | SUBCUTANEOUS | 2 refills | Status: DC

## 2024-03-14 ENCOUNTER — Encounter: Payer: Self-pay | Admitting: Nurse Practitioner

## 2024-03-14 ENCOUNTER — Ambulatory Visit: Admitting: Nurse Practitioner

## 2024-03-14 VITALS — BP 124/86 | HR 83 | Temp 98.0°F | Ht 63.5 in | Wt 225.2 lb

## 2024-03-14 DIAGNOSIS — R059 Cough, unspecified: Secondary | ICD-10-CM | POA: Insufficient documentation

## 2024-03-14 DIAGNOSIS — R058 Other specified cough: Secondary | ICD-10-CM | POA: Diagnosis not present

## 2024-03-14 MED ORDER — BENZONATATE 100 MG PO CAPS: 100.0000 mg | ORAL_CAPSULE | Freq: Three times a day (TID) | ORAL | 0 refills | Status: DC | PRN

## 2024-03-14 MED ORDER — AMOXICILLIN-POT CLAVULANATE 875-125 MG PO TABS: 1.0000 | ORAL_TABLET | Freq: Two times a day (BID) | ORAL | 0 refills | Status: DC

## 2024-03-14 MED ORDER — PREDNISONE 20 MG PO TABS: 20.0000 mg | ORAL_TABLET | Freq: Every day | ORAL | 0 refills | Status: AC

## 2024-03-14 NOTE — Assessment & Plan Note (Signed)
 Vital signs stable, patient in is no sign of acute distress, nontoxic.  Discussed findings with the patient.  Symptoms present for more than 1 week with no sign of resolution.   -Will treat with Augmentin, prednisone and tessalon perls. Take probiotic to reduce GI side effects.  -Increase fluid intake, rest. -Will let us know if symptoms not improving.

## 2024-03-14 NOTE — Patient Instructions (Signed)
 Start Augmentin twice a day, take probiotics to reduce GI side effect. Prednisone in the morning and tessalon perls as needed for cough.   Take plain Mucinex and continue antihistamine. Incresae fluid intake and rest. Use steam and humidifier.

## 2024-03-14 NOTE — Progress Notes (Signed)
 Established Patient Office Visit  Subjective:  Patient ID: Jaime Tate, female    DOB: November 14, 1976  Age: 48 y.o. MRN: [id number]  CC:  Chief Complaint  Patient presents with   Acute Visit    Persistent wet cough x 1 week Sinus pain Headache from coughing SOB Popping sensation in top of chest    HPI  Jaime Tate presents for cough  Cough This is a new problem. The current episode started 1 to 4 weeks ago. The cough is Productive of sputum. Associated symptoms include headaches, postnasal drip and shortness of breath (mild with excertion). Pertinent negatives include no chest pain, ear congestion or fever. Associated symptoms comments: Sneezing. The symptoms are aggravated by pollens and lying down. Treatments tried: mucinex. The treatment provided mild relief.     Past Medical History:  Diagnosis Date   Allergy    Seasonal   Anxiety    Chicken pox    GERD (gastroesophageal reflux disease)    Vaginal delivery     Past Surgical History:  Procedure Laterality Date   COLONOSCOPY WITH PROPOFOL N/A 04/05/2022   Procedure: COLONOSCOPY WITH PROPOFOL;  Surgeon: Midge Minium, MD;  Location: [facility] ENDOSCOPY;  Service: Endoscopy;  Laterality: N/A;   ESOPHAGOGASTRODUODENOSCOPY (EGD) WITH PROPOFOL      Family History  Problem Relation Age of Onset   Arthritis Mother    Osteoporosis Mother    High Cholesterol Mother    Heart disease Father 67   Hypertension Father    Diabetes Father    Basal cell carcinoma Father    Cancer Maternal Grandmother 26       Breast   Breast cancer Maternal Grandmother 70   Cancer Paternal Grandmother 7       Colon   Lung cancer Paternal Grandfather    Melanoma Paternal Grandfather    Ovarian cancer Neg Hx    Thyroid cancer Neg Hx     Social History   Socioeconomic History   Marital status: Married    Spouse name: Not on file   Number of children: Not on file   Years of education: Not on file   Highest education level: Master's  degree (e.g., MA, MS, MEng, MEd, MSW, MBA)  Occupational History   Not on file  Tobacco Use   Smoking status: Never   Smokeless tobacco: Never  Vaping Use   Vaping status: Never Used  Substance and Sexual Activity   Alcohol use: Yes    Comment: Occasionally, maybe once a month   Drug use: No   Sexual activity: Yes    Partners: Male    Birth control/protection: Surgical    Comment: Husband- Vasectomy  Other Topics Concern   Not on file  Social History Narrative   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, mental health    Lives with husband and 1 son (10 yo in August)    Pets: Cat    Coffee- 1 cup coffee, 1 occasional unsweet tea, no soda    Highest level education- Masters degree   Enjoys read, Careers adviser, and cross stitch    Social Drivers of Health   Financial Resource Strain: Low Risk  (02/19/2024)   Overall Financial Resource Strain (CARDIA)    Difficulty of Paying Living Expenses: Not very hard  Food Insecurity: No Food Insecurity (02/19/2024)   Hunger Vital Sign    Worried About Running Out of Food in the Last Year: Never true    Ran Out of Food in the Last Year: Never  true  Transportation Needs: No Transportation Needs (02/19/2024)   PRAPARE - Administrator, Civil Service (Medical): No    Lack of Transportation (Non-Medical): No  Physical Activity: Insufficiently Active (02/19/2024)   Exercise Vital Sign    Days of Exercise per Week: 3 days    Minutes of Exercise per Session: 20 min  Stress: No Stress Concern Present (02/19/2024)   [employer] of Occupational Health - Occupational Stress Questionnaire    Feeling of Stress : Only a little  Social Connections: Socially Integrated (02/19/2024)   Social Connection and Isolation Panel [NHANES]    Frequency of Communication with Friends and Family: Twice a week    Frequency of Social Gatherings with Friends and Family: Once a week    Attends Religious Services: More than 4 times per year    Active Member of [employer] or  Organizations: Yes    Attends Engineer, structural: More than 4 times per year    Marital Status: Married  Catering manager Violence: Not on file     Outpatient Medications Prior to Visit  Medication Sig Dispense Refill   Acetaminophen (TYLENOL PO) Take by mouth.     Cholecalciferol (VITAMIN D3) 50 MCG (2000 UT) capsule Take 2,000 Units by mouth daily.     escitalopram (LEXAPRO) 10 MG tablet TAKE 1 TABLET BY MOUTH EVERY DAY 90 tablet 2   fexofenadine (ALLEGRA) 60 MG tablet Take 60 mg by mouth daily.     fluconazole (DIFLUCAN) 150 MG tablet [name], Houpt take one tablet PO 3 days later. 2 tablet 0   hydrocortisone 2.5 % cream Use as directed. Instruction in patient handout 30 g 3   Ibuprofen (ADVIL PO) Take by mouth as needed.     meloxicam (MOBIC) 7.5 MG tablet TAKE 1 TABLET BY MOUTH EVERY DAY AS NEEDED FOR PAIN 90 tablet 0   Misc Natural Products (ELDERBERRY ZINC/VIT C/IMMUNE MT) Use as directed in the mouth or throat as needed.     Omega 3 1000 MG CAPS Take 2,000 mg by mouth daily.     pantoprazole (PROTONIX) 20 MG tablet TAKE 1 TABLET (20 MG TOTAL) BY MOUTH EVERY MORNING. TAKE 30 MINUTES TO HOUR BEFORE BREAKFAST 90 tablet 3   Probiotic Product (PROBIOTIC ADVANCED PO) Take by mouth.     rosuvastatin (CRESTOR) 5 MG tablet TAKE 1 TABLET BY MOUTH EVERY DAY IN THE EVENING 90 tablet 3   Semaglutide, 1 MG/DOSE, 4 MG/3ML SOPN Inject 1 mg as directed once a week. 3 mL 2   No facility-administered medications prior to visit.    Allergies  Allergen Reactions   Biaxin [Clarithromycin] Diarrhea and Nausea And Vomiting    ROS Review of Systems  Constitutional:  Negative for fever.  HENT:  Positive for postnasal drip.   Respiratory:  Positive for cough and shortness of breath (mild with excertion).   Cardiovascular:  Negative for chest pain.  Neurological:  Positive for headaches.   Negative unless indicated in HPI.    Objective:    Physical  Exam Constitutional:      Appearance: Normal appearance.  HENT:     Right Ear: Tympanic membrane normal. Tympanic membrane is not erythematous.     Left Ear: Tympanic membrane normal. Tympanic membrane is not erythematous.     Nose:     Right Turbinates: Swollen.     Left Turbinates: Swollen.     Right Sinus: No maxillary sinus tenderness or  frontal sinus tenderness.     Left Sinus: No maxillary sinus tenderness or frontal sinus tenderness.     Mouth/Throat:     Mouth: Mucous membranes are moist.     Pharynx: Postnasal drip present. No pharyngeal swelling, oropharyngeal exudate or posterior oropharyngeal erythema.     Tonsils: No tonsillar exudate.  Cardiovascular:     Rate and Rhythm: Normal rate and regular rhythm.  Pulmonary:     Effort: Pulmonary effort is normal.     Breath sounds: Normal breath sounds. No stridor. No wheezing.  Neurological:     General: No focal deficit present.     Mental Status: She is alert and oriented to person, place, and time. Mental status is at baseline.  Psychiatric:        Mood and Affect: Mood normal.        Behavior: Behavior normal.        Thought Content: Thought content normal.        Judgment: Judgment normal.     BP 124/86   Pulse 83   Temp 98 F (36.7 C)   Ht 5' 3.5" (1.613 m)   Wt 225 lb 3.2 oz (102.2 kg)   LMP  (LMP Unknown)   SpO2 98%   BMI 39.27 kg/m  Wt Readings from Last 3 Encounters:  03/14/24 225 lb 3.2 oz (102.2 kg)  02/20/24 225 lb 12.8 oz (102.4 kg)  12/20/23 231 lb 12.8 oz (105.1 kg)     Health Maintenance  Topic Date Due   FOOT EXAM  01/19/2024   Pneumococcal Vaccine 10-78 Years old (1 of 2 - PCV) 02/19/2025 (Originally 11/19/1995)   HEMOGLOBIN A1C  06/18/2024   INFLUENZA VACCINE  07/05/2024   Diabetic kidney evaluation - eGFR measurement  12/19/2024   OPHTHALMOLOGY EXAM  02/07/2025   Diabetic kidney evaluation - Urine ACR  02/19/2025   Cervical Cancer Screening (HPV/Pap Cotest)  02/19/2029   Colonoscopy   04/05/2032   DTaP/Tdap/Td (3 - Td or Tdap) 02/19/2034   COVID-19 Vaccine  Completed   Hepatitis C Screening  Completed   HIV Screening  Completed   HPV VACCINES  Aged Out   Meningococcal B Vaccine  Aged Out    There are no preventive care reminders to display for this patient.  Lab Results  Component Value Date   TSH 2.50 08/18/2023   Lab Results  Component Value Date   WBC 9.5 02/04/2022   HGB 13.5 02/04/2022   HCT 42.5 02/04/2022   MCV 79.4 02/04/2022   PLT 364.0 02/04/2022   Lab Results  Component Value Date   NA 138 12/20/2023   K 4.1 12/20/2023   CO2 28 12/20/2023   GLUCOSE 97 12/20/2023   BUN 8 12/20/2023   CREATININE 0.72 12/20/2023   BILITOT 0.4 12/20/2023   ALKPHOS 91 12/20/2023   AST 16 12/20/2023   ALT 15 12/20/2023   PROT 7.5 12/20/2023   ALBUMIN 4.6 12/20/2023   CALCIUM 9.5 12/20/2023   GFR 99.80 12/20/2023   Lab Results  Component Value Date   CHOL 121 12/20/2023   Lab Results  Component Value Date   HDL 55.00 12/20/2023   Lab Results  Component Value Date   LDLCALC 48 12/20/2023   Lab Results  Component Value Date   TRIG 89.0 12/20/2023   Lab Results  Component Value Date   CHOLHDL 2 12/20/2023   Lab Results  Component Value Date   HGBA1C 6.3 (A) 12/20/2023      Assessment &  Plan:  Other cough Assessment & Plan: Vital signs stable, patient in is no sign of acute distress, nontoxic.  Discussed findings with the patient.  Symptoms present for more than 1 week with no sign of resolution.   -Will treat with Augmentin, prednisone and tessalon perls. Take probiotic to reduce GI side effects.  -Increase fluid intake, rest. -Will let us know if symptoms not improving.    Other orders -     Amoxicillin-Pot Clavulanate; Take 1 tablet by mouth 2 (two) times daily.  Dispense: 20 tablet; Refill: 0 -     predniSONE; Take 1 tablet (20 mg total) by mouth daily with breakfast for 5 days.  Dispense: 5 tablet; Refill: 0 -     Benzonatate;  Take 1 capsule (100 mg total) by mouth 3 (three) times daily as needed.  Dispense: 30 capsule; Refill: 0    Follow-up: No follow-ups on file.   Kara Dies, NP

## 2024-03-29 ENCOUNTER — Other Ambulatory Visit: Payer: Self-pay | Admitting: Family

## 2024-03-29 DIAGNOSIS — M79671 Pain in right foot: Secondary | ICD-10-CM

## 2024-04-07 ENCOUNTER — Other Ambulatory Visit: Payer: Self-pay | Admitting: Family

## 2024-04-11 ENCOUNTER — Encounter: Payer: Self-pay | Admitting: Family

## 2024-04-11 ENCOUNTER — Other Ambulatory Visit: Payer: Self-pay | Admitting: Family

## 2024-04-11 ENCOUNTER — Other Ambulatory Visit

## 2024-04-11 DIAGNOSIS — E538 Deficiency of other specified B group vitamins: Secondary | ICD-10-CM

## 2024-04-11 DIAGNOSIS — R7309 Other abnormal glucose: Secondary | ICD-10-CM

## 2024-04-11 DIAGNOSIS — R110 Nausea: Secondary | ICD-10-CM

## 2024-04-11 LAB — B12 AND FOLATE PANEL
Folate: 11 ng/mL (ref >5.9)
Vitamin B-12: 1228 pg/mL — ABNORMAL HIGH (ref 211–911)

## 2024-04-11 LAB — HEMOGLOBIN A1C: Hgb A1c MFr Bld: 6.1 % (ref 4.6–6.5)

## 2024-04-11 MED ORDER — ONDANSETRON 4 MG PO TBDP: 4.0000 mg | ORAL_TABLET | Freq: Three times a day (TID) | ORAL | 0 refills | Status: AC | PRN

## 2024-04-11 NOTE — Telephone Encounter (Signed)
 Noted.

## 2024-04-15 ENCOUNTER — Ambulatory Visit: Payer: Self-pay

## 2024-04-15 NOTE — Telephone Encounter (Addendum)
 Copied From CRM [id number]. Reason for Triage: Stomach issues, stabbing and diarhea, vomiting   04/15/24 1030- 1st attempt, no answer, call could not be completed as dialed  04/15/24 10:42-2nd attempt, no answer, "Call could not be completed as dialed"; will go ahead and route to clinic for f/u due to nature of call

## 2024-04-15 NOTE — Telephone Encounter (Signed)
 Copied from CRM [id number]. Topic: Clinical - Pink Word Triage >> Licea 12, 2025  9:20 AM Emylou G wrote: Reason for Triage: Stomach issues, stabbing and diarhea, vomiting   Chief Complaint: abd pain Symptoms: nausea, vomiting, and diarrhea Frequency: intermittent Pertinent Negatives: Patient denies fever Disposition: [] ED /[] Urgent Care (no appt availability in office) / [] Appointment(In office/virtual)/ []  [facility]/ [] Home Care/ [] Refused Recommended Disposition /[] [facility]/ []  Follow-up with PCP Additional Notes: aPatient reports ongoing GI upset since starting Ozempic . Reports over the last 3 weeks she has had 3 episodes of severe upper abd pain followed by diarrhea and vomiting and persistent nausea. Most recent episode was 2 days ago. Pt reports she noticed these symptoms after increasing her dose. Per patient she was previously advised to space out her dose to 10days vs 7 days so her next dose will be tomorrow. Pt sts that she will also drop her next dose back down. Pt declined scheduling appt for now. Wants to see if Daivd Dub, FNP has any more suggestions or feedback first.    Reason for Disposition . [1] MILD pain (e.g., does not interfere with normal activities) AND [2] pain comes and goes (cramps) AND [3] present > 48 hours  (Exception: This same abdominal pain is a chronic symptom recurrent or ongoing AND present > 4 weeks.)  Answer Assessment - Initial Assessment Questions 1. LOCATION: "Where does it hurt?"      Upper abdomen;   2. RADIATION: "Does the pain shoot anywhere else?" (e.g., chest, back)     No  3. ONSET: "When did the pain begin?" (e.g., minutes, hours or days ago)      3 episodes over passed 3 weeks  4. SUDDEN: "Gradual or sudden onset?"     Gradual  5. PATTERN "Does the pain come and go, or is it constant?"    - If it comes and goes: "How long does it last?" "Do you have pain now?"     (Note: Comes and goes means the pain is  intermittent. It goes away completely between bouts.)    - If constant: "Is it getting better, staying the same, or getting worse?"      (Note: Constant means the pain never goes away completely; most serious pain is constant and gets worse.)      Comes and goes  6. SEVERITY: "How bad is the pain?"  (e.g., Scale 1-10; mild, moderate, or severe)    - MILD (1-3): Doesn't interfere with normal activities, abdomen soft and not tender to touch.     - MODERATE (4-7): Interferes with normal activities or awakens from sleep, abdomen tender to touch.     - SEVERE (8-10): Excruciating pain, doubled over, unable to do any normal activities.       *No Answer* 7. RECURRENT SYMPTOM: "Have you ever had this type of stomach pain before?" If Yes, ask: "When was the last time?" and "What happened that time?"      No  8. CAUSE: "What do you think is causing the stomach pain?"     Concerned for reaction to GLP-1 medication  9. RELIEVING/AGGRAVATING FACTORS: "What makes it better or worse?" (e.g., antacids, bending or twisting motion, bowel movement)     Some relief after vomiting and throwing up  10. OTHER SYMPTOMS: "Do you have any other symptoms?" (e.g., back pain, diarrhea, fever, urination pain, vomiting)       Burping, diarrhea, and vomiting  11. PREGNANCY: "Is there any chance you  are pregnant?" "When was your last menstrual period?"       *No Answer*  Protocols used: Abdominal Pain - [facility]

## 2024-04-15 NOTE — Telephone Encounter (Signed)
 Pt was advised of the message below. Pt stated that she will use her 1 mg pen and only click up to the 0.5 mg dose. Pt has an appt scheduled for Monday. Med list has been updated.

## 2024-04-15 NOTE — Telephone Encounter (Signed)
 See triage note.

## 2024-04-15 NOTE — Telephone Encounter (Signed)
 Spoke with pt and she stated that she is not having any symptoms currently. She stated that she just wanted to call and let Daivd Dub know that she had another episode of diarrhea, vomiting and abdominal pain on Saturday. She wants to let her know because she is due for her next Ozempic  dose tomorrow evening, however tomorrow will be day 10 instead of day 7 and she would also like to go back down to the 0.5 mg dose but she wanted to make sure this was still okay for her to due since she just had another episode on Saturday.

## 2024-04-22 ENCOUNTER — Ambulatory Visit: Admitting: Family

## 2024-04-22 ENCOUNTER — Encounter: Payer: Self-pay | Admitting: Family

## 2024-04-22 VITALS — BP 128/76 | HR 76 | Temp 98.0°F | Ht 63.5 in | Wt 220.0 lb

## 2024-04-22 DIAGNOSIS — E119 Type 2 diabetes mellitus without complications: Secondary | ICD-10-CM

## 2024-04-22 DIAGNOSIS — G4733 Obstructive sleep apnea (adult) (pediatric): Secondary | ICD-10-CM | POA: Diagnosis not present

## 2024-04-22 DIAGNOSIS — Z7985 Long-term (current) use of injectable non-insulin antidiabetic drugs: Secondary | ICD-10-CM

## 2024-04-22 DIAGNOSIS — R1011 Right upper quadrant pain: Secondary | ICD-10-CM | POA: Diagnosis not present

## 2024-04-22 MED ORDER — TIRZEPATIDE 2.5 MG/0.5ML ~~LOC~~ SOAJ: 2.5000 mg | SUBCUTANEOUS | 2 refills | Status: DC

## 2024-04-22 NOTE — Progress Notes (Signed)
 Assessment & Plan:  Diabetes mellitus without complication (HCC) Assessment & Plan: Excellent glycemic control. Discussed side effects of ozempic . Advised to decrease to 0.25mg  to see if symptoms recur while awaiting PA for mounjaro. We also opted to trial mounjaro to see if better tolerated in all.   Orders: -     Tirzepatide; Inject 2.5 mg into the skin once a week.  Dispense: 2 mL; Refill: 2  RUQ pain Assessment & Plan: Symptoms overall improved. Differential AE from Ozempic  , cholelithiasis.   Pending RUQ US   Orders: -     US  ABDOMEN LIMITED RUQ (LIVER/GB); Future     Return precautions given.   Risks, benefits, and alternatives of the medications and treatment plan prescribed today were discussed, and patient expressed understanding.   Education regarding symptom management and diagnosis given to patient on AVS either electronically or printed.  Return in about 3 months (around 07/23/2024).  [name], FNP  Subjective:    Patient ID: Jaime Tate, female    DOB: 1976/08/31, 48 y.o.   MRN: [id number]  CC: Jaime Tate is a 48 y.o. female who presents today for follow up.   HPI: Here to discuss side effects of Ozempic  1mg    She had diarrhea, vomiting, right upper abdominal pain for a couple of days, resolved. . She reduced to 0.5mg  after calling our office and 3 days later, she had vomiting episode. She had alfredo and then thinks contributory to abdominal pain.   She had tolerated 0.5mg  previously for 2 month, last dose 6 days.    No fever, constipation.   Family h/o cholelithiasis.      Compliant with protonix  20mg  which controls symptoms.    Allergies: Biaxin [clarithromycin] Current Outpatient Medications on File Prior to Visit  Medication Sig Dispense Refill   Acetaminophen (TYLENOL PO) Take by mouth.     Cholecalciferol (VITAMIN D3) 50 MCG (2000 UT) capsule Take 2,000 Units by mouth daily.     escitalopram  (LEXAPRO ) 10 MG tablet TAKE 1  TABLET BY MOUTH EVERY DAY 90 tablet 2   fexofenadine (ALLEGRA) 60 MG tablet Take 60 mg by mouth daily.     fluconazole  (DIFLUCAN ) 150 MG tablet [name], [place] take one tablet PO 3 days later. 2 tablet 0   hydrocortisone  2.5 % cream Use as directed. Instruction in patient handout 30 g 3   meloxicam  (MOBIC ) 7.5 MG tablet TAKE 1 TABLET BY MOUTH EVERY DAY AS NEEDED FOR PAIN 90 tablet 0   Misc Natural Products (ELDERBERRY ZINC/VIT C/IMMUNE MT) Use as directed in the mouth or throat as needed.     Omega 3 1000 MG CAPS Take 2,000 mg by mouth daily.     ondansetron  (ZOFRAN -ODT) 4 MG disintegrating tablet Take 1 tablet (4 mg total) by mouth every 8 (eight) hours as needed for nausea or vomiting. 20 tablet 0   pantoprazole  (PROTONIX ) 20 MG tablet TAKE 1 TABLET (20 MG TOTAL) BY MOUTH EVERY MORNING. TAKE 30 MINUTES TO HOUR BEFORE BREAKFAST 90 tablet 3   Probiotic Product (PROBIOTIC ADVANCED PO) Take by mouth.     rosuvastatin  (CRESTOR ) 5 MG tablet TAKE 1 TABLET BY MOUTH EVERY DAY IN THE EVENING 90 tablet 3   No current facility-administered medications on file prior to visit.    Review of Systems  Constitutional:  Negative for chills and fever.  Respiratory:  Negative for cough.   Cardiovascular:  Negative for chest pain and palpitations.  Gastrointestinal:  Negative for abdominal distention, blood in stool, constipation, diarrhea, nausea (resolved) and vomiting (resolved).      Objective:    BP 128/76   Pulse 76   Temp 98 F (36.7 C) (Oral)   Ht 5' 3.5" (1.613 m)   Wt 220 lb (99.8 kg)   LMP  (LMP Unknown)   SpO2 98%   BMI 38.36 kg/m  BP Readings from Last 3 Encounters:  04/22/24 128/76  03/14/24 124/86  02/20/24 130/70   Wt Readings from Last 3 Encounters:  04/22/24 220 lb (99.8 kg)  03/14/24 225 lb 3.2 oz (102.2 kg)  02/20/24 225 lb 12.8 oz (102.4 kg)    Physical Exam Vitals reviewed.  Constitutional:      Appearance: She is well-developed.  Eyes:      Conjunctiva/sclera: Conjunctivae normal.  Cardiovascular:     Rate and Rhythm: Normal rate and regular rhythm.     Pulses: Normal pulses.     Heart sounds: Normal heart sounds.  Pulmonary:     Effort: Pulmonary effort is normal.     Breath sounds: Normal breath sounds. No wheezing, rhonchi or rales.  Abdominal:     General: There is no distension.     Palpations: There is no pulsatile mass.     Tenderness: There is abdominal tenderness in the right upper quadrant. There is no right CVA tenderness, left CVA tenderness, guarding or rebound. Positive signs include Murphy's sign (mildly positive). Negative signs include McBurney's sign.    Skin:    General: Skin is warm and dry.  Neurological:     Mental Status: She is alert.  Psychiatric:        Speech: Speech normal.        Behavior: Behavior normal.        Thought Content: Thought content normal.

## 2024-04-22 NOTE — Patient Instructions (Addendum)
 Decrease ozempic  to 0.25mg  to see how you tolerate while we await mounjaro  Pending ruq ultrasound  Let us  know if you dont hear back within a week in regards to an appointment being scheduled.   So that you are aware, if you are [facility] MyChart user , please pay attention to your MyChart messages as you Chargois receive a MyChart message with a phone number to call and schedule this test/appointment own your own from our referral coordinator. This is a new process so I do not want you to miss this message.  If you are not a MyChart user, you will receive a phone call.    If Florence Hunt is not covered through your insurance, you Trigueros go to the [email or web address] to complete information for savings card.   You Leone also use the link below.   [email or web address]  You Delorenzo take this savings to [facility], [facility] or [facility]. If you are unable to get mounjaro, please call to schedule an appointment with me so we can discuss alternative weight loss medications.   start Mounjaro 2.5mg  once per week injected subcutaneously ( [place])  in stomach. Please clean with alcohol swab prior to injection and be sure to rotate site. You Paola schedule a nurse visit if you would like to first injection.   After 4 weeks, and if tolerated and weight loss has not reached 1-2 lbs per week, please increase to 5mg  once per week [place].    Please read information on medication below and remember black box warning that you Bothun not take if you or a family member is diagnosed with thyroid  cancer (medullary thyroid  cancer), or multiple endocrine neoplasia.       Tirzepatide Injection Florence Hunt) What is this medication? TIRZEPATIDE (tir ZEP a tide) treats type 2 diabetes. It works by increasing insulin  levels in your body, which decreases your blood sugar (glucose). Changes to diet and exercise are often combined with this medication. This medicine Cosby be used for other purposes; ask your  health care provider or pharmacist if you have questions. COMMON BRAND NAME(S): MOUNJARO What should I tell my care team before I take this medication? They need to know if you have any of these conditions: Endocrine tumors (MEN 2) or if someone in your family had these tumors Eye disease, vision problems Gallbladder disease History of pancreatitis Kidney disease Stomach or intestine problems Thyroid  cancer or if someone in your family had thyroid  cancer An unusual or allergic reaction to tirzepatide, other medications, foods, dyes, or preservatives Pregnant or trying to get pregnant Breast-feeding How should I use this medication? This medication is injected under the skin. You will be taught how to prepare and give it. It is given once every week (every 7 days). Keep taking it unless your health care provider tells you to stop. If you use this medication with insulin , you should inject this medication and the insulin  separately. Do not mix them together. Do not give the injections right next to each other. Change (rotate) injection sites with each injection. This medication comes with INSTRUCTIONS FOR USE. Ask your pharmacist for directions on how to use this medication. Read the information carefully. Talk to your pharmacist or care team if you have questions. It is important that you put your used needles and syringes in a special sharps container. Do not put them in a trash can. If you do not have a sharps container, call your pharmacist or care team to get one. A special  MedGuide will be given to you by the pharmacist with each prescription and refill. Be sure to read this information carefully each time. Talk to your care team about the use of this medication in children. Special care Barkett be needed. Overdosage: If you think you have taken too much of this medicine contact a poison control center or emergency room at once. NOTE: This medicine is only for you. Do not share this medicine  with others. What if I miss a dose? If you miss a dose, take it as soon as you can unless it is more than 4 days (96 hours) late. If it is more than 4 days late, skip the missed dose. Take the next dose at the normal time. Do not take 2 doses within 3 days of each other. What Stolar interact with this medication? Alcohol containing beverages Antiviral medications for HIV or AIDS Aspirin and aspirin-like medications Beta-blockers like atenolol, metoprolol, propranolol Certain medications for blood pressure, heart disease, irregular heart beat Chromium Clonidine Diuretics Female hormones, such as estrogens or progestins, birth control pills Fenofibrate Gemfibrozil Guanethidine Isoniazid Lanreotide Female hormones or anabolic steroids MAOIs like Carbex, Eldepryl, Marplan, Nardil, and Parnate Medications for weight loss Medications for allergies, asthma, cold, or cough Medications for depression, anxiety, or psychotic disturbances Niacin Nicotine NSAIDs, medications for pain and inflammation, like ibuprofen or naproxen Octreotide Other medications for diabetes, like glyburide, glipizide, or glimepiride Pasireotide Pentamidine Phenytoin Probenecid Quinolone antibiotics such as ciprofloxacin, levofloxacin, ofloxacin Reserpine Some herbal dietary supplements Steroid medications such as prednisone  or cortisone Sulfamethoxazole; trimethoprim Thyroid  hormones Warfarin This list Weisensel not describe all possible interactions. Give your health care provider a list of all the medicines, herbs, non-prescription drugs, or dietary supplements you use. Also tell them if you smoke, drink alcohol, or use illegal drugs. Some items Bushard interact with your medicine. What should I watch for while using this medication? Visit your care team for regular checks on your progress. Drink plenty of fluids while taking this medication. Check with your care team if you get an attack of severe diarrhea, nausea, and  vomiting. The loss of too much body fluid can make it dangerous for you to take this medication. A test called the HbA1C (A1C) will be monitored. This is a simple blood test. It measures your blood sugar control over the last 2 to 3 months. You will receive this test every 3 to 6 months. Learn how to check your blood sugar. Learn the symptoms of low and high blood sugar and how to manage them. Always carry a quick-source of sugar with you in case you have symptoms of low blood sugar. Examples include hard sugar candy or glucose tablets. Make sure others know that you can choke if you eat or drink when you develop serious symptoms of low blood sugar, such as seizures or unconsciousness. They must get medical help at once. Tell your care team if you have high blood sugar. You might need to change the dose of your medication. If you are sick or exercising more than usual, you might need to change the dose of your medication. Do not skip meals. Ask your care team if you should avoid alcohol. Many nonprescription cough and cold products contain sugar or alcohol. These can affect blood sugar. Pens should never be shared. Even if the needle is changed, sharing Ezra result in passing of viruses like hepatitis or HIV. Wear a medical ID bracelet or chain, and carry a card that describes your disease and  details of your medication and dosage times. Birth control Dibert not work properly while you are taking this medication. If you take birth control pills by mouth, your care team Turnbough recommend another type of birth control for 4 weeks after you start this medication and for 4 weeks after each increase in your dose of this medication. Ask your care team which birth control methods you should use. What side effects Bonine I notice from receiving this medication? Side effects that you should report to your care team as soon as possible: Allergic reactions-skin rash, itching, hives, swelling of the face, lips, tongue, or  throat Change in vision Dehydration-increased thirst, dry mouth, feeling faint or lightheaded, headache, dark yellow or brown urine Gallbladder problems-severe stomach pain, nausea, vomiting, fever Kidney injury-decrease in the amount of urine, swelling of the ankles, hands, or feet Pancreatitis-severe stomach pain that spreads to your back or gets worse after eating or when touched, fever, nausea, vomiting Thyroid  cancer-new mass or lump in the neck, pain or trouble swallowing, trouble breathing, hoarseness Side effects that usually do not require medical attention (report these to your care team if they continue or are bothersome): Constipation Diarrhea Loss of Appetite Nausea Stomach pain Upset stomach Vomiting This list Gerety not describe all possible side effects. Call your doctor for medical advice about side effects. You Demby report side effects to FDA at 1-800-FDA-1088. Where should I keep my medication? Keep out of the reach of children and pets. Refrigeration (preferred): Store unopened pens in a refrigerator between 2 and 8 degrees C (36 and 46 degrees F). Keep it in the original carton until you are ready to take it. Do not freeze or use if the medication has been frozen. Protect from light. Get rid of any unused medication after the expiration date on the label. Room Temperature: The pen Legault be stored at room temperature below 30 degrees C (86 degrees F) for up to a total of 21 days if needed. Protect from light. Avoid exposure to extreme heat. If it is stored at room temperature, throw away any unused medication after 21 days or after it expires, whichever is first. The pen has glass parts. Handle it carefully. If you drop the pen on a hard surface, do not use it. Use a new pen for your injection. To get rid of medications that are no longer needed or have expired: Take the medication to a medication take-back program. Check with your pharmacy or law enforcement to find a  location. If you cannot return the medication, ask your pharmacist or care team how to get rid of this medication safely. NOTE: This sheet is a summary. It Iannuzzi not cover all possible information. If you have questions about this medicine, talk to your doctor, pharmacist, or health care provider.  2022 Elsevier/Gold Standard (2021-04-20 13:57:48)

## 2024-04-22 NOTE — Assessment & Plan Note (Signed)
 Symptoms overall improved. Differential AE from Ozempic  , cholelithiasis.   Pending RUQ US 

## 2024-04-22 NOTE — Assessment & Plan Note (Signed)
 Excellent glycemic control. Discussed side effects of ozempic . Advised to decrease to 0.25mg  to see if symptoms recur while awaiting PA for mounjaro. We also opted to trial mounjaro to see if better tolerated in all.

## 2024-04-23 ENCOUNTER — Ambulatory Visit
Admission: RE | Admit: 2024-04-23 | Discharge: 2024-04-23 | Disposition: A | Discharge status: Home / Self Care | Source: Ambulatory Visit | Attending: Family | Admitting: Family

## 2024-04-23 ENCOUNTER — Ambulatory Visit: Payer: Self-pay | Admitting: Family

## 2024-04-23 DIAGNOSIS — R1011 Right upper quadrant pain: Secondary | ICD-10-CM | POA: Insufficient documentation

## 2024-04-23 DIAGNOSIS — K828 Other specified diseases of gallbladder: Secondary | ICD-10-CM | POA: Diagnosis not present

## 2024-04-23 DIAGNOSIS — K8020 Calculus of gallbladder without cholecystitis without obstruction: Secondary | ICD-10-CM | POA: Diagnosis not present

## 2024-07-18 ENCOUNTER — Other Ambulatory Visit: Payer: Self-pay | Admitting: Family

## 2024-07-18 DIAGNOSIS — Z1231 Encounter for screening mammogram for malignant neoplasm of breast: Secondary | ICD-10-CM

## 2024-07-22 ENCOUNTER — Other Ambulatory Visit: Payer: Self-pay | Admitting: Family

## 2024-07-22 DIAGNOSIS — G4733 Obstructive sleep apnea (adult) (pediatric): Secondary | ICD-10-CM | POA: Diagnosis not present

## 2024-07-22 DIAGNOSIS — E119 Type 2 diabetes mellitus without complications: Secondary | ICD-10-CM

## 2024-07-23 DIAGNOSIS — G4733 Obstructive sleep apnea (adult) (pediatric): Secondary | ICD-10-CM | POA: Diagnosis not present

## 2024-07-25 ENCOUNTER — Ambulatory Visit: Admitting: Family

## 2024-08-01 ENCOUNTER — Ambulatory Visit: Admitting: Family

## 2024-08-08 ENCOUNTER — Ambulatory Visit
Admission: RE | Admit: 2024-08-08 | Discharge: 2024-08-08 | Disposition: A | Discharge status: Home / Self Care | Source: Ambulatory Visit | Attending: Family | Admitting: Family

## 2024-08-08 DIAGNOSIS — Z1231 Encounter for screening mammogram for malignant neoplasm of breast: Secondary | ICD-10-CM | POA: Diagnosis not present

## 2024-08-14 ENCOUNTER — Ambulatory Visit: Admitting: Family

## 2024-08-22 DIAGNOSIS — G4733 Obstructive sleep apnea (adult) (pediatric): Secondary | ICD-10-CM | POA: Diagnosis not present

## 2024-08-27 ENCOUNTER — Ambulatory Visit: Admitting: Family

## 2024-09-04 ENCOUNTER — Ambulatory Visit: Payer: Self-pay | Admitting: Family

## 2024-09-04 ENCOUNTER — Ambulatory Visit: Admitting: Family

## 2024-09-04 ENCOUNTER — Encounter: Payer: Self-pay | Admitting: Family

## 2024-09-04 VITALS — BP 126/76 | HR 86 | Temp 98.3°F | Ht 63.5 in | Wt 220.4 lb

## 2024-09-04 DIAGNOSIS — Z7985 Long-term (current) use of injectable non-insulin antidiabetic drugs: Secondary | ICD-10-CM | POA: Diagnosis not present

## 2024-09-04 DIAGNOSIS — E785 Hyperlipidemia, unspecified: Secondary | ICD-10-CM

## 2024-09-04 DIAGNOSIS — L739 Follicular disorder, unspecified: Secondary | ICD-10-CM

## 2024-09-04 DIAGNOSIS — B3731 Acute candidiasis of vulva and vagina: Secondary | ICD-10-CM

## 2024-09-04 DIAGNOSIS — Z23 Encounter for immunization: Secondary | ICD-10-CM

## 2024-09-04 DIAGNOSIS — E119 Type 2 diabetes mellitus without complications: Secondary | ICD-10-CM | POA: Diagnosis not present

## 2024-09-04 LAB — POCT GLYCOSYLATED HEMOGLOBIN (HGB A1C): Hemoglobin A1C: 5.8 % — AB (ref 4.0–5.6)

## 2024-09-04 MED ORDER — FLUCONAZOLE 150 MG PO TABS: ORAL_TABLET | ORAL | 0 refills | Status: AC

## 2024-09-04 MED ORDER — TRIAMCINOLONE ACETONIDE 0.025 % EX OINT: 1.0000 | TOPICAL_OINTMENT | Freq: Two times a day (BID) | CUTANEOUS | 0 refills | Status: AC

## 2024-09-04 MED ORDER — MUPIROCIN 2 % EX OINT: 1.0000 | TOPICAL_OINTMENT | Freq: Two times a day (BID) | CUTANEOUS | 2 refills | Status: AC

## 2024-09-04 MED ORDER — TIRZEPATIDE 5 MG/0.5ML ~~LOC~~ SOAJ: 5.0000 mg | SUBCUTANEOUS | 3 refills | Status: AC

## 2024-09-04 NOTE — Patient Instructions (Signed)
 Trial of triamcinolone  and bactroban for bacterial folliculitis.   If doesn't resolve, take diflucan  150mg  to treat for yeast  Let me know if doesn't resolve.

## 2024-09-04 NOTE — Assessment & Plan Note (Signed)
 Lab Results  Component Value Date   LDLCALC 48 12/20/2023  Chronic, stable.  Continue crestor  5mg  every day.

## 2024-09-04 NOTE — Assessment & Plan Note (Signed)
 Chronic, stable. Increase mounjaro  to 5mg  for additional benefit of weight loss. Monitor for GI side effects.

## 2024-09-04 NOTE — Assessment & Plan Note (Signed)
 D/D includes irritant ( shaving) v bacterial folliculitis. Provided triamcinolone , bactroban. If doesn't resolve, advised to use Diflucan  ( which she keeps on hand) to treat for yeast component. She will let me know how she is doing.

## 2024-09-04 NOTE — Progress Notes (Signed)
 Assessment & Plan:  Diabetes mellitus without complication (HCC) Assessment & Plan: Chronic, stable. Increase mounjaro  to 5mg  for additional benefit of weight loss. Monitor for GI side effects.   Orders: -     Tirzepatide ; Inject 5 mg into the skin once a week.  Dispense: 6 mL; Refill: 3  Yeast vaginitis -     Fluconazole ; [name], Jaime Tate take one tablet PO 3 days later.  Dispense: 2 tablet; Refill: 0  Folliculitis Assessment & Plan: D/D includes irritant ( shaving) v bacterial folliculitis. Provided triamcinolone , bactroban. If doesn't resolve, advised to use Diflucan  ( which she keeps on hand) to treat for yeast component. She will let me know how she is doing.  Orders: -     Triamcinolone  Acetonide; Apply 1 Application topically 2 (two) times daily.  Dispense: 30 g; Refill: 0 -     Mupirocin; Apply 1 Application topically 2 (two) times daily.  Dispense: 22 g; Refill: 2  Hyperlipidemia, unspecified hyperlipidemia type Assessment & Plan: Lab Results  Component Value Date   LDLCALC 48 12/20/2023  Chronic, stable.  Continue crestor  5mg  every day.       Return precautions given.   Risks, benefits, and alternatives of the medications and treatment plan prescribed today were discussed, and patient expressed understanding.   Education regarding symptom management and diagnosis given to patient on AVS either electronically or printed.  No follow-ups on file.  [name], FNP  Subjective:    Patient ID: Jaime Tate, female    DOB: [birth year], 48 y.o.   MRN: [id number]  CC: Jaime Tate is a 48 y.o. female who presents today for follow up.   HPI: HPI Discussed the use of AI scribe software for clinical note transcription with the patient, who gave verbal consent to proceed.  History of Present Illness   Jaime Tate is a 48 year old female with type 2 diabetes who presents for follow-up and medication management.  She has a  history of type 2 diabetes and is currently on a low dose of Mounjaro . Previously, she was on Ozempic  but experienced significant gastrointestinal side effects at higher doses. She maintains her weight around 220 pounds, having initially lost weight on Ozempic .   She developed a new rash under her right arm yesterday, described as itchy. She shaves under her arms.       Allergies: Biaxin [clarithromycin] Current Outpatient Medications on File Prior to Visit  Medication Sig Dispense Refill   Acetaminophen (TYLENOL PO) Take by mouth.     Cholecalciferol (VITAMIN D3) 50 MCG (2000 UT) capsule Take 2,000 Units by mouth daily.     escitalopram  (LEXAPRO ) 10 MG tablet TAKE 1 TABLET BY MOUTH EVERY DAY 90 tablet 2   fexofenadine (ALLEGRA) 60 MG tablet Take 60 mg by mouth daily.     hydrocortisone  2.5 % cream Use as directed. Instruction in patient handout 30 g 3   meloxicam  (MOBIC ) 7.5 MG tablet TAKE 1 TABLET BY MOUTH EVERY DAY AS NEEDED FOR PAIN 90 tablet 0   Misc Natural Products (ELDERBERRY ZINC/VIT C/IMMUNE MT) Use as directed in the mouth or throat as needed.     Omega 3 1000 MG CAPS Take 2,000 mg by mouth daily.     ondansetron  (ZOFRAN -ODT) 4 MG disintegrating tablet Take 1 tablet (4 mg total) by mouth every 8 (eight) hours as needed for nausea or vomiting. 20 tablet 0   pantoprazole  (PROTONIX ) 20 MG tablet  TAKE 1 TABLET (20 MG TOTAL) BY MOUTH EVERY MORNING. TAKE 30 MINUTES TO HOUR BEFORE BREAKFAST 90 tablet 3   Probiotic Product (PROBIOTIC ADVANCED PO) Take by mouth.     rosuvastatin  (CRESTOR ) 5 MG tablet TAKE 1 TABLET BY MOUTH EVERY DAY IN THE EVENING 90 tablet 3   No current facility-administered medications on file prior to visit.    Review of Systems  Constitutional:  Negative for chills and fever.  Respiratory:  Negative for cough.   Cardiovascular:  Negative for chest pain and palpitations.  Gastrointestinal:  Negative for nausea and vomiting.  Skin:  Positive for rash.       Objective:    BP 126/76   Pulse 86   Temp 98.3 F (36.8 C) (Oral)   Ht 5' 3.5 (1.613 m)   Wt 220 lb 6.4 oz (100 kg)   LMP  (LMP Unknown)   SpO2 99%   BMI 38.43 kg/m  BP Readings from Last 3 Encounters:  09/04/24 126/76  04/22/24 128/76  03/14/24 124/86   Wt Readings from Last 3 Encounters:  09/04/24 220 lb 6.4 oz (100 kg)  04/22/24 220 lb (99.8 kg)  03/14/24 225 lb 3.2 oz (102.2 kg)    Physical Exam Vitals reviewed.  Constitutional:      Appearance: She is well-developed.  Eyes:     Conjunctiva/sclera: Conjunctivae normal.  Cardiovascular:     Rate and Rhythm: Normal rate and regular rhythm.     Pulses: Normal pulses.     Heart sounds: Normal heart sounds.  Pulmonary:     Effort: Pulmonary effort is normal.     Breath sounds: Normal breath sounds. No wheezing, rhonchi or rales.  Skin:    General: Skin is warm and dry.     Comments: Erythematous confluent maculopapular rash right axilla and right groin crease. No vesicular lesions, purulent discharge.  Neurological:     Mental Status: She is alert.  Psychiatric:        Speech: Speech normal.        Behavior: Behavior normal.        Thought Content: Thought content normal.

## 2024-10-07 DIAGNOSIS — F411 Generalized anxiety disorder: Secondary | ICD-10-CM | POA: Diagnosis not present

## 2024-10-12 ENCOUNTER — Other Ambulatory Visit: Payer: Self-pay | Admitting: Family

## 2024-10-12 DIAGNOSIS — K2100 Gastro-esophageal reflux disease with esophagitis, without bleeding: Secondary | ICD-10-CM

## 2024-10-14 DIAGNOSIS — F411 Generalized anxiety disorder: Secondary | ICD-10-CM | POA: Diagnosis not present

## 2024-10-24 DIAGNOSIS — F411 Generalized anxiety disorder: Secondary | ICD-10-CM | POA: Diagnosis not present

## 2024-10-24 DIAGNOSIS — G4733 Obstructive sleep apnea (adult) (pediatric): Secondary | ICD-10-CM | POA: Diagnosis not present

## 2024-11-05 DIAGNOSIS — F411 Generalized anxiety disorder: Secondary | ICD-10-CM | POA: Diagnosis not present

## 2024-11-13 ENCOUNTER — Other Ambulatory Visit: Payer: Self-pay | Admitting: Family

## 2024-11-13 DIAGNOSIS — E785 Hyperlipidemia, unspecified: Secondary | ICD-10-CM

## 2024-11-19 DIAGNOSIS — F411 Generalized anxiety disorder: Secondary | ICD-10-CM | POA: Diagnosis not present

## 2024-12-23 ENCOUNTER — Ambulatory Visit (HOSPITAL_BASED_OUTPATIENT_CLINIC_OR_DEPARTMENT_OTHER)

## 2024-12-23 ENCOUNTER — Encounter (HOSPITAL_BASED_OUTPATIENT_CLINIC_OR_DEPARTMENT_OTHER): Payer: Self-pay

## 2024-12-23 VITALS — BP 109/66 | HR 71 | Ht 64.0 in | Wt 213.0 lb

## 2024-12-23 DIAGNOSIS — G4733 Obstructive sleep apnea (adult) (pediatric): Secondary | ICD-10-CM | POA: Diagnosis not present

## 2024-12-23 NOTE — Progress Notes (Signed)
 Jaime Tate

## 2024-12-23 NOTE — Progress Notes (Signed)
 "  @Patient  ID: Jaime Tate, female    DOB: 05-28-1976, 49 y.o.   MRN: [id number]  Chief Complaint  Patient presents with   Sleep Apnea    Follow up     Referring provider: Dineen Rollene MATSU, FNP  HPI: Discussed the use of AI scribe software for clinical note transcription with the patient, who gave verbal consent to proceed.  History of Present Illness Jaime Tate is a 49 year old female with sleep apnea who presents for a follow-up visit.  She has been experiencing inconsistent use of her CPAP machine due to nausea associated with her GLP-1 medication, Tirzepatide , which she started approximately a year ago. Nausea is particularly prominent a few days after her weekly injection, making it difficult to use the CPAP mask. Despite this, she remains compliant on other days, using the CPAP for over six hours per night.  Since starting Tirzepatide , she has lost 30 pounds, which she feels has improved her overall well-being. On days she skips CPAP use due to nausea, she does not experience the headaches and extreme tiredness she had before starting CPAP therapy.  Her CPAP mask has started to leak more frequently since her weight loss. She currently uses a small mask but is unsure if a smaller size is available. She previously used a full face mask but switched to a nasal mask due to dry eyes, which was exacerbated by contact lens use. She has since stopped wearing contacts due to astigmatism issues and is open to trying a full face mask again.  Her original sleep study showed 16.4 events per hour, and her current report shows 8.5 events per hour. She is currently using an auto-adjusting CPAP with pressures averaging between 10 and 11 cm H2O.   Last OV 10/19/2023: 10/19/2023 Follow up : OSA  Patient presents for a follow-up for sleep apnea.  Last seen January 2024.  Patient has moderate obstructive sleep apnea.  Is on nocturnal CPAP.  Patient says she is doing well on CPAP.  Says that  she feels she benefits from CPAP with decreased daytime sleepiness.  CPAP download shows excellent compliance with 100% usage.  Daily average usage at 7.5 hours.  Patient is on auto CPAP 5 to 13 cm H2O.  AHI 7.1/hour.  Daily average pressure of 12.5 cm H2O.  She changed to the Dreamwear full face mask, really likes it better.     TEST/EVENTS :  August 16, 2022 that showed moderate sleep apnea with AHI at 16.4/hour and SPO2 low at 74%   Allergies[1]  Immunization History  Administered Date(s) Administered   Influenza, Seasonal, Injecte, Preservative Fre 09/04/2024   Influenza,inj,Quad PF,6+ Mos 01/04/2016, 09/12/2016, 10/10/2018, 09/05/2019, 09/27/2020, 08/28/2021   Influenza-Unspecified 09/09/2017, 09/15/2018, 08/29/2023   Moderna Covid-19 Fall Seasonal Vaccine 60yrs & older 12/12/2022   Novel Infuenza-h1n1-09 11/06/2008   PFIZER(Purple Top)SARS-COV-2 Vaccination 02/19/2020, 03/11/2020, 10/09/2020   Pfizer Covid-19 Vaccine Bivalent Booster 56yrs & up 08/28/2021   Pfizer(Comirnaty)Fall Seasonal Vaccine 12 years and older 08/29/2023   Tdap 10/01/2019, 02/20/2024    Past Medical History:  Diagnosis Date   Allergy    Seasonal   Anxiety    Chicken pox    GERD (gastroesophageal reflux disease)    Vaginal delivery     Tobacco History: Tobacco Use History[2] Counseling given: Not Answered   Outpatient Medications Prior to Visit  Medication Sig Dispense Refill   Acetaminophen (TYLENOL PO) Take by mouth.     Cholecalciferol (VITAMIN D3) 50 MCG (2000  UT) capsule Take 2,000 Units by mouth daily.     escitalopram  (LEXAPRO ) 10 MG tablet TAKE 1 TABLET BY MOUTH EVERY DAY 90 tablet 2   fexofenadine (ALLEGRA) 60 MG tablet Take 60 mg by mouth daily.     fluconazole  (DIFLUCAN ) 150 MG tablet [name], Lien take one tablet PO 3 days later. 2 tablet 0   hydrocortisone  2.5 % cream Use as directed. Instruction in patient handout 30 g 3   meloxicam  (MOBIC ) 7.5 MG  tablet TAKE 1 TABLET BY MOUTH EVERY DAY AS NEEDED FOR PAIN 90 tablet 0   Misc Natural Products (ELDERBERRY ZINC/VIT C/IMMUNE MT) Use as directed in the mouth or throat as needed.     mupirocin  ointment (BACTROBAN ) 2 % Apply 1 Application topically 2 (two) times daily. 22 g 2   Omega 3 1000 MG CAPS Take 2,000 mg by mouth daily.     ondansetron  (ZOFRAN -ODT) 4 MG disintegrating tablet Take 1 tablet (4 mg total) by mouth every 8 (eight) hours as needed for nausea or vomiting. 20 tablet 0   pantoprazole  (PROTONIX ) 20 MG tablet TAKE 1 TABLET (20 MG TOTAL) BY MOUTH EVERY MORNING. TAKE 30 MINUTES TO HOUR BEFORE BREAKFAST 90 tablet 3   Probiotic Product (PROBIOTIC ADVANCED PO) Take by mouth.     rosuvastatin  (CRESTOR ) 5 MG tablet TAKE 1 TABLET BY MOUTH EVERY DAY IN THE EVENING 90 tablet 3   tirzepatide  (MOUNJARO ) 5 MG/0.5ML Pen Inject 5 mg into the skin once a week. 6 mL 3   triamcinolone  (KENALOG ) 0.025 % ointment Apply 1 Application topically 2 (two) times daily. 30 g 0   No facility-administered medications prior to visit.     Review of Systems: as per hpi  Constitutional:   No  weight loss, night sweats,  Fevers, chills, fatigue, or  lassitude.  HEENT:   No headaches,  Difficulty swallowing,  Tooth/dental problems, or  Sore throat,                No sneezing, itching, ear ache, nasal congestion, post nasal drip,   CV:  No chest pain,  Orthopnea, PND, swelling in lower extremities, anasarca, dizziness, palpitations, syncope.   GI  No heartburn, indigestion, abdominal pain, nausea, vomiting, diarrhea, change in bowel habits, loss of appetite, bloody stools.   Resp: No shortness of breath with exertion or at rest.  No excess mucus, no productive cough,  No non-productive cough,  No coughing up of blood.  No change in color of mucus.  No wheezing.  No chest wall deformity  Skin: no rash or lesions.  GU: no dysuria, change in color of urine, no urgency or frequency.  No flank pain, no hematuria    MS:  No joint pain or swelling.  No decreased range of motion.  No back pain.    Physical Exam  BP 109/66   Pulse 71   Ht 5' 4 (1.626 m)   Wt 213 lb (96.6 kg)   SpO2 99%   BMI 36.56 kg/m   GEN: A/Ox3; pleasant , NAD, well nourished    HEENT:  [place]/AT,  EACs-clear, TMs-wnl, NOSE-clear, THROAT-clear, no lesions, no postnasal drip or exudate noted. Mallampati 2  NECK:  Supple w/ fair ROM; no JVD; normal carotid impulses w/o bruits; no thyromegaly or nodules palpated; no lymphadenopathy.    RESP  Clear  P & A; w/o, wheezes/ rales/ or rhonchi. no accessory muscle use, no dullness to percussion  CARD:  RRR, no m/r/g, no peripheral edema, pulses intact, no cyanosis or clubbing.  GI:   Soft & nt; nml bowel sounds; no organomegaly or masses detected.   Musco: Warm bil, no deformities or joint swelling noted.   Neuro: alert, no focal deficits noted.    Skin: Warm, no lesions or rashes    Lab Results:  CBC    Component Value Date/Time   WBC 9.5 02/04/2022 1010   RBC 5.35 (H) 02/04/2022 1010   HGB 13.5 02/04/2022 1010   HCT 42.5 02/04/2022 1010   PLT 364.0 02/04/2022 1010   MCV 79.4 02/04/2022 1010   MCH 25.8 (L) 12/17/2021 1614   MCHC 31.8 02/04/2022 1010   RDW 16.0 (H) 02/04/2022 1010   LYMPHSABS 3.5 02/04/2022 1010   MONOABS 0.4 02/04/2022 1010   EOSABS 0.2 02/04/2022 1010   BASOSABS 0.0 02/04/2022 1010    BMET    Component Value Date/Time   NA 138 12/20/2023 1225   NA 139 12/09/2014 0000   K 4.1 12/20/2023 1225   CL 103 12/20/2023 1225   CO2 28 12/20/2023 1225   GLUCOSE 97 12/20/2023 1225   BUN 8 12/20/2023 1225   BUN 11 12/09/2014 0000   CREATININE 0.72 12/20/2023 1225   CREATININE 0.74 02/26/2021 1421   CALCIUM  9.5 12/20/2023 1225    BNP No results found for: BNP  ProBNP No results found for: PROBNP  Imaging: No results found.  Administration History     None           No data to display          No results found for:  NITRICOXIDE   Assessment & Plan:   Assessment & Plan OSA (obstructive sleep apnea)  Assessment and Plan Assessment & Plan Obstructive sleep apnea Intermittent CPAP compliance due to nausea from GLP-1 medication. Weight loss improved symptoms and caused mask fit issues. Current settings effective, reducing events from 16.4 to 8.5 per hour; likely would improve with a better mask fit and compliance. - Contact [employer] for mask fitting and trial of different masks. - Continue current CPAP settings and monitor for improvement with mask adjustment. - Encourage CPAP compliance and continued healthy weight loss. - Schedule follow-up in one year or sooner if issues persist.    Return in about 1 year (around 12/23/2025) for compliance.  Candis Dandy, PA-C 12/23/2024      [1]  Allergies Allergen Reactions   Biaxin [Clarithromycin] Diarrhea and Nausea And Vomiting  [2]  Social History Tobacco Use  Smoking Status Never  Smokeless Tobacco Never   "

## 2024-12-23 NOTE — Patient Instructions (Signed)
 Continue nightly CPAP with goal of at least 4-6 hours of usage.  Mask fitting ordered today.  Follow up in 1 year- sooner if new issues arise.

## 2025-02-21 ENCOUNTER — Encounter: Admitting: Family
